# Patient Record
Sex: Female | Born: 1965
Health system: Southern US, Community
[De-identification: ages and names within clinical notes are randomized; demographics above are authoritative.]

## PROBLEM LIST (undated history)

## (undated) DIAGNOSIS — S31809A Unspecified open wound of unspecified buttock, initial encounter: Secondary | ICD-10-CM

## (undated) DIAGNOSIS — T7840XA Allergy, unspecified, initial encounter: Secondary | ICD-10-CM

## (undated) DIAGNOSIS — K219 Gastro-esophageal reflux disease without esophagitis: Secondary | ICD-10-CM

## (undated) DIAGNOSIS — E785 Hyperlipidemia, unspecified: Secondary | ICD-10-CM

## (undated) DIAGNOSIS — F419 Anxiety disorder, unspecified: Secondary | ICD-10-CM

## (undated) DIAGNOSIS — I10 Essential (primary) hypertension: Secondary | ICD-10-CM

## (undated) DIAGNOSIS — R51 Headache: Secondary | ICD-10-CM

## (undated) DIAGNOSIS — D509 Iron deficiency anemia, unspecified: Secondary | ICD-10-CM

## (undated) HISTORY — DX: Essential (primary) hypertension: I10

## (undated) HISTORY — PX: BREAST EXCISIONAL BIOPSY: SUR124

## (undated) HISTORY — PX: BREAST SURGERY: SHX581

## (undated) HISTORY — DX: Gastro-esophageal reflux disease without esophagitis: K21.9

## (undated) HISTORY — PX: TUBAL LIGATION: SHX77

## (undated) HISTORY — PX: LAPAROSCOPIC ENDOMETRIOSIS FULGURATION: SUR769

## (undated) HISTORY — PX: CYST EXCISION: SHX5701

## (undated) HISTORY — DX: Hyperlipidemia, unspecified: E78.5

## (undated) HISTORY — PX: BREAST CYST EXCISION: SHX579

## (undated) HISTORY — DX: Allergy, unspecified, initial encounter: T78.40XA

## (undated) HISTORY — PX: POLYPECTOMY: SHX149

## (undated) HISTORY — DX: Iron deficiency anemia, unspecified: D50.9

## (undated) HISTORY — DX: Anxiety disorder, unspecified: F41.9

---

## 1997-05-14 ENCOUNTER — Encounter: Admission: RE | Admit: 1997-05-14 | Discharge: 1997-08-12 | Payer: Self-pay | Admitting: Internal Medicine

## 1998-05-26 ENCOUNTER — Emergency Department (HOSPITAL_COMMUNITY): Admission: EM | Admit: 1998-05-26 | Discharge: 1998-05-26 | Payer: Self-pay | Admitting: Emergency Medicine

## 1998-05-27 ENCOUNTER — Encounter: Payer: Self-pay | Admitting: Emergency Medicine

## 1998-05-27 ENCOUNTER — Ambulatory Visit (HOSPITAL_COMMUNITY): Admission: RE | Admit: 1998-05-27 | Discharge: 1998-05-27 | Payer: Self-pay | Admitting: Emergency Medicine

## 1998-11-06 ENCOUNTER — Other Ambulatory Visit: Admission: RE | Admit: 1998-11-06 | Discharge: 1998-11-06 | Payer: Self-pay | Admitting: Nephrology

## 1999-05-16 ENCOUNTER — Encounter: Payer: Self-pay | Admitting: Internal Medicine

## 1999-05-16 ENCOUNTER — Ambulatory Visit (HOSPITAL_COMMUNITY): Admission: RE | Admit: 1999-05-16 | Discharge: 1999-05-16 | Payer: Self-pay | Admitting: Internal Medicine

## 2000-03-26 ENCOUNTER — Ambulatory Visit (HOSPITAL_COMMUNITY): Admission: RE | Admit: 2000-03-26 | Discharge: 2000-03-26 | Payer: Self-pay | Admitting: Internal Medicine

## 2000-03-26 ENCOUNTER — Encounter: Payer: Self-pay | Admitting: Internal Medicine

## 2000-03-29 ENCOUNTER — Ambulatory Visit (HOSPITAL_COMMUNITY): Admission: RE | Admit: 2000-03-29 | Discharge: 2000-03-29 | Payer: Self-pay | Admitting: Internal Medicine

## 2001-04-15 ENCOUNTER — Other Ambulatory Visit: Admission: RE | Admit: 2001-04-15 | Discharge: 2001-04-15 | Payer: Self-pay | Admitting: Internal Medicine

## 2001-04-20 ENCOUNTER — Encounter: Admission: RE | Admit: 2001-04-20 | Discharge: 2001-05-20 | Payer: Self-pay | Admitting: Occupational Medicine

## 2001-05-19 ENCOUNTER — Emergency Department (HOSPITAL_COMMUNITY): Admission: EM | Admit: 2001-05-19 | Discharge: 2001-05-19 | Payer: Self-pay | Admitting: Emergency Medicine

## 2002-04-21 ENCOUNTER — Other Ambulatory Visit: Admission: RE | Admit: 2002-04-21 | Discharge: 2002-04-21 | Payer: Self-pay | Admitting: Internal Medicine

## 2002-05-03 ENCOUNTER — Encounter: Admission: RE | Admit: 2002-05-03 | Discharge: 2002-05-03 | Payer: Self-pay | Admitting: Internal Medicine

## 2002-05-03 ENCOUNTER — Encounter: Payer: Self-pay | Admitting: Internal Medicine

## 2004-03-03 ENCOUNTER — Other Ambulatory Visit: Admission: RE | Admit: 2004-03-03 | Discharge: 2004-03-03 | Payer: Self-pay | Admitting: Internal Medicine

## 2004-03-28 ENCOUNTER — Other Ambulatory Visit: Admission: RE | Admit: 2004-03-28 | Discharge: 2004-03-28 | Payer: Self-pay | Admitting: Gynecology

## 2004-06-25 ENCOUNTER — Encounter (INDEPENDENT_AMBULATORY_CARE_PROVIDER_SITE_OTHER): Payer: Self-pay | Admitting: Specialist

## 2004-06-25 ENCOUNTER — Ambulatory Visit (HOSPITAL_COMMUNITY): Admission: RE | Admit: 2004-06-25 | Discharge: 2004-06-25 | Payer: Self-pay | Admitting: Gynecology

## 2004-06-25 HISTORY — PX: HYSTEROSCOPY WITH D & C: SHX1775

## 2004-09-17 ENCOUNTER — Other Ambulatory Visit: Admission: RE | Admit: 2004-09-17 | Discharge: 2004-09-17 | Payer: Self-pay | Admitting: Gynecology

## 2005-03-23 ENCOUNTER — Ambulatory Visit (HOSPITAL_COMMUNITY): Admission: RE | Admit: 2005-03-23 | Discharge: 2005-03-23 | Payer: Self-pay | Admitting: Gynecology

## 2005-05-07 ENCOUNTER — Ambulatory Visit (HOSPITAL_COMMUNITY): Admission: RE | Admit: 2005-05-07 | Discharge: 2005-05-07 | Payer: Self-pay | Admitting: Internal Medicine

## 2005-05-08 ENCOUNTER — Ambulatory Visit (HOSPITAL_COMMUNITY): Admission: RE | Admit: 2005-05-08 | Discharge: 2005-05-08 | Payer: Self-pay | Admitting: Internal Medicine

## 2007-02-03 ENCOUNTER — Ambulatory Visit: Payer: Self-pay | Admitting: Cardiology

## 2007-02-04 ENCOUNTER — Ambulatory Visit (HOSPITAL_COMMUNITY): Admission: RE | Admit: 2007-02-04 | Discharge: 2007-02-04 | Payer: Self-pay | Admitting: Internal Medicine

## 2007-11-25 ENCOUNTER — Ambulatory Visit: Payer: Self-pay | Admitting: Internal Medicine

## 2007-11-25 ENCOUNTER — Other Ambulatory Visit: Admission: RE | Admit: 2007-11-25 | Discharge: 2007-11-25 | Payer: Self-pay | Admitting: Internal Medicine

## 2008-02-16 ENCOUNTER — Ambulatory Visit: Payer: Self-pay | Admitting: Internal Medicine

## 2008-02-20 ENCOUNTER — Ambulatory Visit: Payer: Self-pay | Admitting: Oncology

## 2008-02-23 ENCOUNTER — Encounter: Admission: RE | Admit: 2008-02-23 | Discharge: 2008-02-23 | Payer: Self-pay | Admitting: Internal Medicine

## 2008-03-01 ENCOUNTER — Ambulatory Visit (HOSPITAL_COMMUNITY): Admission: RE | Admit: 2008-03-01 | Discharge: 2008-03-01 | Payer: Self-pay | Admitting: Internal Medicine

## 2008-03-01 ENCOUNTER — Ambulatory Visit: Payer: Self-pay | Admitting: Internal Medicine

## 2008-05-04 ENCOUNTER — Ambulatory Visit: Payer: Self-pay | Admitting: Internal Medicine

## 2008-06-18 ENCOUNTER — Ambulatory Visit: Payer: Self-pay | Admitting: Oncology

## 2008-06-20 LAB — CBC WITH DIFFERENTIAL/PLATELET
HCT: 29.4 % — ABNORMAL LOW (ref 34.8–46.6)
HGB: 9.7 g/dL — ABNORMAL LOW (ref 11.6–15.9)
LYMPH%: 21.7 % (ref 14.0–49.7)
MONO#: 0.4 10*3/uL (ref 0.1–0.9)
MONO%: 10.2 % (ref 0.0–14.0)
NEUT#: 2.7 10*3/uL (ref 1.5–6.5)
NEUT%: 66.8 % (ref 38.4–76.8)
Platelets: 259 10*3/uL (ref 145–400)
WBC: 4 10*3/uL (ref 3.9–10.3)

## 2008-09-18 ENCOUNTER — Ambulatory Visit: Payer: Self-pay | Admitting: Internal Medicine

## 2008-10-12 ENCOUNTER — Ambulatory Visit: Payer: Self-pay | Admitting: Oncology

## 2008-10-16 LAB — CBC WITH DIFFERENTIAL/PLATELET
Basophils Absolute: 0 10*3/uL (ref 0.0–0.1)
EOS%: 1.7 % (ref 0.0–7.0)
HCT: 36.1 % (ref 34.8–46.6)
HGB: 12.2 g/dL (ref 11.6–15.9)
MONO#: 0.4 10*3/uL (ref 0.1–0.9)
MONO%: 13.2 % (ref 0.0–14.0)
NEUT#: 1.3 10*3/uL — ABNORMAL LOW (ref 1.5–6.5)
NEUT%: 43.7 % (ref 38.4–76.8)
lymph#: 1.2 10*3/uL (ref 0.9–3.3)

## 2008-10-16 LAB — COMPREHENSIVE METABOLIC PANEL
ALT: 8 U/L (ref 0–35)
AST: 11 U/L (ref 0–37)
Alkaline Phosphatase: 39 U/L (ref 39–117)
CO2: 23 mEq/L (ref 19–32)
Chloride: 107 mEq/L (ref 96–112)
Creatinine, Ser: 0.84 mg/dL (ref 0.40–1.20)
Sodium: 137 mEq/L (ref 135–145)
Total Bilirubin: 0.5 mg/dL (ref 0.3–1.2)
Total Protein: 6.3 g/dL (ref 6.0–8.3)

## 2008-10-16 LAB — IRON AND TIBC: UIBC: 279 ug/dL

## 2008-11-30 ENCOUNTER — Ambulatory Visit: Payer: Self-pay | Admitting: Internal Medicine

## 2009-03-14 ENCOUNTER — Ambulatory Visit: Payer: Self-pay | Admitting: Internal Medicine

## 2009-03-14 ENCOUNTER — Encounter: Payer: Self-pay | Admitting: Cardiology

## 2009-03-22 ENCOUNTER — Ambulatory Visit: Payer: Self-pay | Admitting: Cardiology

## 2009-03-22 DIAGNOSIS — R002 Palpitations: Secondary | ICD-10-CM | POA: Insufficient documentation

## 2009-03-22 DIAGNOSIS — I10 Essential (primary) hypertension: Secondary | ICD-10-CM

## 2009-03-22 DIAGNOSIS — J45909 Unspecified asthma, uncomplicated: Secondary | ICD-10-CM | POA: Insufficient documentation

## 2009-03-22 HISTORY — DX: Essential (primary) hypertension: I10

## 2009-03-29 ENCOUNTER — Telehealth: Payer: Self-pay | Admitting: Cardiology

## 2009-03-29 ENCOUNTER — Encounter: Payer: Self-pay | Admitting: Cardiology

## 2009-04-04 ENCOUNTER — Telehealth: Payer: Self-pay | Admitting: Cardiology

## 2009-04-10 ENCOUNTER — Telehealth: Payer: Self-pay | Admitting: Cardiology

## 2009-04-18 ENCOUNTER — Encounter: Payer: Self-pay | Admitting: Cardiology

## 2009-04-19 ENCOUNTER — Ambulatory Visit: Payer: Self-pay | Admitting: Cardiology

## 2009-07-11 ENCOUNTER — Encounter: Admission: RE | Admit: 2009-07-11 | Discharge: 2009-07-11 | Payer: Self-pay | Admitting: Internal Medicine

## 2009-11-04 ENCOUNTER — Emergency Department (HOSPITAL_COMMUNITY): Admission: EM | Admit: 2009-11-04 | Discharge: 2009-11-04 | Payer: Self-pay | Admitting: Family Medicine

## 2009-11-08 ENCOUNTER — Ambulatory Visit: Payer: Self-pay | Admitting: Internal Medicine

## 2009-12-06 ENCOUNTER — Ambulatory Visit: Payer: Self-pay | Admitting: Internal Medicine

## 2009-12-27 ENCOUNTER — Ambulatory Visit: Payer: Self-pay | Admitting: Internal Medicine

## 2010-02-16 ENCOUNTER — Encounter: Payer: Self-pay | Admitting: Internal Medicine

## 2010-02-25 NOTE — Miscellaneous (Signed)
Summary: MEDS  Clinical Lists Changes  Medications: Added new medication of PRINZIDE 20-12.5 MG TABS (LISINOPRIL-HYDROCHLOROTHIAZIDE) 1 TAB once daily Added new medication of NORVASC 5 MG TABS (AMLODIPINE BESYLATE) 1 TAB once daily Added new medication of * ALBUTEROL INHALER AS DIRECTED

## 2010-02-25 NOTE — Progress Notes (Signed)
Summary: Patients At Home Vitals   Patients At Home Vitals   Imported By: Roderic Ovens 07/24/2009 14:33:13  _____________________________________________________________________  External Attachment:    Type:   Image     Comment:   External Document

## 2010-02-25 NOTE — Assessment & Plan Note (Signed)
Summary: bp check  Nurse Visit   Vital Signs:  Patient profile:   45 year old female Pulse rate:   58 / minute Pulse rhythm:   regular BP sitting:   142 / 98  (left arm) Cuff size:   large  CC:  The pt is here for a BP f/u. She reports her bp on 3/1 was 148/90 and 3/2 140/92/ 3/3 132/90. She c/o a headache today- but she states she just found out her father has recurrent cancer. I discussed this with Dr. Juanda Chance- orders received for the pt to stop norvasc and start labetolol 200mg  bid - bp check in 1 week..   Allergies: 1)  ! * Latex Prescriptions: LABETALOL HCL 200 MG TABS (LABETALOL HCL) one tab by mouth two times a day  #180 x 3   Entered by:   Sherri Rad, RN, BSN   Authorized by:   Lenoria Farrier, MD, [facility]   Signed by:   Sherri Rad, RN, BSN on 03/29/2009   Method used:   Electronically to        [facility]* (retail)       [address]       [address] Shipping/mailing       [place], [place]  [address]       Ph: [phone]       Fax: [phone]   RxID:   [id number]    Patient Instructions: 1)  Your physician has recommended you make the following change in your medication: 1) STOP norvasc, 2) Start Labetolol 200mg  two times a day. 2)  You need to come back next week for another bp check.

## 2010-02-25 NOTE — Assessment & Plan Note (Signed)
Summary: np6/dx:hypertention   Visit Type:  Initial Consult Primary Provider:  Dr Lenord Fellers  CC:  hypertension.  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 Her husband also works at [employer] as a tech with the sleep studies. She is referred by Dr. Lenord Fellers for management of hypertension in the setting of her trying to get pregnant.  She has a history of hypertension for a number of years. She also has a history of palpitations. She was evaluated with an echocardiogram at [facility] in January 2009 which was normal except for mild LVH. She had a Holter monitor which showed isolated PVCs and APCs and one short run of SVT.  She had been on labetalol and Aldomet in the past when there was concern about her trying to get pregnant. This was not thought to be an issue recently and she has been treated with Norvasc 5 mg daily and Prinzide 20-12.5 mg daily. She comes in today to get off these drugs which potentially could be toxic to the fetus if she gets pregnant and began on alternative drugs it won't affect her pregnancy.  She has had no chest pain or shortness of breath.  Problems Prior to Update: 1)  Iron Deficiency Anemia  (ICD-280.9) 2)  Benign Neutropenia  () 3)  Asthma, Unspecified, Unspecified Status  (ICD-493.90)  Current Medications (verified): 1)  Prinzide 20-12.5 Mg Tabs (Lisinopril-Hydrochlorothiazide) .Marland Kitchen.. 1 Tab Once Daily 2)  Norvasc 5 Mg Tabs (Amlodipine Besylate) .Marland Kitchen.. 1 Tab Once Daily 3)  Albuterol Inhaler .... As Directed 4)  Advair Inhaler .... As Directed 5)  Allegra 60 Mg Tabs (Fexofenadine Hcl) .... As Needed 6)  Pre Natal Vit .Marland Kitchen.. 1 Tab Once Daily(Anemia)  Allergies (verified): 1)  ! * Latex  Past History:  Past Medical History: hypertension Tubal ligation Removal of cyst from the breast History of asthma but not on any bronchodilators at present.  Family History: both her mother and father  are alive. There is no family history of cardiac disease.  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 She has 2 children by her first marriage age 37 and 73 I believe. She does not smoke.  Review of Systems       ROS is negative except as outlined in HPI.   Vital Signs:  Patient profile:   45 year old female Height:      64 inches Weight:      215 pounds BMI:     37.04 BP sitting:   144 / 101  (left arm) Cuff size:   large  Vitals Entered By: Burnett Kanaris, CNA (March 22, 2009 4:04 PM)  Physical Exam  Additional Exam:  Gen. Well-nourished, in no distress Head: Normocephalic    Eyes: PERRLA/EOM intact; conjunctiva and lids normal Neck: No JVD, thyroid not enlarged, no carotid bruits Lungs: No tachypnea, clear w/o rales, rhonchi or wheezes CV: Rhythm regular, PMI not displaced,  sounds  nl, no murmurs or gallops, no edema, pulses nl UE and LE Abd: BS nl, abd soft and non-tender w/o masses or hepatosplenomegaly MS: No deformities Neuro: no focal sns Skin: no lesions Psych: nl affect    Impression & Recommendations:  Problem # 1:  ESSENTIAL HYPERTENSION, BENIGN (ICD-401.1) The main issue is finding medications that will control her hypertension without affecting her potential pregnancy. Candidates for safe drugs include hydralazine, labetalol, and Aldomet.I think it will take at  least 2 drugs to get her blood pressure controlled since it's somewhat elevated on 2 drugs today. Although it's elevated today she says it's been running better at home or in a hospital and she has it checked. We will stop the Prinzide and began a Presalin 25 mg q.i.d. We'll have her keep her record of her blood pressures over the next week and come back for blood pressure check in one week. If her blood pressures are under regional control we will stop the Norvasc and start her on labetalol 200 mg b.i.d. The following medications were removed from the medication list:     Prinzide 20-12.5 Mg Tabs (Lisinopril-hydrochlorothiazide) .Marland Kitchen... 1 tab once daily Her updated medication list for this problem includes:    Norvasc 5 Mg Tabs (Amlodipine besylate) .Marland Kitchen... 1 tab once daily    Hydralazine Hcl 25 Mg Tabs (Hydralazine hcl) .Marland Kitchen... Take one tablet by mouth four times a day  Orders: EKG w/ Interpretation (93000)  Problem # 2:  PALPITATIONS (ICD-785.1) She has had symptoms of palpitations and has documented PVCs and APCs on Holter monitoring and one short run of SVT. I don't think any of this will require treatment although if we add labetalol this may help. She has no major structural heart disease. She has mild LVH on her echocardiogram associated with her hypertension. The following medications were removed from the medication list:    Prinzide 20-12.5 Mg Tabs (Lisinopril-hydrochlorothiazide) .Marland Kitchen... 1 tab once daily Her updated medication list for this problem includes:    Norvasc 5 Mg Tabs (Amlodipine besylate) .Marland Kitchen... 1 tab once daily  Patient Instructions: 1)  Your physician recommends that you schedule a follow-up appointment in: 4 weeks. 2)  Your physician has recommended you make the following change in your medication: 1) STOP Prinizide, 2) Start Hydralazine 25mg  four times daily ( you may take this with meals and at bedtime). 3) Continue Norvasc for 1 week. 3)  You need to see Dr. Regino Schultze nurse, Sherri Rad, for a blood pressure check in 1 week.  `

## 2010-02-25 NOTE — Progress Notes (Signed)
Summary: BP readings  Phone Note Call from Patient Call back at [phone] (cell)/ [phone]   Caller: Patient Summary of Call: The pt called today and stated she would be faxing her bp readings. I have recieved her readings and they are as follows: 03/31/09 @ 2:15pm- 126/94 HR- 47 04/01/09 @ 6:06pm- 126/93 HR- 52 04/02/09 @ 8:30pm- 139/95 HR- 55 04/03/09 @ 6:25pm- 126/92 HR- 64 The pt denies dizziness/ lightheadedness, but states she does feel "funny" sometimes.  The pt is on hydralazine 25mg  qid & we recently started her on labetolol 200mg  two times a day on 03/29/09. I will review with Dr. Juanda Chance and call the pt back with any changes.

## 2010-02-25 NOTE — Consult Note (Signed)
Summary: [facility]  [facility]   Imported By: Marylou Mccoy 03/21/2009 12:40:04  _____________________________________________________________________  External Attachment:    Type:   Image     Comment:   External Document

## 2010-02-25 NOTE — Progress Notes (Signed)
Summary: BP f/u   ---- Converted from flag ---- ---- 04/06/2009 12:28 AM, Lenoria Farrier, MD, [facility] wrote: I think the bp readings are pretty good.  Continue same and give [place] another 2 weeks of readings. BB  ---- 04/04/2009 12:28 PM, Sherri Rad, RN, BSN wrote: Please review the pt's bp readings and comments from 04/04/09 phone note. Advise if any changes. Thanks. ------------------------------  Phone Note Outgoing Call   Call placed by: Sherri Rad, RN, BSN,  April 10, 2009 5:22 PM Call placed to: Patient Summary of Call: I left a message for the pt to call. Sherri Rad, RN, BSN  April 10, 2009 5:22 PM  I spoke with the pt today. I explained that Dr. Juanda Chance had reviewed her BP readings and was pleased with her results. He has encourage her to take her readings for 2 more weeks and call us with the results and how she is feeling. She is agreeable with this.  Initial call taken by: Sherri Rad, RN, BSN,  April 11, 2009 8:50 AM

## 2010-02-25 NOTE — Assessment & Plan Note (Signed)
Summary: f45m   Visit Type:  1 mo f/u Primary Provider:  Dr Lenord Fellers  CC:  BP doing O.K. since last visit.  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 She and her husband, who also works at [employer], had been trying to get pregnant and Dr. Lenord Fellers was having some difficulty controlling her blood pressure so she referred her to [place] for management with drugs that would not be risky if she became pregnant. We transition her drugs to labetalol and hydralazine and she has done quite well with this. She is Blood pressure readings at home and had been in the range of 124 237 systolic and 73-89 diastolic.  Her chief problems are related to weight gain. She is up to 219 pounds which is a weight gain of over 30 pounds. She also has been having irregular periods. Dr. Lenord Fellers recently did blood tests and also a pregnancy test.  Problems Prior to Update: 1)  Palpitations  (ICD-785.1) 2)  Essential Hypertension, Benign  (ICD-401.1) 3)  Iron Deficiency Anemia  (ICD-280.9) 4)  Benign Neutropenia  () 5)  Asthma, Unspecified, Unspecified Status  (ICD-493.90)  Current Medications (verified): 1)  Labetalol Hcl 200 Mg Tabs (Labetalol Hcl) .... One Tab By Mouth Two Times A Day 2)  Albuterol Inhaler .... As Directed 3)  Advair Inhaler .... As Directed 4)  Allegra 60 Mg Tabs (Fexofenadine Hcl) .... As Needed 5)  Pre Natal Vit .Marland Kitchen.. 1 Tab Once Daily(Anemia) 6)  Hydralazine Hcl 25 Mg Tabs (Hydralazine Hcl) .... Take One Tablet By Mouth Four Times A Day  Allergies: 1)  ! * Latex  Past History:  Past Medical History: Reviewed history from 03/22/2009 and no changes required. hypertension Tubal ligation Removal of cyst from the breast History of asthma but not on any bronchodilators at present.  Review of Systems       ROS is negative except as outlined in HPI.   Vital Signs:  Patient profile:   45 year old  female Height:      64 inches Weight:      219 pounds Pulse rate:   61 / minute BP sitting:   135 / 80  (left arm) Cuff size:   large  Vitals Entered By: Oswald Hillock (April 19, 2009 4:16 PM)  Physical Exam  Additional Exam:  Gen. Well-nourished, in no distress   Neck: No JVD, thyroid not enlarged, no carotid bruits Lungs: No tachypnea, clear without rales, rhonchi or wheezes Cardiovascular: Rhythm regular, PMI not displaced,  heart sounds  normal, no murmurs or gallops, no peripheral edema, pulses normal in all 4 extremities. Abdomen: BS normal, abdomen soft and non-tender without masses or organomegaly, no hepatosplenomegaly. MS: No deformities, no cyanosis or clubbing   Neuro:  No focal sns   Skin:  no lesions    Impression & Recommendations:  Problem # 1:  ESSENTIAL HYPERTENSION, BENIGN (ICD-401.1) her blood pressure now appears well controlled on her current medications and we will continue her current treatment.  She has concerns about her weight gain and her irregular periods and I suggested that she continue to work with Dr. Lenord Fellers on these problems.  Her updated medication list for this problem includes:    Labetalol Hcl 200 Mg Tabs (Labetalol hcl) ..... One tab by mouth two times a day    Hydralazine Hcl 25 Mg Tabs (Hydralazine hcl) .Marland Kitchen... Take one tablet by mouth four times a  day  Her updated medication list for this problem includes:    Labetalol Hcl 200 Mg Tabs (Labetalol hcl) ..... One tab by mouth two times a day    Hydralazine Hcl 25 Mg Tabs (Hydralazine hcl) .Marland Kitchen... Take one tablet by mouth four times a day  Patient Instructions: 1)  We will see you back on an as needed basis.

## 2010-02-25 NOTE — Progress Notes (Signed)
Summary: PT HAVE COLD WANT TO KNOW  WHAT SHE CAN TAKE FOR IT  Phone Note Call from Patient Call back at [phone]   Summary of Call: PT FEEL LIKE SHE IS GETTING SICK (COLD) WANT TO KNOW WHAT SHE CAN TAKE. Initial call taken by: Judie Grieve,  March 29, 2009 11:34 AM  Follow-up for Phone Call        I explained to the pt that she can take Coricidin for her symptoms.  Follow-up by: Sherri Rad, RN, BSN,  March 29, 2009 12:59 PM

## 2010-04-03 ENCOUNTER — Ambulatory Visit (INDEPENDENT_AMBULATORY_CARE_PROVIDER_SITE_OTHER): Payer: Commercial Managed Care - PPO | Admitting: Internal Medicine

## 2010-04-03 DIAGNOSIS — L02239 Carbuncle of trunk, unspecified: Secondary | ICD-10-CM

## 2010-04-03 DIAGNOSIS — J45909 Unspecified asthma, uncomplicated: Secondary | ICD-10-CM

## 2010-04-03 DIAGNOSIS — J069 Acute upper respiratory infection, unspecified: Secondary | ICD-10-CM

## 2010-06-06 ENCOUNTER — Ambulatory Visit (INDEPENDENT_AMBULATORY_CARE_PROVIDER_SITE_OTHER): Payer: Commercial Managed Care - PPO | Admitting: Internal Medicine

## 2010-06-06 ENCOUNTER — Encounter: Payer: Self-pay | Admitting: Internal Medicine

## 2010-06-06 VITALS — BP 118/80 | HR 72 | Temp 98.2°F | Ht 68.0 in | Wt 198.0 lb

## 2010-06-06 DIAGNOSIS — Z0000 Encounter for general adult medical examination without abnormal findings: Secondary | ICD-10-CM

## 2010-06-06 LAB — POCT URINALYSIS DIPSTICK
Glucose, UA: NEGATIVE
Protein, UA: NEGATIVE
pH, UA: 5

## 2010-06-06 NOTE — Patient Instructions (Addendum)
Take meds as directed. Is enrolled in [facility] at [facility] for BP control and weight loss. Has lost 5 pounds more since Dec 2011.  See in 6 months.

## 2010-06-06 NOTE — Progress Notes (Signed)
  Subjective:    Patient ID: Jodi Bates, female    DOB: 1965/11/15, 45 y.o.   MRN: [id number]  HPI 45 year old B female with HTN , asthma, Fe def anemia, irreg menses for CPE and evaluation of medical problems.    Review of Systems  HENT: Positive for sneezing. Negative for rhinorrhea and postnasal drip.   Respiratory: Positive for cough, chest tightness and shortness of breath. Negative for wheezing.   Cardiovascular: Negative for chest pain, palpitations and leg swelling.  Genitourinary: Positive for menstrual problem.       Objective:   Physical Exam  Constitutional: She is oriented to person, place, and time. She appears well-nourished. No distress.  HENT:  Head: Normocephalic.  Right Ear: External ear normal.  Left Ear: External ear normal.  Eyes: Conjunctivae and EOM are normal. Pupils are equal, round, and reactive to light. Right eye exhibits no discharge. Left eye exhibits no discharge. No scleral icterus.  Neck: No JVD present. No tracheal deviation present. No thyromegaly present.  Cardiovascular: Normal rate, regular rhythm and normal heart sounds.  Exam reveals no gallop.   No murmur heard. Pulmonary/Chest: Effort normal and breath sounds normal. No stridor. No respiratory distress. She has no wheezes. She has no rales.  Abdominal: She exhibits no distension and no mass. There is no tenderness. There is no rebound and no guarding.  Musculoskeletal: She exhibits no edema and no tenderness.  Lymphadenopathy:    She has no cervical adenopathy.  Neurological: She is oriented to person, place, and time. She has normal reflexes. She displays normal reflexes. No cranial nerve deficit. She exhibits normal muscle tone. Coordination normal.  Skin: No rash noted. No erythema.  Psychiatric: She has a normal mood and affect. Her behavior is normal. Judgment and thought content normal.          Assessment & Plan:  1- Asthma- stable-  pt. Has had some trouble this spring  but does not feel like she needs Prednisone at this point. 2- HTN stable on current regimen 3- Hx of benign neutropenia 3- Iron deficiency anemia not currently taking iron supplement and has had issues with compliance with this med

## 2010-06-13 NOTE — Op Note (Signed)
NAMEKERRY, ODONOHUE                ACCOUNT NO.:  [id number]   MEDICAL RECORD NO.:  [id number]          PATIENT TYPE:  AMB   LOCATION:  DAY                          FACILITY[facility]  [facility]   PHYSICIAN:  Howard C. Mezer, M.D.  DATE OF BIRTH:  August 31, 1965   DATE OF PROCEDURE:  06/25/2004  DATE OF DISCHARGE:                                 OPERATIVE REPORT   INDICATIONS FOR PROCEDURE:  The patient is a 45 year old female who on  saline ultrasound had filling defects of suggestive endometrial polyps who  is admitted for hysteroscopy D&C.   PROCEDURE:  With the patient in the lithotomy position, he was prepped and  draped in the routine fashion.  The cervix was dilated without difficulty  and the uterus was sounded to 11 cm.  The fundus was verified in multiple  directions and there was no concern on this during the dilation or the  sounding of perforation.  The hysteroscope was introduced and saline was  used as the distention medium.  There was some difficulty maintaining a  distention of the endometrial cavity as the distention medium was leaking  through the cervix.  The endocervical canal appeared to be normal.  There  were multiple polypoid structures within the uterus that were photo  documented although the photo documentation was less demonstrative than  usual.  No gross fibroids were seen.  The hysteroscope was removed and the  cavity sharply curetted productive of a very large amount of tissue and  polypoid tissue.  Randall stone forceps were introduced and a small amount  of additional tissue was identified.  The hysteroscope was reintroduced and  visualization was suboptimal but no additional tissue was noted.  During the  curettage, the cavity felt quite irregular.  There was minimal bleeding at  the end of the procedure.   ESTIMATED BLOOD LOSS:  Less than 20 mL.   COUNTS:  Sponge, instrument and needle counts were correct.   DISPOSITION:  The patient tolerated the procedure well  and taken to the  recovery room in satisfactory condition.      HCM/MEDQ  D:  06/25/2004  T:  06/25/2004  Job:  [id number]   cc:   Dr. Reesa Chew  [facility]  [address]  [place] [address]

## 2010-07-31 ENCOUNTER — Other Ambulatory Visit: Payer: Self-pay | Admitting: Internal Medicine

## 2010-07-31 DIAGNOSIS — Z1231 Encounter for screening mammogram for malignant neoplasm of breast: Secondary | ICD-10-CM

## 2010-08-11 ENCOUNTER — Ambulatory Visit
Admission: RE | Admit: 2010-08-11 | Discharge: 2010-08-11 | Disposition: A | Discharge status: Home / Self Care | Payer: Commercial Managed Care - PPO | Source: Ambulatory Visit | Attending: Internal Medicine | Admitting: Internal Medicine

## 2010-08-11 DIAGNOSIS — Z1231 Encounter for screening mammogram for malignant neoplasm of breast: Secondary | ICD-10-CM

## 2010-11-27 ENCOUNTER — Other Ambulatory Visit: Payer: Self-pay | Admitting: Internal Medicine

## 2010-11-27 NOTE — Telephone Encounter (Signed)
See when OV due.

## 2010-11-27 NOTE — Telephone Encounter (Signed)
Patient is due for a 6 month followup in November. Will call to remind her

## 2010-12-01 ENCOUNTER — Other Ambulatory Visit: Payer: Self-pay | Admitting: Internal Medicine

## 2010-12-01 NOTE — Telephone Encounter (Signed)
This request came in last week and pt may be due for OV

## 2010-12-11 ENCOUNTER — Ambulatory Visit (INDEPENDENT_AMBULATORY_CARE_PROVIDER_SITE_OTHER): Payer: Commercial Managed Care - PPO | Admitting: Internal Medicine

## 2010-12-11 ENCOUNTER — Other Ambulatory Visit (HOSPITAL_COMMUNITY)
Admission: RE | Admit: 2010-12-11 | Discharge: 2010-12-11 | Disposition: A | Discharge status: Home / Self Care | Payer: 59 | Source: Ambulatory Visit | Attending: Internal Medicine | Admitting: Internal Medicine

## 2010-12-11 ENCOUNTER — Encounter: Payer: Self-pay | Admitting: Internal Medicine

## 2010-12-11 ENCOUNTER — Other Ambulatory Visit: Payer: Self-pay | Admitting: Internal Medicine

## 2010-12-11 DIAGNOSIS — Z01419 Encounter for gynecological examination (general) (routine) without abnormal findings: Secondary | ICD-10-CM | POA: Insufficient documentation

## 2010-12-11 DIAGNOSIS — N912 Amenorrhea, unspecified: Secondary | ICD-10-CM

## 2010-12-11 DIAGNOSIS — I10 Essential (primary) hypertension: Secondary | ICD-10-CM

## 2010-12-11 DIAGNOSIS — Z124 Encounter for screening for malignant neoplasm of cervix: Secondary | ICD-10-CM

## 2010-12-11 DIAGNOSIS — J45909 Unspecified asthma, uncomplicated: Secondary | ICD-10-CM

## 2010-12-12 LAB — CBC WITH DIFFERENTIAL/PLATELET
Eosinophils Absolute: 0.1 10*3/uL (ref 0.0–0.7)
Hemoglobin: 12.5 g/dL (ref 12.0–15.0)
Lymphocytes Relative: 45 % (ref 12–46)
Lymphs Abs: 1.6 10*3/uL (ref 0.7–4.0)
MCHC: 33 g/dL (ref 30.0–36.0)
Monocytes Relative: 13 % — ABNORMAL HIGH (ref 3–12)
Neutro Abs: 1.4 10*3/uL — ABNORMAL LOW (ref 1.7–7.7)
Neutrophils Relative %: 40 % — ABNORMAL LOW (ref 43–77)
RBC: 4.36 MIL/uL (ref 3.87–5.11)

## 2010-12-12 LAB — HCG, QUANTITATIVE, PREGNANCY: hCG, Beta Chain, Quant, S: <2 m[IU]/mL

## 2010-12-12 LAB — FOLLICLE STIMULATING HORMONE: FSH: 9.4 m[IU]/mL

## 2010-12-17 ENCOUNTER — Ambulatory Visit (INDEPENDENT_AMBULATORY_CARE_PROVIDER_SITE_OTHER): Payer: Commercial Managed Care - PPO | Admitting: Internal Medicine

## 2010-12-17 ENCOUNTER — Encounter: Payer: Self-pay | Admitting: Internal Medicine

## 2010-12-17 VITALS — BP 124/82 | HR 80 | Temp 100.5°F | Wt 198.0 lb

## 2010-12-17 DIAGNOSIS — J45909 Unspecified asthma, uncomplicated: Secondary | ICD-10-CM

## 2010-12-17 DIAGNOSIS — N912 Amenorrhea, unspecified: Secondary | ICD-10-CM

## 2010-12-17 NOTE — Progress Notes (Signed)
  Subjective:    Patient ID: Jodi Bates, female    DOB: 1966-01-15, 45 y.o.   MRN: [id number]  HPI patient has had respiratory infection for several days but now has coughing , wheezing and shortness of breath. No shaking chills but has low-grade fever. Was given albuterol nebulizer treatment in the office today. Also was seen recently for amenorrhea. Not menopausal and not pregnant based on lab work. Is to take Provera 10 mg for 5 days to see if menses will start. If not is to see GYN physician.    Review of Systems     Objective:   Physical Exam HEENT exam: TMs are full but not red; pharynx is slightly injected without exudate; neck is supple without significant adenopathy; chest after albuterol treatment: moving air fairly well. No rales or wheezing        Assessment & Plan:  Asthmatic bronchitis  Amenorrhea  Plan: Biaxin 500 mg by mouth twice daily for 10 days; samples of Zutripro (5 doses) 1 teaspoon by mouth each bedtime; Sterapred DS 10 mg 6 day dosepak. Already sent to her prescription for Provera 10 mg (5 tablets (1 by mouth daily for 5 days. If menses does not start within 2 weeks he is to see GYN physician.

## 2010-12-17 NOTE — Patient Instructions (Signed)
Take samples of cough syrup 1 teaspoon at bedtime for 5 days; take prednisone as directed in dosepak for 6 days in tapering course; Biaxin 500 mg twice daily for 10 days. Once you are better start Provera 10 mg daily for 5 days for amenorrhea

## 2011-01-25 ENCOUNTER — Encounter: Payer: Self-pay | Admitting: Internal Medicine

## 2011-01-25 DIAGNOSIS — N912 Amenorrhea, unspecified: Secondary | ICD-10-CM | POA: Insufficient documentation

## 2011-01-25 NOTE — Patient Instructions (Addendum)
Continue same antihypertensive medications. We will notify you with results of FSH and serum pregnancy test. If these show no evidence of menopause or pregnancy, proceed with Provera 10 mg daily for 5 days

## 2011-01-25 NOTE — Progress Notes (Signed)
  Subjective:    Patient ID: Jodi Bates, female    DOB: [birth year], 45 y.o.   MRN: [id number]  HPI 45 year old black female with history of asthma and hypertension for six-month recheck. Has not had recent Pap smear and requests one today. Has had issues with amenorrhea. No recent menstrual period and she is not sure why. Has seen GYN physician in the past for infertility issues. Has basically given up trying to have another child at this point.    Review of Systems no complaints or problems today other than amenorrhea     Objective:   Physical Exam chest is clear; cardiac exam regular rate and rhythm normal S1 and S2; extremities without edema. Genitalia: NFEG; Pap smear taken; no significant vaginal discharge; no masses on bimanual exam.        Assessment & Plan:  Hypertension  Asthma  Amenorrhea  Plan: Patient will have serum hCG and FSH level. If these prove to be within normal limits we'll treat with Provera 10 mg daily for 5 days.  Okay to refill antihypertensive medication and asthma inhaler as needed. Return in 6 months for checkup

## 2011-02-05 ENCOUNTER — Ambulatory Visit (INDEPENDENT_AMBULATORY_CARE_PROVIDER_SITE_OTHER): Payer: 59 | Admitting: Internal Medicine

## 2011-02-05 ENCOUNTER — Encounter: Payer: Self-pay | Admitting: Internal Medicine

## 2011-02-05 VITALS — BP 114/84 | HR 80 | Temp 99.0°F | Ht 68.0 in | Wt 218.0 lb

## 2011-02-05 DIAGNOSIS — J45909 Unspecified asthma, uncomplicated: Secondary | ICD-10-CM

## 2011-03-23 NOTE — Progress Notes (Signed)
  Subjective:    Patient ID: Jodi Bates, female    DOB: [birth year], 46 y.o.   MRN: [id number]  HPI 46 year old black female with hypertension in today with URI symptoms. Has malaise fatigue headache coughing and sneezing. Some wheezing. History of asthma. No documented fever. Some chills. Symptoms present for 3 or 4 days. Thought initially she might have the flu because she became chilled. Did take influenza immunization at work.    Review of Systems     Objective:   Physical Exam HEENT exam: Pharynx slightly injected without exudate; TMs slightly full but not red; neck is supple without significant adenopathy; chest clear but has congested cough with deep inspiration        Assessment & Plan:  Asthmatic bronchitis  Possible influenza  Plan: Sterapred DS 10 mg 6 day dosepak; Levaquin 500 milligrams daily for 10 days.; Hycodan 8 ounces 1 teaspoon every 6 hours as needed for cough. Use inhalers as directed.

## 2011-03-23 NOTE — Patient Instructions (Signed)
Take medications as directed. Call if not better in 48-72 hours or sooner if worse.

## 2011-05-26 ENCOUNTER — Encounter: Payer: Self-pay | Admitting: Internal Medicine

## 2011-05-26 ENCOUNTER — Ambulatory Visit (INDEPENDENT_AMBULATORY_CARE_PROVIDER_SITE_OTHER): Payer: 59 | Admitting: Internal Medicine

## 2011-05-26 VITALS — BP 116/84 | HR 76 | Temp 98.7°F | Wt 201.0 lb

## 2011-05-26 DIAGNOSIS — H6500 Acute serous otitis media, unspecified ear: Secondary | ICD-10-CM

## 2011-05-26 DIAGNOSIS — J9801 Acute bronchospasm: Secondary | ICD-10-CM

## 2011-05-26 DIAGNOSIS — I10 Essential (primary) hypertension: Secondary | ICD-10-CM

## 2011-05-26 DIAGNOSIS — J40 Bronchitis, not specified as acute or chronic: Secondary | ICD-10-CM

## 2011-05-26 DIAGNOSIS — H6503 Acute serous otitis media, bilateral: Secondary | ICD-10-CM

## 2011-05-26 DIAGNOSIS — J45909 Unspecified asthma, uncomplicated: Secondary | ICD-10-CM

## 2011-05-26 NOTE — Patient Instructions (Signed)
Take prednisone dosepak as prescribed. Take Levaquin 500 milligrams daily for 10 days. Continue Advair and albuterol inhalers as needed. For acute attacks of bronchospasm use DuoNeb and home nebulizer 4 times daily.

## 2011-05-26 NOTE — Progress Notes (Signed)
  Subjective:    Patient ID: Jodi Bates, female    DOB: 1965-07-14, 46 y.o.   MRN: [id number]  HPI 46 year old black female with history of asthma and bronchitis. Has a 3 week history of URI symptoms. Has albuterol inhaler. Has had cough , some wheezing and shortness of breath. Pulse oximetry today is 99% on room air. She had some leftover prednisone from a previous prescription and took that for 5 days with some improvement. Cough sounds deep and congested. No fever or shaking chills.    Review of Systems     Objective:   Physical Exam deep congested cough noted in exam room. HEENT exam: TMs are full bilaterally. Pharynx slightly injected without exudate. Neck is supple without adenopathy. Chest clear to auscultation.        Assessment & Plan:  Bronchitis  Bronchospasm  History of asthma  History of hypertension  Bilateral acute serous otitis media  Plan: Patient will be prescribed a home nebulizer treatment to use duo neb 4 times daily as needed for acute asthma attacks. Sterapred DS 10 mg 6 day dosepak take as directed. Levaquin 500 milligrams daily for 10 days. Albuterol inhaler 2 sprays by mouth 4 times daily. Advair 250/50 one spray every 12 hours.

## 2011-05-28 ENCOUNTER — Other Ambulatory Visit: Payer: Self-pay

## 2011-05-28 MED ORDER — IPRATROPIUM-ALBUTEROL 0.5-2.5 (3) MG/3ML IN SOLN: 3.0000 mL | Freq: Four times a day (QID) | RESPIRATORY_TRACT | Status: DC | PRN

## 2011-06-01 ENCOUNTER — Other Ambulatory Visit: Payer: Self-pay

## 2011-06-01 MED ORDER — LISINOPRIL-HYDROCHLOROTHIAZIDE 20-25 MG PO TABS: 1.0000 | ORAL_TABLET | Freq: Every day | ORAL | Status: DC

## 2011-06-01 MED ORDER — AMLODIPINE BESYLATE 5 MG PO TABS: 5.0000 mg | ORAL_TABLET | Freq: Every day | ORAL | Status: DC

## 2011-06-18 ENCOUNTER — Ambulatory Visit: Payer: 59 | Admitting: Internal Medicine

## 2011-07-03 ENCOUNTER — Encounter: Payer: Self-pay | Admitting: Internal Medicine

## 2011-07-03 ENCOUNTER — Ambulatory Visit (INDEPENDENT_AMBULATORY_CARE_PROVIDER_SITE_OTHER): Payer: 59 | Admitting: Internal Medicine

## 2011-07-03 VITALS — BP 118/72 | HR 58 | Temp 98.1°F | Wt 206.0 lb

## 2011-07-03 DIAGNOSIS — D649 Anemia, unspecified: Secondary | ICD-10-CM

## 2011-07-03 DIAGNOSIS — J45909 Unspecified asthma, uncomplicated: Secondary | ICD-10-CM

## 2011-07-03 DIAGNOSIS — D509 Iron deficiency anemia, unspecified: Secondary | ICD-10-CM

## 2011-07-03 DIAGNOSIS — N920 Excessive and frequent menstruation with regular cycle: Secondary | ICD-10-CM

## 2011-07-03 DIAGNOSIS — I10 Essential (primary) hypertension: Secondary | ICD-10-CM

## 2011-07-03 LAB — CBC WITH DIFFERENTIAL/PLATELET
Basophils Relative: 0 % (ref 0–1)
Eosinophils Absolute: 0.1 10*3/uL (ref 0.0–0.7)
Eosinophils Relative: 2 % (ref 0–5)
MCH: 28.7 pg (ref 26.0–34.0)
MCHC: 32.9 g/dL (ref 30.0–36.0)
Neutrophils Relative %: 50 % (ref 43–77)
Platelets: 224 10*3/uL (ref 150–400)
RBC: 3.83 MIL/uL — ABNORMAL LOW (ref 3.87–5.11)
RDW: 14.1 % (ref 11.5–15.5)

## 2011-07-03 MED ORDER — LISINOPRIL-HYDROCHLOROTHIAZIDE 20-25 MG PO TABS: 1.0000 | ORAL_TABLET | Freq: Every day | ORAL | Status: DC

## 2011-07-03 MED ORDER — AMLODIPINE BESYLATE 5 MG PO TABS: 5.0000 mg | ORAL_TABLET | Freq: Every day | ORAL | Status: DC

## 2011-07-03 NOTE — Patient Instructions (Addendum)
We had checked an iron/ iron-binding capacity and CBC today. Return in 6 months for physical exam. Continue same medications.

## 2011-07-03 NOTE — Progress Notes (Signed)
  Subjective:    Patient ID: Jodi Bates, female    DOB: [birth year], 46 y.o.   MRN: [id number]  HPI  46 year old with perimenopause and hx of fibroids now with menorrhagia seen previously by Dr. Arelia Sneddon.  In today for 6 month recheck. Hx of iron deficiency anemia. History of hypertension and asthma. Has intermittent bouts of asthmatic bronchitis several times yearly. Blood pressure under good control on current regimen.    Review of Systems     Objective:   Physical Exam skin is warm and dry; nodes none. HEENT exam: TMs and pharynx are clear; neck is supple without thyromegaly or carotid bruits; chest clear to auscultation; cardiac exam: regular rate and rhythm normal S1 and S2; extremities without edema. Alert and oriented.        Assessment & Plan:  Hypertension  Allergic rhinitis  Asthma  Menorrhagia  History of iron deficiency anemia  Plan: Patient encouraged to see GYN physician regarding menorrhagia. Continue same antihypertensive medications. Continue inhalers for asthma control. Return in 6 months for physical examination.

## 2011-07-04 LAB — BASIC METABOLIC PANEL
CO2: 31 mEq/L (ref 19–32)
Calcium: 8.8 mg/dL (ref 8.4–10.5)
Chloride: 104 mEq/L (ref 96–112)
Creat: 0.75 mg/dL (ref 0.50–1.10)
Glucose, Bld: 79 mg/dL (ref 70–99)

## 2011-07-04 LAB — IRON AND TIBC: Iron: 171 ug/dL — ABNORMAL HIGH (ref 42–145)

## 2011-07-27 DIAGNOSIS — D509 Iron deficiency anemia, unspecified: Secondary | ICD-10-CM | POA: Insufficient documentation

## 2011-10-05 ENCOUNTER — Other Ambulatory Visit: Payer: Self-pay | Admitting: Internal Medicine

## 2011-10-05 DIAGNOSIS — Z1231 Encounter for screening mammogram for malignant neoplasm of breast: Secondary | ICD-10-CM

## 2011-10-09 ENCOUNTER — Ambulatory Visit
Admission: RE | Admit: 2011-10-09 | Discharge: 2011-10-09 | Disposition: A | Discharge status: Home / Self Care | Payer: 59 | Source: Ambulatory Visit | Attending: Internal Medicine | Admitting: Internal Medicine

## 2011-10-09 DIAGNOSIS — Z1231 Encounter for screening mammogram for malignant neoplasm of breast: Secondary | ICD-10-CM

## 2011-10-12 ENCOUNTER — Ambulatory Visit (INDEPENDENT_AMBULATORY_CARE_PROVIDER_SITE_OTHER): Payer: 59 | Admitting: Internal Medicine

## 2011-10-12 ENCOUNTER — Encounter: Payer: Self-pay | Admitting: Internal Medicine

## 2011-10-12 VITALS — BP 112/80 | HR 52 | Temp 97.8°F | Ht 64.0 in | Wt 214.0 lb

## 2011-10-12 DIAGNOSIS — F411 Generalized anxiety disorder: Secondary | ICD-10-CM

## 2011-10-12 DIAGNOSIS — R5383 Other fatigue: Secondary | ICD-10-CM

## 2011-10-12 DIAGNOSIS — H109 Unspecified conjunctivitis: Secondary | ICD-10-CM

## 2011-10-12 DIAGNOSIS — H8110 Benign paroxysmal vertigo, unspecified ear: Secondary | ICD-10-CM

## 2011-10-12 DIAGNOSIS — R5381 Other malaise: Secondary | ICD-10-CM

## 2011-10-12 DIAGNOSIS — F438 Other reactions to severe stress: Secondary | ICD-10-CM

## 2011-10-12 DIAGNOSIS — F439 Reaction to severe stress, unspecified: Secondary | ICD-10-CM

## 2011-10-12 DIAGNOSIS — I10 Essential (primary) hypertension: Secondary | ICD-10-CM

## 2011-10-12 DIAGNOSIS — F419 Anxiety disorder, unspecified: Secondary | ICD-10-CM

## 2011-10-12 NOTE — Patient Instructions (Addendum)
Take Xanax sparing for vertigo. Use eyedrops for conjunctivitis. Continue same antihypertensive medications. Use inhalers needed for asthma. Return in 6 months for physical exam.

## 2011-10-13 LAB — TSH: TSH: 2.359 u[IU]/mL (ref 0.350–4.500)

## 2011-10-25 NOTE — Progress Notes (Addendum)
  Subjective:    Patient ID: Jodi Bates, female    DOB: 1965-08-29, 46 y.o.   MRN: [id number]  HPI 46 year old black female has had recent episode of dizziness. Has been under a lot of stress traveling to [place] to help settle father-in-law's estate and take care of a house he left there. She and her husband are trying to get the house prepared to be rented. Dizziness has been positional in nature. It frightened her. She has a history of asthma and hypertension.    Review of Systems     Objective:   Physical Exam funduscopic exam reveals discs to be sharp and flat bilaterally. Normal vasculature. PERRLA. Extraocular movements are full. No nystagmus. Pharynx is clear. TMs are clear. Neck is supple without thyromegaly. Chest is clear. Cerebellar finger to nose testing is within normal limits. No ataxia. Cardiac exam regular rate and rhythm normal S1 and S2. Appears to have conjunctivitis left eye with injected conjunctiva.        Assessment & Plan:  Benign positional vertigo Conjunctivitis Fatigue  Anxiety related to situational stress  Plan: Prescription for Xanax 0.25 mg #90 no refill as needed for anxiety and vertigo. Reassured patient this vertigo is benign and positional in nature. TSH checked ofloxacin ophthalmic drops 2 drops in each eye 4 times a day for 5-7 days for conjunctivitis

## 2011-10-26 NOTE — Addendum Note (Signed)
Addended by: Margaree Mackintosh on: 10/26/2011 10:00 AM   Modules accepted: Level of Service

## 2011-11-24 ENCOUNTER — Telehealth: Payer: Self-pay | Admitting: Internal Medicine

## 2011-11-24 NOTE — Telephone Encounter (Signed)
Please arrange for pt to see Dr. Jenne Pane at ENT

## 2011-11-26 NOTE — Telephone Encounter (Signed)
Records sent to Dr. Jenne Pane, ENT for appointment

## 2011-12-28 ENCOUNTER — Other Ambulatory Visit: Payer: 59 | Admitting: Internal Medicine

## 2011-12-28 DIAGNOSIS — I10 Essential (primary) hypertension: Secondary | ICD-10-CM

## 2011-12-28 DIAGNOSIS — J45909 Unspecified asthma, uncomplicated: Secondary | ICD-10-CM

## 2011-12-28 LAB — CBC WITH DIFFERENTIAL/PLATELET
Basophils Relative: 0 % (ref 0–1)
HCT: 37.3 % (ref 36.0–46.0)
Hemoglobin: 12.9 g/dL (ref 12.0–15.0)
Lymphocytes Relative: 43 % (ref 12–46)
Lymphs Abs: 1.6 10*3/uL (ref 0.7–4.0)
MCHC: 34.6 g/dL (ref 30.0–36.0)
Monocytes Absolute: 0.6 10*3/uL (ref 0.1–1.0)
Monocytes Relative: 15 % — ABNORMAL HIGH (ref 3–12)
Neutro Abs: 1.4 10*3/uL — ABNORMAL LOW (ref 1.7–7.7)
Neutrophils Relative %: 40 % — ABNORMAL LOW (ref 43–77)
RBC: 4.3 MIL/uL (ref 3.87–5.11)

## 2011-12-28 LAB — LIPID PANEL
Cholesterol: 169 mg/dL (ref 0–200)
HDL: 56 mg/dL (ref >39)
LDL Cholesterol: 100 mg/dL — ABNORMAL HIGH (ref 0–99)
Triglycerides: 64 mg/dL (ref <150)

## 2011-12-28 LAB — COMPREHENSIVE METABOLIC PANEL
Albumin: 4.1 g/dL (ref 3.5–5.2)
Alkaline Phosphatase: 58 U/L (ref 39–117)
BUN: 15 mg/dL (ref 6–23)
CO2: 30 mEq/L (ref 19–32)
Calcium: 9.3 mg/dL (ref 8.4–10.5)
Chloride: 105 mEq/L (ref 96–112)
Glucose, Bld: 101 mg/dL — ABNORMAL HIGH (ref 70–99)
Potassium: 3.9 mEq/L (ref 3.5–5.3)
Sodium: 143 mEq/L (ref 135–145)
Total Protein: 6.5 g/dL (ref 6.0–8.3)

## 2011-12-29 ENCOUNTER — Ambulatory Visit (INDEPENDENT_AMBULATORY_CARE_PROVIDER_SITE_OTHER): Payer: 59 | Admitting: Internal Medicine

## 2011-12-29 ENCOUNTER — Encounter: Payer: Self-pay | Admitting: Internal Medicine

## 2011-12-29 VITALS — BP 118/78 | HR 60 | Temp 98.3°F | Ht 64.5 in | Wt 213.0 lb

## 2011-12-29 DIAGNOSIS — M791 Myalgia, unspecified site: Secondary | ICD-10-CM

## 2011-12-29 DIAGNOSIS — Z8709 Personal history of other diseases of the respiratory system: Secondary | ICD-10-CM

## 2011-12-29 DIAGNOSIS — M2550 Pain in unspecified joint: Secondary | ICD-10-CM

## 2011-12-29 DIAGNOSIS — I10 Essential (primary) hypertension: Secondary | ICD-10-CM

## 2011-12-29 DIAGNOSIS — N912 Amenorrhea, unspecified: Secondary | ICD-10-CM

## 2011-12-29 DIAGNOSIS — R635 Abnormal weight gain: Secondary | ICD-10-CM

## 2011-12-29 DIAGNOSIS — R7302 Impaired glucose tolerance (oral): Secondary | ICD-10-CM

## 2011-12-29 DIAGNOSIS — R5381 Other malaise: Secondary | ICD-10-CM

## 2011-12-29 DIAGNOSIS — D509 Iron deficiency anemia, unspecified: Secondary | ICD-10-CM

## 2011-12-29 DIAGNOSIS — R42 Dizziness and giddiness: Secondary | ICD-10-CM

## 2011-12-29 DIAGNOSIS — Z0000 Encounter for general adult medical examination without abnormal findings: Secondary | ICD-10-CM

## 2011-12-29 DIAGNOSIS — R5383 Other fatigue: Secondary | ICD-10-CM

## 2011-12-30 LAB — HEMOGLOBIN A1C
Hgb A1c MFr Bld: 5.9 % — ABNORMAL HIGH (ref <5.7)
Mean Plasma Glucose: 123 mg/dL — ABNORMAL HIGH (ref <117)

## 2011-12-30 LAB — ANA: Anti Nuclear Antibody(ANA): NEGATIVE

## 2011-12-30 LAB — CYCLIC CITRUL PEPTIDE ANTIBODY, IGG: Cyclic Citrullin Peptide Ab: <2 U/mL (ref 0.0–5.0)

## 2011-12-30 LAB — IRON AND TIBC
Iron: 98 ug/dL (ref 42–145)
UIBC: 271 ug/dL (ref 125–400)

## 2011-12-30 LAB — SEDIMENTATION RATE: Sed Rate: 5 mm/hr (ref 0–22)

## 2012-01-04 NOTE — Progress Notes (Signed)
Patient notified

## 2012-01-08 ENCOUNTER — Other Ambulatory Visit (HOSPITAL_COMMUNITY): Payer: Self-pay | Admitting: Diagnostic Neuroimaging

## 2012-01-08 DIAGNOSIS — R42 Dizziness and giddiness: Secondary | ICD-10-CM

## 2012-01-12 ENCOUNTER — Ambulatory Visit (HOSPITAL_COMMUNITY): Admission: RE | Admit: 2012-01-12 | Payer: 59 | Source: Ambulatory Visit

## 2012-01-12 ENCOUNTER — Other Ambulatory Visit (HOSPITAL_COMMUNITY): Payer: 59

## 2012-01-13 ENCOUNTER — Ambulatory Visit (HOSPITAL_COMMUNITY)
Admission: RE | Admit: 2012-01-13 | Discharge: 2012-01-13 | Disposition: A | Discharge status: Home / Self Care | Payer: 59 | Source: Ambulatory Visit | Attending: Diagnostic Neuroimaging | Admitting: Diagnostic Neuroimaging

## 2012-01-13 DIAGNOSIS — R42 Dizziness and giddiness: Secondary | ICD-10-CM | POA: Insufficient documentation

## 2012-01-13 MED ORDER — GADOBENATE DIMEGLUMINE 529 MG/ML IV SOLN
20.0000 mL | Freq: Once | INTRAVENOUS | Status: AC
  Administered 2012-01-13: 20 mL via INTRAVENOUS

## 2012-01-25 ENCOUNTER — Ambulatory Visit (INDEPENDENT_AMBULATORY_CARE_PROVIDER_SITE_OTHER): Payer: 59 | Admitting: Internal Medicine

## 2012-01-25 ENCOUNTER — Encounter: Payer: Self-pay | Admitting: Internal Medicine

## 2012-01-25 VITALS — BP 106/84 | HR 88 | Temp 100.1°F | Wt 213.0 lb

## 2012-01-25 DIAGNOSIS — I10 Essential (primary) hypertension: Secondary | ICD-10-CM

## 2012-01-25 DIAGNOSIS — Z8709 Personal history of other diseases of the respiratory system: Secondary | ICD-10-CM

## 2012-01-25 DIAGNOSIS — L0591 Pilonidal cyst without abscess: Secondary | ICD-10-CM

## 2012-01-25 NOTE — Progress Notes (Signed)
  Subjective:    Patient ID: Jodi Bates, female    DOB: 02-10-1965, 46 y.o.   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 She is in today with draining lesion perirectal area. Noticed a sore bump upper left buttock a week or so ago. She applied some over-the-counter boil medication. Just began to drain yesterday.   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 Vicodin 5/500 one by mouth Q8 hours when necessary pain. Appointment see general surgeon tomorrow.

## 2012-01-25 NOTE — Patient Instructions (Addendum)
Take doxycycline 100 mg twice daily for 10 days. Take pain medication sparingly. Sitz baths are indicated. 20 minutes in warm soapy water twice daily. Cleaned with peroxide. See general surgeon tomorrow.

## 2012-01-26 ENCOUNTER — Ambulatory Visit (INDEPENDENT_AMBULATORY_CARE_PROVIDER_SITE_OTHER): Payer: Commercial Managed Care - PPO | Admitting: General Surgery

## 2012-01-26 ENCOUNTER — Encounter (INDEPENDENT_AMBULATORY_CARE_PROVIDER_SITE_OTHER): Payer: Self-pay | Admitting: General Surgery

## 2012-01-26 VITALS — BP 122/70 | HR 84 | Temp 97.8°F | Resp 16 | Ht 64.5 in | Wt 209.0 lb

## 2012-01-26 DIAGNOSIS — L0591 Pilonidal cyst without abscess: Secondary | ICD-10-CM

## 2012-01-26 LAB — CBC WITH DIFFERENTIAL/PLATELET
Hemoglobin: 13.2 g/dL (ref 12.0–15.0)
Lymphocytes Relative: 22 % (ref 12–46)
Lymphs Abs: 1.6 10*3/uL (ref 0.7–4.0)
MCV: 89.2 fL (ref 78.0–100.0)
Monocytes Relative: 10 % (ref 3–12)
Neutrophils Relative %: 67 % (ref 43–77)
Platelets: 240 10*3/uL (ref 150–400)
RBC: 4.43 MIL/uL (ref 3.87–5.11)
WBC: 7.4 10*3/uL (ref 4.0–10.5)

## 2012-01-26 NOTE — Progress Notes (Signed)
The patient comes in with a partially draining pilonidal cyst in the left superior gluteal fold.  With a Betadine prep then under local anesthesia an 11 blade was used to incise and drain the pilonidal cyst. Approximately 5 cc of mucopurulent fluid was drained. No cultures were sent. We subsequently packed with approximately 2-1/2 feet of quarter-inch iodoform Nu Gauze. A sterile dressing was applied.  The patient and her husband were instructed to change outer dressing daily. He is to remove the packing on Thursday and then start to just clean it with soap and water and cover it. A we see her back in clinic in a couple weeks. She should continue the doxycycline antibiotic prescribed by her primary care physician.

## 2012-01-26 NOTE — Progress Notes (Signed)
Thanks Vonna Kotyk for seeing her.

## 2012-01-28 ENCOUNTER — Telehealth (INDEPENDENT_AMBULATORY_CARE_PROVIDER_SITE_OTHER): Payer: Self-pay

## 2012-01-28 ENCOUNTER — Telehealth: Payer: Self-pay | Admitting: Internal Medicine

## 2012-01-28 NOTE — Telephone Encounter (Signed)
Dr. Lindie Spruce drained infected pilonidal cyst 01/26/2012. She needs FMLA form filled out.

## 2012-01-28 NOTE — Telephone Encounter (Signed)
The pt called in to inquire about how to clean the incision.  She is removing the packing today.  I read Dr Dixon Boos note to look for specific instructions and he just said to wash with soap and water and keep it covered.
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
## 2012-01-30 ENCOUNTER — Encounter: Payer: Self-pay | Admitting: Internal Medicine

## 2012-01-30 NOTE — Patient Instructions (Addendum)
Lab studies have been drawn and followup of anemia, iron deficiency, fatigue. FSH is been checked. Suggest returning to GYN to discuss management of amenorrhea/menopause. Continue antihypertensive medication and return in 6 months.

## 2012-02-09 ENCOUNTER — Telehealth (INDEPENDENT_AMBULATORY_CARE_PROVIDER_SITE_OTHER): Payer: Self-pay | Admitting: General Surgery

## 2012-02-09 NOTE — Telephone Encounter (Signed)
Pt called to inquire is OK to soak in epsom salts for her pilonidal cyst.  It is draining minimally now.  Given OK to do this.  She will continue to pat dry and cover.  Will call back with any concerns.

## 2012-02-23 ENCOUNTER — Encounter (INDEPENDENT_AMBULATORY_CARE_PROVIDER_SITE_OTHER): Payer: Self-pay | Admitting: General Surgery

## 2012-02-23 ENCOUNTER — Encounter (INDEPENDENT_AMBULATORY_CARE_PROVIDER_SITE_OTHER): Payer: Self-pay

## 2012-02-23 ENCOUNTER — Ambulatory Visit (INDEPENDENT_AMBULATORY_CARE_PROVIDER_SITE_OTHER): Payer: Commercial Managed Care - PPO | Admitting: General Surgery

## 2012-02-23 VITALS — BP 142/84 | HR 64 | Temp 97.9°F | Resp 16 | Ht 60.5 in | Wt 210.2 lb

## 2012-02-23 DIAGNOSIS — L988 Other specified disorders of the skin and subcutaneous tissue: Secondary | ICD-10-CM | POA: Insufficient documentation

## 2012-02-23 DIAGNOSIS — L0501 Pilonidal cyst with abscess: Secondary | ICD-10-CM

## 2012-02-23 MED ORDER — AMOXICILLIN-POT CLAVULANATE 875-125 MG PO TABS: 1.0000 | ORAL_TABLET | Freq: Two times a day (BID) | ORAL | Status: AC

## 2012-02-23 NOTE — Progress Notes (Signed)
The patient comes in today with the recurrence of the abscess on the left side and the development of a new pilonidal cyst on the right side. After prepping the patient with Betadine and injecting her with Xylocaine I was able to open up both areas with a #11 blade and packed with quarter-inch iodoform Nu Gauze.  The packing is to be removed in 2 days. At that time she should start sitz baths. She should return to see me in approximately 2 weeks. At that time we will decide when and if she should go ahead and get reexcision of her pilonidal cyst area. She may require an open procedure with packing subsequently.  We'll start the patient on Augmentin today for a wound

## 2012-03-08 ENCOUNTER — Encounter (INDEPENDENT_AMBULATORY_CARE_PROVIDER_SITE_OTHER): Payer: Self-pay | Admitting: General Surgery

## 2012-03-08 ENCOUNTER — Ambulatory Visit (INDEPENDENT_AMBULATORY_CARE_PROVIDER_SITE_OTHER): Payer: Commercial Managed Care - PPO | Admitting: General Surgery

## 2012-03-08 VITALS — BP 138/86 | HR 60 | Temp 97.3°F | Resp 16 | Ht 64.5 in | Wt 212.6 lb

## 2012-03-08 DIAGNOSIS — L732 Hidradenitis suppurativa: Secondary | ICD-10-CM

## 2012-03-08 MED ORDER — AMOXICILLIN-POT CLAVULANATE 875-125 MG PO TABS: 1.0000 | ORAL_TABLET | Freq: Two times a day (BID) | ORAL | Status: AC

## 2012-03-08 NOTE — Progress Notes (Signed)
The patient comes in today and even told the wounds have healed somewhat she appears to still have some chronic inflammation and infection. She continues to have some antibiotics. I will re\re prescribe those antibiotics state.  The infected area in her superior gluteal fold appears to be more hidradenitis versus pilonidal disease. There does not appear to be much extension down into the gluteal cleft and I did not see the small openings into the pilonidal that she would normally see. Therefore I believe excision of hidradenitis in this area is more appropriate and that should be done under anesthesia.  I talked to the patient and her husband about the possibility of undergoing surgery and examination under anesthesia. Excision might require leaving the wound open if it appears to be chronically infected. They understand the risks and benefits and wish to proceed as soon as possible.

## 2012-03-08 NOTE — Addendum Note (Signed)
Addended by: Frederik Schmidt on: 03/08/2012 04:53 PM   Modules accepted: Orders

## 2012-03-26 DIAGNOSIS — S31809A Unspecified open wound of unspecified buttock, initial encounter: Secondary | ICD-10-CM

## 2012-03-26 HISTORY — DX: Unspecified open wound of unspecified buttock, initial encounter: S31.809A

## 2012-03-30 ENCOUNTER — Other Ambulatory Visit: Payer: Self-pay

## 2012-03-30 MED ORDER — LISINOPRIL-HYDROCHLOROTHIAZIDE 20-25 MG PO TABS: 1.0000 | ORAL_TABLET | Freq: Every day | ORAL | Status: DC

## 2012-03-30 MED ORDER — AMLODIPINE BESYLATE 5 MG PO TABS: 5.0000 mg | ORAL_TABLET | Freq: Every day | ORAL | Status: DC

## 2012-04-06 ENCOUNTER — Telehealth (INDEPENDENT_AMBULATORY_CARE_PROVIDER_SITE_OTHER): Payer: Self-pay

## 2012-04-06 NOTE — Telephone Encounter (Signed)
I returned patients call regarding her question about surgery. If she wants to have Wyatt evaluate her pilonidal / hidradenitis to see if surgery is still needed she could make that appointment. She needs to know although symptoms have resolved for now, often with pilonidal / hidradenitis disease symptoms flair back up as some point. It will ultimately be the decision of the patient if she wants to wait and watch it or go ahead and excise the area while it is not infected and inflamed. If it becomes infected and inflamed again she will need to go through the entire process of abx and waiting for it to heal and then rescheduling surgery then. She said she would rather go ahead and keep the surgery scheduled instead of risking the area flairing back up.

## 2012-04-09 ENCOUNTER — Encounter: Payer: Self-pay | Admitting: Internal Medicine

## 2012-04-09 DIAGNOSIS — R7302 Impaired glucose tolerance (oral): Secondary | ICD-10-CM | POA: Insufficient documentation

## 2012-04-25 ENCOUNTER — Encounter (HOSPITAL_BASED_OUTPATIENT_CLINIC_OR_DEPARTMENT_OTHER): Payer: Self-pay | Admitting: *Deleted

## 2012-04-25 NOTE — Pre-Procedure Instructions (Signed)
To come for CBC, diff, BMET, EKG 

## 2012-04-27 ENCOUNTER — Encounter (HOSPITAL_BASED_OUTPATIENT_CLINIC_OR_DEPARTMENT_OTHER)
Admission: RE | Admit: 2012-04-27 | Discharge: 2012-04-27 | Disposition: A | Discharge status: Home / Self Care | Payer: 59 | Source: Ambulatory Visit | Attending: General Surgery | Admitting: General Surgery

## 2012-04-27 LAB — CBC WITH DIFFERENTIAL/PLATELET
Basophils Relative: 1 % (ref 0–1)
Eosinophils Absolute: 0.1 10*3/uL (ref 0.0–0.7)
Hemoglobin: 12.8 g/dL (ref 12.0–15.0)
Lymphs Abs: 1.7 10*3/uL (ref 0.7–4.0)
MCH: 29.6 pg (ref 26.0–34.0)
Monocytes Relative: 11 % (ref 3–12)
Neutro Abs: 1.2 10*3/uL — ABNORMAL LOW (ref 1.7–7.7)
Neutrophils Relative %: 35 % — ABNORMAL LOW (ref 43–77)
Platelets: 216 10*3/uL (ref 150–400)
RBC: 4.32 MIL/uL (ref 3.87–5.11)

## 2012-04-27 LAB — BASIC METABOLIC PANEL
Calcium: 9.3 mg/dL (ref 8.4–10.5)
GFR calc Af Amer: >90 mL/min (ref >90)
GFR calc non Af Amer: 81 mL/min — ABNORMAL LOW (ref >90)
Glucose, Bld: 112 mg/dL — ABNORMAL HIGH (ref 70–99)
Potassium: 3.6 mEq/L (ref 3.5–5.1)
Sodium: 139 mEq/L (ref 135–145)

## 2012-04-27 NOTE — H&P (Signed)
Jodi Bates is an 47 y.o. female.   Chief Complaint: Recurrent gluteal hidradenitis or pilonidal disease HPI: The patient comes in today and even though the wounds have healed somewhat she appears to still have some chronic inflammation and infection. She continues to have some antibiotics. I will re\re prescribe those antibiotics state.  The infected area in her superior gluteal fold appears to be more hidradenitis versus pilonidal disease. There does not appear to be much extension down into the gluteal cleft and I did not see the small openings into the pilonidal that she would normally see. Therefore I believe excision of hidradenitis in this area is more appropriate and that should be done under anesthesia.  I talked to the patient and her husband about the possibility of undergoing surgery and examination under anesthesia. Excision might require leaving the wound open if it appears to be chronically infected. They understand the risks and benefits and wish to proceed as soon as possible.  I spoke with the patient again on 04/27/2012 and she has had a recurrence treated with oral antibiotics.  We will go ahead with the procedure for pilonidal disease    Past Medical History  Diagnosis Date  . Headache     tension  . Iron deficiency anemia     no current problems or meds.  . 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 Hypertension     under control with meds., has been on med. > 5 yr.  . Asthma     prn inhalers    Past Surgical History  Procedure Laterality Date  . Tubal ligation    . Breast surgery       lt breast/ benign cyst  . Laparoscopic endometriosis fulguration    . Hysteroscopy w/d&c  06/25/2004  . Cyst excision      back of head  . Polypectomy      uterine    Family History  Problem Relation Age of Onset  . Diabetes Mother   . Hypertension Mother   . Cancer Father     deceased metastaic throat cancer  . Diabetes Brother   . Cancer  Maternal Grandmother     breast   Social History:  reports that she has never smoked. She has never used smokeless tobacco. She reports that she does not drink alcohol or use illicit drugs.  Allergies:  Allergies  Allergen Reactions  . Adhesive (Tape) Itching and Other (See Comments)    BLISTERS  . Latex Itching, Other (See Comments) and Cough    SKIN IRRITATION  . Soap Other (See Comments)    TRIGGERS ASTHMA ATTACK - FRAGRANT SOAPS AND LOTIONS    No prescriptions prior to admission    No results found for this or any previous visit (from the past 48 hour(s)). No results found.  Review of Systems  All other systems reviewed and are negative.    Height 5\' 4"  (1.626 m), weight 210 lb (95.255 kg), last menstrual period 04/25/2012. Physical Exam  Constitutional: She is oriented to person, place, and time. She appears well-developed and well-nourished.  HENT:  Head: Normocephalic and atraumatic.  Eyes: Conjunctivae and EOM are normal. Pupils are equal, round, and reactive to light.  Neck: Normal range of motion. Neck supple.  Cardiovascular: Normal rate and regular rhythm.   Respiratory: Effort normal.  GI: Soft. Bowel sounds are normal.  Genitourinary:     Musculoskeletal: Normal range of motion.  Neurological: She is alert  and oriented to person, place, and time. She has normal reflexes.  Skin: Skin is warm and dry.     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  No discreet punctum noted.  Will excise the area and if possible will try to close.otherwise will leave open for dressing changes.  Cherylynn Ridges 04/27/2012, 2:41 PM

## 2012-04-28 ENCOUNTER — Encounter (HOSPITAL_BASED_OUTPATIENT_CLINIC_OR_DEPARTMENT_OTHER)
Admission: RE | Disposition: A | Discharge status: Home / Self Care | Payer: Self-pay | Source: Ambulatory Visit | Attending: General Surgery

## 2012-04-28 ENCOUNTER — Ambulatory Visit (HOSPITAL_BASED_OUTPATIENT_CLINIC_OR_DEPARTMENT_OTHER): Payer: 59 | Admitting: *Deleted

## 2012-04-28 ENCOUNTER — Encounter (HOSPITAL_BASED_OUTPATIENT_CLINIC_OR_DEPARTMENT_OTHER): Payer: Self-pay | Admitting: *Deleted

## 2012-04-28 ENCOUNTER — Ambulatory Visit (HOSPITAL_BASED_OUTPATIENT_CLINIC_OR_DEPARTMENT_OTHER)
Admission: RE | Admit: 2012-04-28 | Discharge: 2012-04-28 | Disposition: A | Discharge status: Home / Self Care | Payer: 59 | Source: Ambulatory Visit | Attending: General Surgery | Admitting: General Surgery

## 2012-04-28 DIAGNOSIS — I10 Essential (primary) hypertension: Secondary | ICD-10-CM | POA: Insufficient documentation

## 2012-04-28 DIAGNOSIS — J45909 Unspecified asthma, uncomplicated: Secondary | ICD-10-CM | POA: Insufficient documentation

## 2012-04-28 DIAGNOSIS — L0591 Pilonidal cyst without abscess: Secondary | ICD-10-CM

## 2012-04-28 DIAGNOSIS — L988 Other specified disorders of the skin and subcutaneous tissue: Secondary | ICD-10-CM

## 2012-04-28 DIAGNOSIS — Z9109 Other allergy status, other than to drugs and biological substances: Secondary | ICD-10-CM | POA: Insufficient documentation

## 2012-04-28 DIAGNOSIS — Z91040 Latex allergy status: Secondary | ICD-10-CM | POA: Insufficient documentation

## 2012-04-28 HISTORY — DX: Headache: R51

## 2012-04-28 HISTORY — PX: PILONIDAL CYST EXCISION: SHX744

## 2012-04-28 HISTORY — DX: Unspecified open wound of unspecified buttock, initial encounter: S31.809A

## 2012-04-28 SURGERY — EXAM UNDER ANESTHESIA, RECTUM
Anesthesia: General | Site: Buttocks | Wound class: Clean

## 2012-04-28 MED ORDER — FENTANYL CITRATE 0.05 MG/ML IJ SOLN: 50.0000 ug | INTRAMUSCULAR | Status: DC | PRN

## 2012-04-28 MED ORDER — BUPIVACAINE-EPINEPHRINE 0.25% -1:200000 IJ SOLN
INTRAMUSCULAR | Status: DC | PRN
  Administered 2012-04-28: 20 mL

## 2012-04-28 MED ORDER — MIDAZOLAM HCL 5 MG/5ML IJ SOLN
INTRAMUSCULAR | Status: DC | PRN
  Administered 2012-04-28: 2 mg via INTRAVENOUS

## 2012-04-28 MED ORDER — ONDANSETRON HCL 4 MG/2ML IJ SOLN
INTRAMUSCULAR | Status: DC | PRN
  Administered 2012-04-28: 4 mg via INTRAVENOUS

## 2012-04-28 MED ORDER — SUFENTANIL CITRATE 50 MCG/ML IV SOLN
INTRAVENOUS | Status: DC | PRN
  Administered 2012-04-28: 20 ug via INTRAVENOUS

## 2012-04-28 MED ORDER — MIDAZOLAM HCL 2 MG/2ML IJ SOLN: 1.0000 mg | INTRAMUSCULAR | Status: DC | PRN

## 2012-04-28 MED ORDER — SUCCINYLCHOLINE CHLORIDE 20 MG/ML IJ SOLN
INTRAMUSCULAR | Status: DC | PRN
  Administered 2012-04-28: 100 mg via INTRAVENOUS

## 2012-04-28 MED ORDER — MIDAZOLAM HCL 2 MG/ML PO SYRP: 12.0000 mg | ORAL_SOLUTION | Freq: Once | ORAL | Status: DC | PRN

## 2012-04-28 MED ORDER — PROPOFOL 10 MG/ML IV BOLUS
INTRAVENOUS | Status: DC | PRN
  Administered 2012-04-28: 200 mg via INTRAVENOUS

## 2012-04-28 MED ORDER — OXYCODONE HCL 5 MG PO TABS: 5.0000 mg | ORAL_TABLET | Freq: Once | ORAL | Status: DC | PRN

## 2012-04-28 MED ORDER — DEXAMETHASONE SODIUM PHOSPHATE 4 MG/ML IJ SOLN
INTRAMUSCULAR | Status: DC | PRN
  Administered 2012-04-28: 10 mg via INTRAVENOUS

## 2012-04-28 MED ORDER — LACTATED RINGERS IV SOLN
INTRAVENOUS | Status: DC
  Administered 2012-04-28 (×2): via INTRAVENOUS

## 2012-04-28 MED ORDER — DEXTROSE 5 % IV SOLN
2.0000 g | INTRAVENOUS | Status: AC
  Administered 2012-04-28: 2 g via INTRAVENOUS

## 2012-04-28 MED ORDER — OXYCODONE HCL 5 MG/5ML PO SOLN: 5.0000 mg | Freq: Once | ORAL | Status: DC | PRN

## 2012-04-28 MED ORDER — CHLORHEXIDINE GLUCONATE 4 % EX LIQD: 1.0000 "application " | Freq: Once | CUTANEOUS | Status: DC

## 2012-04-28 MED ORDER — HYDROMORPHONE HCL PF 1 MG/ML IJ SOLN
0.2500 mg | INTRAMUSCULAR | Status: DC | PRN
  Administered 2012-04-28 (×2): 0.5 mg via INTRAVENOUS

## 2012-04-28 MED ORDER — OXYCODONE-ACETAMINOPHEN 10-650 MG PO TABS: 1.0000 | ORAL_TABLET | Freq: Four times a day (QID) | ORAL | Status: DC | PRN

## 2012-04-28 MED ORDER — POVIDONE-IODINE 10 % EX OINT
TOPICAL_OINTMENT | CUTANEOUS | Status: DC | PRN
  Administered 2012-04-28: 1 via TOPICAL

## 2012-04-28 SURGICAL SUPPLY — 49 items
APL SKNCLS STERI-STRIP NONHPOA (GAUZE/BANDAGES/DRESSINGS) ×1
BANDAGE GAUZE ELAST BULKY 4 IN (GAUZE/BANDAGES/DRESSINGS) ×2 IMPLANT
BENZOIN TINCTURE PRP APPL 2/3 (GAUZE/BANDAGES/DRESSINGS) ×2 IMPLANT
BLADE SURG 10 STRL SS (BLADE) ×2 IMPLANT
BLADE SURG 15 STRL LF DISP TIS (BLADE) ×1 IMPLANT
BLADE SURG 15 STRL SS (BLADE) ×2
BNDG ADH 5X4 AIR PERM ELC (GAUZE/BANDAGES/DRESSINGS)
BNDG COHESIVE 4X5 WHT NS (GAUZE/BANDAGES/DRESSINGS) IMPLANT
BRIEF STRETCH FOR OB PAD LRG (UNDERPADS AND DIAPERS) ×1 IMPLANT
CANISTER SUCTION 1200CC (MISCELLANEOUS) IMPLANT
CHLORAPREP W/TINT 26ML (MISCELLANEOUS) ×2 IMPLANT
CLEANER CAUTERY TIP 5X5 PAD (MISCELLANEOUS) ×1 IMPLANT
CLOTH BEACON ORANGE TIMEOUT ST (SAFETY) ×2 IMPLANT
COVER MAYO STAND STRL (DRAPES) ×2 IMPLANT
COVER TABLE BACK 60X90 (DRAPES) ×2 IMPLANT
DECANTER SPIKE VIAL GLASS SM (MISCELLANEOUS) IMPLANT
DRAPE LAPAROTOMY T 102X78X121 (DRAPES) ×2 IMPLANT
DRAPE UTILITY XL STRL (DRAPES) ×3 IMPLANT
DRSG PAD ABDOMINAL 8X10 ST (GAUZE/BANDAGES/DRESSINGS) ×1 IMPLANT
ELECT REM PT RETURN 9FT ADLT (ELECTROSURGICAL) ×2
ELECTRODE REM PT RTRN 9FT ADLT (ELECTROSURGICAL) ×1 IMPLANT
GAUZE SPONGE 4X4 12PLY STRL LF (GAUZE/BANDAGES/DRESSINGS) ×4 IMPLANT
GAUZE SPONGE 4X4 16PLY XRAY LF (GAUZE/BANDAGES/DRESSINGS) ×2 IMPLANT
GLOVE BIOGEL PI IND STRL 8 (GLOVE) ×1 IMPLANT
GLOVE BIOGEL PI INDICATOR 8 (GLOVE) ×1
GLOVE ECLIPSE 7.5 STRL STRAW (GLOVE) ×2 IMPLANT
GLOVE SKINSENSE NS SZ7.0 (GLOVE) ×2
GLOVE SKINSENSE STRL SZ7.0 (GLOVE) IMPLANT
GLOVE SURG SS PI 7.5 STRL IVOR (GLOVE) ×1 IMPLANT
GOWN PREVENTION PLUS XLARGE (GOWN DISPOSABLE) ×2 IMPLANT
GOWN PREVENTION PLUS XXLARGE (GOWN DISPOSABLE) ×1 IMPLANT
NDL HYPO 25X1 1.5 SAFETY (NEEDLE) ×1 IMPLANT
NEEDLE HYPO 25X1 1.5 SAFETY (NEEDLE) ×2 IMPLANT
NS IRRIG 1000ML POUR BTL (IV SOLUTION) ×2 IMPLANT
PACK BASIN DAY SURGERY FS (CUSTOM PROCEDURE TRAY) ×2 IMPLANT
PAD CLEANER CAUTERY TIP 5X5 (MISCELLANEOUS) ×1
PENCIL BUTTON HOLSTER BLD 10FT (ELECTRODE) ×2 IMPLANT
SPONGE LAP 4X18 X RAY DECT (DISPOSABLE) IMPLANT
SUT ETHILON 2 0 FSLX (SUTURE) ×3 IMPLANT
SUT VIC AB 2-0 CT1 27 (SUTURE) ×6
SUT VIC AB 2-0 CT1 TAPERPNT 27 (SUTURE) IMPLANT
SUT VIC AB 3-0 SH 27 (SUTURE) ×8
SUT VIC AB 3-0 SH 27X BRD (SUTURE) IMPLANT
SYR CONTROL 10ML LL (SYRINGE) ×2 IMPLANT
TAPE CLOTH 3X10 TAN LF (GAUZE/BANDAGES/DRESSINGS) ×2 IMPLANT
TOWEL OR 17X24 6PK STRL BLUE (TOWEL DISPOSABLE) ×4 IMPLANT
TOWEL OR NON WOVEN STRL DISP B (DISPOSABLE) ×2 IMPLANT
TUBE CONNECTING 20X1/4 (TUBING) IMPLANT
YANKAUER SUCT BULB TIP NO VENT (SUCTIONS) IMPLANT

## 2012-04-28 NOTE — Transfer of Care (Signed)
Immediate Anesthesia Transfer of Care Note  Patient: Jodi Bates  Procedure(s) Performed: Procedure(s) with comments: RECTAL EXAM UNDER ANESTHESIA (N/A) - exam under anesthesia and exicision of pilonidal cyst exam under anesthesia and exicision of pilonidal (N/A)  Patient Location: PACU  Anesthesia Type:General  Level of Consciousness: awake and alert   Airway & Oxygen Therapy: Patient Spontanous Breathing and Patient connected to face mask oxygen  Post-op Assessment: Report given to PACU RN and Post -op Vital signs reviewed and stable  Post vital signs: Reviewed and stable  Complications: No apparent anesthesia complications

## 2012-04-28 NOTE — Interval H&P Note (Signed)
History and Physical Interval Note:  04/28/2012 10:27 AM  Lorn Junes  has presented today for surgery, with the diagnosis of Recurrent gluteal cleft infections  The various methods of treatment have been discussed with the patient and family. After consideration of risks, benefits and other options for treatment, the patient has consented to  Procedure(s) with comments: RECTAL EXAM UNDER ANESTHESIA (N/A) - exam under anesthesia and exicision of pilonidal or hidradenitis exam under anesthesia and exicision of pilonidal or hidradenitis (N/A) as a surgical intervention .  The patient's history has been reviewed, patient examined, no change in status, stable for surgery.  I have reviewed the patient's chart and labs.  Questions were answered to the patient's satisfaction.    Patient currently has no drainage.  May need to leave the wound open.  Will be able to tell at the time of surgery.   Prinston Kynard, Marta Lamas

## 2012-04-28 NOTE — Op Note (Signed)
OPERATIVE REPORT  DATE OF OPERATION: 04/28/2012  PATIENT:  Jodi Bates  47 y.o. female  PRE-OPERATIVE DIAGNOSIS:  Recurrent gluteal cleft infections  POST-OPERATIVE DIAGNOSIS:  Pilonidal cyst  PROCEDURE:  Procedure(s): RECTAL EXAM UNDER ANESTHESIA exam under anesthesia and exicision of pilonidal disease with primary closure  SURGEON:  Surgeon(s): Cherylynn Ridges, MD  ASSISTANT: None  ANESTHESIA:   general  EBL: <30 ml  BLOOD ADMINISTERED: none  DRAINS: none   SPECIMEN:  Source of Specimen:  excised pilonidal tissue  COUNTS CORRECT:  YES  PROCEDURE DETAILS: The patient was taken to the operating room and placed on the table initially in the supine position. After an adequate general endotracheal anesthetic was administered she was placed in the prone position exposing her superior and perianal gluteal area for the procedure.  After proper time out was performed identifying the patient and procedure be performed we marked the area of excision using a marking pen. We then used a lacrimal probe in order to determine if this were truly pilonidal disease versus hidradenitis. There was a small punctum a single hole which allowed the passage of the lacrimal probe into the area of the recurrent infections. We excised the area using a #10 blade down to subcutaneous tissue, the use electrocautery nor to incise down to the fascia. We obtain adequate hemostasis using electrocautery.  We washed out the area with saline. There was no evidence of any purulent drainage. Upon manipulating the excised tissue after was excised the probe easily passed through the punctum into the infected area and out of the previously incised and drained infected areas.  We closed the wound in multiple layers deep with 2-0 Vicryl subcutaneous layers were placed interrupted sutures. We then placed the medium range 3-0 Vicryl sutures interrupted in the subcutaneous tissue the skin was closed using interrupted simple  and vertical mattress sutures nor to close the skin of 2-0 nylon. We injected cortisone Marcaine with epinephrine into the surrounding tissue. A total of 20 mL were used. All counts were correct. There was some tension in the midportion of the wound but the edges came together nicely. A sterile dressing was applied. All needle counts, sponge counts, and Instrument counts were correct.  PATIENT DISPOSITION:  PACU - hemodynamically stable.   Cherylynn Ridges 4/3/201412:07 PM

## 2012-04-28 NOTE — Anesthesia Postprocedure Evaluation (Signed)
  Anesthesia Post-op Note  Patient: Jodi Bates  Procedure(s) Performed: Procedure(s) with comments: RECTAL EXAM UNDER ANESTHESIA (N/A) - exam under anesthesia and exicision of pilonidal cyst exam under anesthesia and exicision of pilonidal (N/A)  Patient Location: PACU  Anesthesia Type:General  Level of Consciousness: awake and alert   Airway and Oxygen Therapy: Patient Spontanous Breathing and Patient connected to face mask oxygen  Post-op Pain: mild  Post-op Assessment: Post-op Vital signs reviewed, Patient's Cardiovascular Status Stable, Respiratory Function Stable, Patent Airway and No signs of Nausea or vomiting  Post-op Vital Signs: Reviewed and stable  Complications: No apparent anesthesia complications

## 2012-04-28 NOTE — Anesthesia Procedure Notes (Signed)
Procedure Name: Intubation Date/Time: 04/28/2012 10:49 AM Performed by: Zenia Resides D Pre-anesthesia Checklist: Patient identified, Emergency Drugs available, Suction available and Patient being monitored Patient Re-evaluated:Patient Re-evaluated prior to inductionOxygen Delivery Method: Circle System Utilized Preoxygenation: Pre-oxygenation with 100% oxygen Intubation Type: IV induction Ventilation: Mask ventilation without difficulty Laryngoscope Size: Mac and 3 Grade View: Grade I Tube type: Oral Number of attempts: 1 Airway Equipment and Method: stylet and oral airway Placement Confirmation: ETT inserted through vocal cords under direct vision,  positive ETCO2 and breath sounds checked- equal and bilateral Secured at: 22 cm Tube secured with: Tape Dental Injury: Teeth and Oropharynx as per pre-operative assessment
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
## 2012-04-29 ENCOUNTER — Encounter (HOSPITAL_BASED_OUTPATIENT_CLINIC_OR_DEPARTMENT_OTHER): Payer: Self-pay | Admitting: General Surgery

## 2012-05-10 ENCOUNTER — Ambulatory Visit (INDEPENDENT_AMBULATORY_CARE_PROVIDER_SITE_OTHER): Payer: Commercial Managed Care - PPO | Admitting: General Surgery

## 2012-05-10 ENCOUNTER — Encounter (INDEPENDENT_AMBULATORY_CARE_PROVIDER_SITE_OTHER): Payer: Self-pay | Admitting: General Surgery

## 2012-05-10 VITALS — BP 136/83 | HR 70 | Temp 97.2°F | Resp 14 | Ht 64.5 in | Wt 213.0 lb

## 2012-05-10 DIAGNOSIS — Z09 Encounter for follow-up examination after completed treatment for conditions other than malignant neoplasm: Secondary | ICD-10-CM | POA: Insufficient documentation

## 2012-05-10 NOTE — Progress Notes (Signed)
The patient is doing well status post excision of pilonidal disease. Several of her sutures were removed in the office today the wound did not separate the nose no drainage.  The patient was complaining of discomfort particularly on the right side of the incision. There appeared to be some fluid in the wound but no evidence of infection.  I would have the patient return for office on Thursday to have the rest of her sutures removed. At that time whether not she needs to see me will be determined by the nurse followup. I will be glad to see the patient in the future if she should develop any other problems.  She has enough pain medications and I encouraged her to take them as needed and not wait until the pain got a bit out of control. She was using some antibiotic ointment on the wound which was fine. Probably Steri-Strips should be placed on the wound after the dressing the sutures removed on Thursday.

## 2012-05-12 ENCOUNTER — Ambulatory Visit (INDEPENDENT_AMBULATORY_CARE_PROVIDER_SITE_OTHER): Payer: Commercial Managed Care - PPO | Admitting: General Surgery

## 2012-05-12 DIAGNOSIS — Z4802 Encounter for removal of sutures: Secondary | ICD-10-CM

## 2012-05-12 NOTE — Progress Notes (Signed)
Patient comes in status post pilonidal excision. She has four sutures in place. The sutures were removed and area covered with benzoin/steri-strips. The wound has hypergranulation tissue along the midline but is intact. I advised patient follow up with Dr Lindie Spruce for one more visit and call with any problems.

## 2012-05-19 ENCOUNTER — Telehealth (INDEPENDENT_AMBULATORY_CARE_PROVIDER_SITE_OTHER): Payer: Self-pay

## 2012-05-19 NOTE — Telephone Encounter (Signed)
Pt calling to schedule a f/u appt with Dr. Lindie Spruce.  She knows he is unavailable until mid May, but she would like to schedule something then.  Please call her asap.

## 2012-05-21 ENCOUNTER — Telehealth (INDEPENDENT_AMBULATORY_CARE_PROVIDER_SITE_OTHER): Payer: Self-pay | Admitting: General Surgery

## 2012-05-21 NOTE — Telephone Encounter (Signed)
She called the report she had a pilonidal cyst excision earlier this month. She's had some of  her sutures removed. She began having some discomfort and discharge from the area. One suture is still present. I told her to clean the area with peroxide and then shower and clean it twice a day. If the redness spread I told her to call back and we will start her on an antibiotic. I instructed her to call the office Monday to arrange to be seen by Dr. Lindie Spruce.

## 2012-05-23 ENCOUNTER — Telehealth (INDEPENDENT_AMBULATORY_CARE_PROVIDER_SITE_OTHER): Payer: Self-pay | Admitting: *Deleted

## 2012-05-23 ENCOUNTER — Ambulatory Visit (INDEPENDENT_AMBULATORY_CARE_PROVIDER_SITE_OTHER): Payer: Commercial Managed Care - PPO | Admitting: General Surgery

## 2012-05-23 ENCOUNTER — Encounter (INDEPENDENT_AMBULATORY_CARE_PROVIDER_SITE_OTHER): Payer: Self-pay | Admitting: General Surgery

## 2012-05-23 DIAGNOSIS — L988 Other specified disorders of the skin and subcutaneous tissue: Secondary | ICD-10-CM

## 2012-05-23 DIAGNOSIS — L0591 Pilonidal cyst without abscess: Secondary | ICD-10-CM

## 2012-05-23 NOTE — Telephone Encounter (Signed)
Patient called to ask about coming in to have someone examine her to see if a suture remains.  Nurse only appt made for this afternoon.  Patient agreeable with this at this time.

## 2012-05-23 NOTE — Progress Notes (Signed)
Subjective:     Patient ID: Jodi Bates, female   DOB: 08/29/1965, 47 y.o.   MRN: [id number]  HPI The patient is a 47 year old black female who is a couple weeks status post excision of a pilonidal cyst with primary closure by Dr. Lindie Spruce. She was initially doing well but began having some pain and drainage today from the area. She denies any fevers or chills.  Review of Systems     Objective:   Physical Exam On exam there was one stitch left in the gluteal cleft area. This was removed without difficulty. She has a small opening in the gluteal cleft draining some pus. This was probed and evacuated. The wound was then packed with gauze. The husband was instructed on how to pack gauze and the wound.    Assessment:     The patient is status post excision of a pilonidal cyst and now has a superficial wound infection    Plan:     She will start dressing changes tomorrow. She will followup next week with Dr. Lindie Spruce for a wound check

## 2012-05-23 NOTE — Patient Instructions (Signed)
Remove dressing tomorrow and shower.  Try to repack gauze into opening

## 2012-05-31 ENCOUNTER — Encounter (INDEPENDENT_AMBULATORY_CARE_PROVIDER_SITE_OTHER): Payer: Self-pay | Admitting: General Surgery

## 2012-05-31 ENCOUNTER — Encounter (INDEPENDENT_AMBULATORY_CARE_PROVIDER_SITE_OTHER): Payer: Commercial Managed Care - PPO | Admitting: General Surgery

## 2012-05-31 ENCOUNTER — Ambulatory Visit (INDEPENDENT_AMBULATORY_CARE_PROVIDER_SITE_OTHER): Payer: Commercial Managed Care - PPO | Admitting: General Surgery

## 2012-05-31 VITALS — BP 136/86 | HR 60 | Temp 98.5°F | Resp 14 | Ht 64.5 in | Wt 213.2 lb

## 2012-05-31 DIAGNOSIS — Z09 Encounter for follow-up examination after completed treatment for conditions other than malignant neoplasm: Secondary | ICD-10-CM

## 2012-05-31 NOTE — Progress Notes (Signed)
The patient comes in today for reevaluation of her wound. There is only a small area that is still open at the inferior aspect of her wound. This measures approximately 1 x 2 cm in size. It is not deep enough wet-to-dry dressings. If In terms of granulation tissue. There is no evidence of infection.  The upper portion of the wound has healed completely and appears to be doing well. The patient does not require antibiotics.  I told the patient to start to shower and pat the wound dry and then placed Triple Antibiotic ointment on the wound as his twice a day. This should be covered by sterile 4 x 4 gauze. I will see her back in approximately one week for reassessment of her wound.

## 2012-06-06 ENCOUNTER — Encounter: Payer: Self-pay | Admitting: *Deleted

## 2012-06-06 ENCOUNTER — Encounter: Payer: 59 | Attending: Internal Medicine | Admitting: *Deleted

## 2012-06-06 VITALS — Ht 64.5 in | Wt 214.5 lb

## 2012-06-06 DIAGNOSIS — Z713 Dietary counseling and surveillance: Secondary | ICD-10-CM | POA: Insufficient documentation

## 2012-06-06 DIAGNOSIS — R7302 Impaired glucose tolerance (oral): Secondary | ICD-10-CM

## 2012-06-06 DIAGNOSIS — R7309 Other abnormal glucose: Secondary | ICD-10-CM | POA: Insufficient documentation

## 2012-06-06 NOTE — Progress Notes (Signed)
  Medical Nutrition Therapy:  Appt start time: 0900 end time:  1000.  Assessment:  Primary concerns today: Pre-diabetes and weight management..Works as Print production planner in Cardiac unit. Currently on medical leave, eats more regularly then. Lives with her husband, they shop for food together, she prepares her evening meal when husband is at work. She works days and he works nights, both [employer]  MEDICATIONS: see list.   DIETARY INTAKE:  Usual eating pattern includes 3 meals and 3 snacks per day.  Everyday foods include better variety of all food groups.  Avoided foods include beef or pork.    24-hr recall:  B ( AM): used to skip, now tries to have 2 [place] links occasionally an egg OR fruit smoothie  Snk ( AM): protein bar or cucumbers  L ( PM): when working, often brings a salad OR buys a vegetable plate with a small portion of meat, water after the meal Snk ( PM): same as AM, likes popcorn D ( PM): she is by herself so lean meat, occasionally a starch, OR salad, water Snk ( PM): popcorn or nuts Beverages: water  Usual physical activity: used to go to gym for 2hours 3 times a week. Had surgery so on leave right now and not going to gym. Hopes to be cleared to go back soon.  Estimated energy needs: 1400 calories 158 g carbohydrates 105 g protein 39 g fat  Progress Towards Goal(s):  In progress.   Nutritional Diagnosis:  NI-1.5 Excessive energy intake As related to activity level.  As evidenced by BMI of 36.    Intervention:  Nutrition counseling and pre-diabetes education initiated. Discussed basic physiology of diabetes, A1c, Carb Counting and reading food labels, and benefits of increased activity.  Plan:  Aim for 2-3 Carb Choices per meal (30-45 grams) +/- 1 either way  Aim for 0-1 Carbs per snack if hungry  Consider reading food labels for Total Carbohydrate  of foods Consider  increasing your activity level by walking or other activity for 15-30 minutes daily  as tolerated   Handouts given during visit include: Living Well with Diabetes Carb Counting and Food Label handouts Meal Plan Card  Monitoring/Evaluation:  Dietary intake, exercise, reading food labels, and body weight in 4 week(s).

## 2012-06-08 ENCOUNTER — Encounter (INDEPENDENT_AMBULATORY_CARE_PROVIDER_SITE_OTHER): Payer: Self-pay | Admitting: General Surgery

## 2012-06-08 ENCOUNTER — Ambulatory Visit (INDEPENDENT_AMBULATORY_CARE_PROVIDER_SITE_OTHER): Payer: Commercial Managed Care - PPO | Admitting: General Surgery

## 2012-06-08 ENCOUNTER — Encounter (INDEPENDENT_AMBULATORY_CARE_PROVIDER_SITE_OTHER): Payer: Self-pay

## 2012-06-08 VITALS — BP 142/86 | HR 82 | Temp 97.1°F | Resp 16 | Ht 64.5 in | Wt 212.8 lb

## 2012-06-08 DIAGNOSIS — Z09 Encounter for follow-up examination after completed treatment for conditions other than malignant neoplasm: Secondary | ICD-10-CM

## 2012-06-08 NOTE — Progress Notes (Signed)
The patient's wound is healing very well. It is only about a millimeter in size. Small amount of granulation tissue. There is no subcutaneous fluid collection. The patient is not taking any more pain medicine. She will be allowed to go back to work on this Friday. I recommended that she continue to use a doughnut wall sitting at work. This is for protection of a freshly healed wound. She to return to me on an as-needed basis

## 2012-06-16 ENCOUNTER — Encounter (INDEPENDENT_AMBULATORY_CARE_PROVIDER_SITE_OTHER): Payer: Self-pay | Admitting: General Surgery

## 2012-06-16 NOTE — Progress Notes (Signed)
Subjective:     Patient ID: Jodi Bates, female   DOB: Oct 19, 1965, 47 y.o.   MRN: [id number]  HPI The patient is a 47 year old female who is a couple weeks status post excision of a pilonidal cyst by Dr. Lindie Spruce. She returns today after having said some drainage from the area. She has had some of her stitches removed. She denies any fevers or chills.  Review of Systems     Objective:   Physical Exam On exam the wound has opened a small amount along the midportion of the incision. Was able to probe this with a Q-tip and evacuated a small amount appearing fluid. The rest of the stitches were removed. She tolerated this well.    Assessment:     The patient is status post excision of a pilonidal cyst     Plan:     The patient will continue to shower and keep the area clean and dry with dressing changes. She will followup with Dr. Lindie Spruce in the next week or so for another wound check

## 2012-06-18 NOTE — Patient Instructions (Signed)
Plan:  Aim for 2-3 Carb Choices per meal (30-45 grams) +/- 1 either way  Aim for 0-1 Carbs per snack if hungry  Consider reading food labels for Total Carbohydrate  of foods Consider  increasing your activity level by walking or other activity for 15-30 minutes daily as tolerated

## 2012-08-16 ENCOUNTER — Encounter: Payer: 59 | Attending: Internal Medicine | Admitting: *Deleted

## 2012-08-16 ENCOUNTER — Encounter: Payer: Self-pay | Admitting: *Deleted

## 2012-08-16 VITALS — Ht 64.5 in | Wt 208.9 lb

## 2012-08-16 DIAGNOSIS — Z713 Dietary counseling and surveillance: Secondary | ICD-10-CM | POA: Insufficient documentation

## 2012-08-16 DIAGNOSIS — R7309 Other abnormal glucose: Secondary | ICD-10-CM | POA: Insufficient documentation

## 2012-08-16 DIAGNOSIS — R7302 Impaired glucose tolerance (oral): Secondary | ICD-10-CM

## 2012-08-16 NOTE — Progress Notes (Signed)
  Medical Nutrition Therapy:  Appt start time: 0800 end time:  0830.  Assessment:  Primary concerns today: Pre-diabetes and weight management follow up visit. Weight is stable, is eating half of meals in restaurant, looking up nutrition information on [employer] APP, is doing 7 Minute Workout occasionally. Feels clothes are fitting better even without any weight loss today. Also states she is pre-menstrual so there is some water weight involved today too.  MEDICATIONS: see list.   DIETARY INTAKE:  Usual eating pattern includes 3 meals and 3 snacks per day.  Everyday foods include better variety of all food groups.  Avoided foods include beef or pork.    24-hr recall:  B ( AM): used to skip, now tries to have 2 [place] links occasionally an egg OR fruit smoothie  Snk ( AM): protein bar or cucumbers  L ( PM): when working, often brings a salad OR buys a vegetable plate with a small portion of meat, water after the meal Snk ( PM): same as AM, likes popcorn D ( PM): she is by herself so lean meat, occasionally a starch, OR salad, water Snk ( PM): popcorn or nuts Beverages: water  Usual physical activity: limited activity as she is still recovering from surgery. She is doing The 7 Minute Workout a couple of times a week but not consistently  Estimated energy needs: 1400 calories 158 g carbohydrates 105 g protein 39 g fat  Progress Towards Goal(s):  In progress.   Nutritional Diagnosis:  NI-1.5 Excessive energy intake As related to activity level.  As evidenced by BMI of 36 now down to 35.4    Intervention:  Commended her on her food choices and no weight gain. Discussed options for increasing her activity level as primary need to provide weight loss from here. Reviewed Arm Chair exercise options that she can do from home and acknowledged value of walking or going to gym with a friend to increase accountability.  Plan:  Continue to aim for 2-3 Carb Choices per meal (30-45 grams) +/- 1  either way  Continue to aim for 0-1 Carbs per snack if hungry  Continue reading food labels for Total Carbohydrate  of foods Consider  increasing your activity level by Arm Chair Exercises or other activity for 15-30 minutes daily as tolerated    Handouts given during visit include: Arm Chair Exericise resources  Monitoring/Evaluation:  Dietary intake, exercise, reading food labels, and body weight in 8 week(s).

## 2012-08-16 NOTE — Patient Instructions (Signed)
Plan:  Continue to aim for 2-3 Carb Choices per meal (30-45 grams) +/- 1 either way  Continue to aim for 0-1 Carbs per snack if hungry  Continue reading food labels for Total Carbohydrate  of foods Consider  increasing your activity level by Arm Chair Exercises or other activity for 15-30 minutes daily as tolerated

## 2012-10-19 ENCOUNTER — Encounter: Payer: Self-pay | Admitting: *Deleted

## 2012-10-19 ENCOUNTER — Encounter: Payer: 59 | Attending: Internal Medicine | Admitting: *Deleted

## 2012-10-19 VITALS — Ht 64.5 in | Wt 207.2 lb

## 2012-10-19 DIAGNOSIS — Z713 Dietary counseling and surveillance: Secondary | ICD-10-CM | POA: Insufficient documentation

## 2012-10-19 DIAGNOSIS — R7309 Other abnormal glucose: Secondary | ICD-10-CM | POA: Insufficient documentation

## 2012-10-19 DIAGNOSIS — R7302 Impaired glucose tolerance (oral): Secondary | ICD-10-CM

## 2012-10-19 NOTE — Progress Notes (Signed)
  Medical Nutrition Therapy:  Appt start time: 0800 end time:  0830.  Assessment:  Primary concerns today: Pre-diabetes and weight management follow up visit. Weight is stable, is continuing to eat half or less of meals in restaurant, looking up nutrition information on [employer]orie [employer] APP, is doing 7 Minute Workout occasionally. Feels clothes are fitting better even without any weight loss today. Also states she is moving into menopause so has shifts in energy level along with night sweats which affect her sleep patterns. She enjoys doing Editor, commissioning workouts for 15 minutes about 3 days a week and is ready to increase to 5 days. She states she checked her BG on her husband's meter last week and it was 90 mg/dl.   MEDICATIONS: see list.   DIETARY INTAKE:  Usual eating pattern includes 3 meals and 3 snacks per day.  Everyday foods include better variety of all food groups.  Avoided foods include beef or pork.    24-hr recall:  B ( AM): used to skip, now tries to have 2 [place] links occasionally an egg OR fruit smoothie  Snk ( AM): protein bar or cucumbers  L ( PM): when working, often brings a salad OR buys a vegetable plate with a small portion of meat, water after the meal Snk ( PM): same as AM, likes popcorn D ( PM): she is by herself so lean meat, occasionally a starch, OR salad, water Snk ( PM): popcorn or nuts Beverages: water  Usual physical activity: Cardio video workouts for 15 minutes about 3 days a week and is ready to increase to 5 days.  Estimated energy needs: 1400 calories 158 g carbohydrates 105 g protein 39 g fat  Progress Towards Goal(s):  In progress.   Nutritional Diagnosis:  NI-1.5 Excessive energy intake As related to activity level.  As evidenced by BMI of 36 now down to 35.1    Intervention:  Commended her on her food choices especially when eating out. Discussed options for increasing her activity level as primary need to provide weight loss from here. Also  suggested adding in Yoga as a method of stress reduction and perhaps to help with energy level and menopause symptoms.  Plan:  Continue to aim for 2-3 Carb Choices per meal (30-45 grams) +/- 1 either way  Continue to aim for 0-1 Carbs per snack if hungry  Continue reading food labels for Total Carbohydrate  of foods Consider  increasing your activity level by Cardio video or other activity for 15-30 minutes daily as tolerated    Handouts given during visit include: no new handouts at this visit  Monitoring/Evaluation:  Dietary intake, exercise, reading food labels, and body weight PRN

## 2012-11-17 ENCOUNTER — Other Ambulatory Visit: Payer: Self-pay

## 2012-11-17 DIAGNOSIS — Z1231 Encounter for screening mammogram for malignant neoplasm of breast: Secondary | ICD-10-CM

## 2012-11-28 ENCOUNTER — Other Ambulatory Visit: Payer: Self-pay | Admitting: Internal Medicine

## 2012-12-01 ENCOUNTER — Other Ambulatory Visit: Payer: Self-pay

## 2012-12-09 ENCOUNTER — Ambulatory Visit: Payer: 59

## 2012-12-26 ENCOUNTER — Other Ambulatory Visit: Payer: Self-pay | Admitting: Internal Medicine

## 2012-12-27 ENCOUNTER — Encounter: Payer: Self-pay | Admitting: Internal Medicine

## 2013-01-02 ENCOUNTER — Other Ambulatory Visit: Payer: 59 | Admitting: Internal Medicine

## 2013-01-02 DIAGNOSIS — I10 Essential (primary) hypertension: Secondary | ICD-10-CM

## 2013-01-02 DIAGNOSIS — R7302 Impaired glucose tolerance (oral): Secondary | ICD-10-CM

## 2013-01-02 DIAGNOSIS — R5383 Other fatigue: Secondary | ICD-10-CM

## 2013-01-02 DIAGNOSIS — Z130 Encounter for screening for diseases of the blood and blood-forming organs and certain disorders involving the immune mechanism: Secondary | ICD-10-CM

## 2013-01-02 LAB — COMPREHENSIVE METABOLIC PANEL
AST: 14 U/L (ref 0–37)
Albumin: 4 g/dL (ref 3.5–5.2)
Alkaline Phosphatase: 66 U/L (ref 39–117)
Calcium: 9.3 mg/dL (ref 8.4–10.5)
Chloride: 105 mEq/L (ref 96–112)
Potassium: 4.3 mEq/L (ref 3.5–5.3)
Sodium: 142 mEq/L (ref 135–145)
Total Protein: 6.5 g/dL (ref 6.0–8.3)

## 2013-01-02 LAB — LIPID PANEL: LDL Cholesterol: 112 mg/dL — ABNORMAL HIGH (ref 0–99)

## 2013-01-02 LAB — HEMOGLOBIN A1C
Hgb A1c MFr Bld: 5.8 % — ABNORMAL HIGH (ref <5.7)
Mean Plasma Glucose: 120 mg/dL — ABNORMAL HIGH (ref <117)

## 2013-01-02 LAB — CBC WITH DIFFERENTIAL/PLATELET
Basophils Absolute: 0 10*3/uL (ref 0.0–0.1)
HCT: 38.4 % (ref 36.0–46.0)
Hemoglobin: 12.9 g/dL (ref 12.0–15.0)
Lymphocytes Relative: 38 % (ref 12–46)
Lymphs Abs: 1.5 10*3/uL (ref 0.7–4.0)
Monocytes Absolute: 0.4 10*3/uL (ref 0.1–1.0)
Monocytes Relative: 10 % (ref 3–12)
Neutro Abs: 1.9 10*3/uL (ref 1.7–7.7)
RBC: 4.44 MIL/uL (ref 3.87–5.11)
WBC: 3.8 10*3/uL — ABNORMAL LOW (ref 4.0–10.5)

## 2013-01-03 ENCOUNTER — Encounter: Payer: Self-pay | Admitting: Internal Medicine

## 2013-01-03 ENCOUNTER — Ambulatory Visit (INDEPENDENT_AMBULATORY_CARE_PROVIDER_SITE_OTHER): Payer: 59 | Admitting: Internal Medicine

## 2013-01-03 VITALS — BP 122/84 | HR 60 | Temp 97.7°F | Ht 64.5 in | Wt 210.0 lb

## 2013-01-03 DIAGNOSIS — I10 Essential (primary) hypertension: Secondary | ICD-10-CM

## 2013-01-03 DIAGNOSIS — J45909 Unspecified asthma, uncomplicated: Secondary | ICD-10-CM

## 2013-01-03 DIAGNOSIS — Z0000 Encounter for general adult medical examination without abnormal findings: Secondary | ICD-10-CM

## 2013-01-03 DIAGNOSIS — Z780 Asymptomatic menopausal state: Secondary | ICD-10-CM | POA: Insufficient documentation

## 2013-01-03 DIAGNOSIS — Z01419 Encounter for gynecological examination (general) (routine) without abnormal findings: Secondary | ICD-10-CM

## 2013-01-03 DIAGNOSIS — Z23 Encounter for immunization: Secondary | ICD-10-CM

## 2013-01-03 DIAGNOSIS — N951 Menopausal and female climacteric states: Secondary | ICD-10-CM

## 2013-01-03 DIAGNOSIS — Z124 Encounter for screening for malignant neoplasm of cervix: Secondary | ICD-10-CM

## 2013-01-03 DIAGNOSIS — B373 Candidiasis of vulva and vagina: Secondary | ICD-10-CM

## 2013-01-03 LAB — POCT WET PREP (WET MOUNT)

## 2013-01-03 LAB — VITAMIN D 25 HYDROXY (VIT D DEFICIENCY, FRACTURES): Vit D, 25-Hydroxy: 48 ng/mL (ref 30–89)

## 2013-01-03 LAB — POCT URINALYSIS DIPSTICK
Protein, UA: NEGATIVE
Spec Grav, UA: 1.015
Urobilinogen, UA: 0.2

## 2013-01-03 MED ORDER — ALBUTEROL SULFATE HFA 108 (90 BASE) MCG/ACT IN AERS: 2.0000 | INHALATION_SPRAY | Freq: Four times a day (QID) | RESPIRATORY_TRACT | Status: DC | PRN

## 2013-01-03 MED ORDER — LISINOPRIL-HYDROCHLOROTHIAZIDE 20-25 MG PO TABS: ORAL_TABLET | ORAL | Status: DC

## 2013-01-03 MED ORDER — TERCONAZOLE 0.4 % VA CREA: 1.0000 | TOPICAL_CREAM | Freq: Every day | VAGINAL | Status: DC

## 2013-01-03 MED ORDER — AMLODIPINE BESYLATE 5 MG PO TABS: ORAL_TABLET | ORAL | Status: DC

## 2013-01-03 MED ORDER — FLUTICASONE-SALMETEROL 250-50 MCG/DOSE IN AEPB: 1.0000 | INHALATION_SPRAY | Freq: Two times a day (BID) | RESPIRATORY_TRACT | Status: DC
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
## 2013-01-03 NOTE — Patient Instructions (Addendum)
Watch diet and exercise. Return in 6 months. Use vaginal cream for yeast infection qhs x 7 days.

## 2013-01-05 ENCOUNTER — Ambulatory Visit
Admission: RE | Admit: 2013-01-05 | Discharge: 2013-01-05 | Disposition: A | Discharge status: Home / Self Care | Payer: 59 | Source: Ambulatory Visit

## 2013-01-05 DIAGNOSIS — Z1231 Encounter for screening mammogram for malignant neoplasm of breast: Secondary | ICD-10-CM

## 2013-02-07 ENCOUNTER — Encounter: Payer: Self-pay | Admitting: Internal Medicine

## 2013-02-09 ENCOUNTER — Encounter (INDEPENDENT_AMBULATORY_CARE_PROVIDER_SITE_OTHER): Payer: Self-pay | Admitting: General Surgery

## 2013-02-09 ENCOUNTER — Ambulatory Visit (INDEPENDENT_AMBULATORY_CARE_PROVIDER_SITE_OTHER): Payer: Commercial Managed Care - PPO | Admitting: General Surgery

## 2013-02-09 VITALS — BP 128/84 | HR 71 | Temp 97.9°F | Resp 16 | Ht 64.5 in | Wt 206.8 lb

## 2013-02-09 DIAGNOSIS — K612 Anorectal abscess: Secondary | ICD-10-CM

## 2013-02-09 DIAGNOSIS — K611 Rectal abscess: Secondary | ICD-10-CM

## 2013-02-09 NOTE — Progress Notes (Signed)
This patient is well known to me with pilonidal disease and perianal hidradenitis. She comes in today with a new right-sided perirectal abscess. This could be an extension of her hidradenitis disease there is an extension down towards the anus.  On examination she has a fluctuant very tender mass on the right side of the gluteal cheek on the medial portion of the gluteal and perirectal fold. Because I do have time in the operating room tomorrow I'm scheduling her for an operative incision and drainage of a large perirectal abscess. She will get preoperative antibiotics and also A fleets enema the morning prior to surgery

## 2013-02-10 ENCOUNTER — Encounter (HOSPITAL_COMMUNITY): Payer: Self-pay | Admitting: *Deleted

## 2013-02-10 ENCOUNTER — Ambulatory Visit (HOSPITAL_COMMUNITY): Payer: 59

## 2013-02-10 ENCOUNTER — Ambulatory Visit (HOSPITAL_COMMUNITY)
Admission: RE | Admit: 2013-02-10 | Discharge: 2013-02-10 | Disposition: A | Discharge status: Home / Self Care | Payer: 59 | Source: Ambulatory Visit | Attending: General Surgery | Admitting: General Surgery

## 2013-02-10 ENCOUNTER — Encounter: Payer: Self-pay | Admitting: General Surgery

## 2013-02-10 ENCOUNTER — Encounter (HOSPITAL_COMMUNITY)
Admission: RE | Disposition: A | Discharge status: Home / Self Care | Payer: Self-pay | Source: Ambulatory Visit | Attending: General Surgery

## 2013-02-10 ENCOUNTER — Encounter (HOSPITAL_COMMUNITY): Payer: 59 | Admitting: Certified Registered"

## 2013-02-10 ENCOUNTER — Ambulatory Visit (HOSPITAL_COMMUNITY): Payer: 59 | Admitting: Certified Registered"

## 2013-02-10 DIAGNOSIS — K611 Rectal abscess: Secondary | ICD-10-CM

## 2013-02-10 DIAGNOSIS — J45909 Unspecified asthma, uncomplicated: Secondary | ICD-10-CM | POA: Insufficient documentation

## 2013-02-10 DIAGNOSIS — K612 Anorectal abscess: Secondary | ICD-10-CM

## 2013-02-10 DIAGNOSIS — I10 Essential (primary) hypertension: Secondary | ICD-10-CM | POA: Insufficient documentation

## 2013-02-10 HISTORY — PX: INCISION AND DRAINAGE PERIRECTAL ABSCESS: SHX1804

## 2013-02-10 LAB — CBC WITH DIFFERENTIAL/PLATELET
Basophils Absolute: 0 10*3/uL (ref 0.0–0.1)
Basophils Relative: 0 % (ref 0–1)
EOS ABS: 0.1 10*3/uL (ref 0.0–0.7)
Eosinophils Relative: 2 % (ref 0–5)
HCT: 36.8 % (ref 36.0–46.0)
HEMOGLOBIN: 12.5 g/dL (ref 12.0–15.0)
LYMPHS ABS: 1.7 10*3/uL (ref 0.7–4.0)
LYMPHS PCT: 35 % (ref 12–46)
MCH: 29.7 pg (ref 26.0–34.0)
MCHC: 34 g/dL (ref 30.0–36.0)
MCV: 87.4 fL (ref 78.0–100.0)
MONOS PCT: 11 % (ref 3–12)
Monocytes Absolute: 0.5 10*3/uL (ref 0.1–1.0)
NEUTROS PCT: 51 % (ref 43–77)
Neutro Abs: 2.4 10*3/uL (ref 1.7–7.7)
Platelets: 243 10*3/uL (ref 150–400)
RBC: 4.21 MIL/uL (ref 3.87–5.11)
RDW: 12.7 % (ref 11.5–15.5)
WBC: 4.7 10*3/uL (ref 4.0–10.5)

## 2013-02-10 LAB — BASIC METABOLIC PANEL
BUN: 16 mg/dL (ref 6–23)
CO2: 25 meq/L (ref 19–32)
Calcium: 9.3 mg/dL (ref 8.4–10.5)
Chloride: 103 mEq/L (ref 96–112)
Creatinine, Ser: 0.76 mg/dL (ref 0.50–1.10)
GFR calc Af Amer: >90 mL/min (ref >90)
Glucose, Bld: 103 mg/dL — ABNORMAL HIGH (ref 70–99)
POTASSIUM: 3.5 meq/L — AB (ref 3.7–5.3)
Sodium: 142 mEq/L (ref 137–147)

## 2013-02-10 LAB — HCG, SERUM, QUALITATIVE: Preg, Serum: NEGATIVE

## 2013-02-10 SURGERY — INCISION AND DRAINAGE, ABSCESS, PERIRECTAL
Anesthesia: General | Laterality: Right

## 2013-02-10 SURGERY — INCISION AND DRAINAGE, ABSCESS, PERIRECTAL
Anesthesia: General | Site: Buttocks

## 2013-02-10 MED ORDER — ONDANSETRON HCL 4 MG/2ML IJ SOLN
INTRAMUSCULAR | Status: DC | PRN
  Administered 2013-02-10: 4 mg via INTRAVENOUS

## 2013-02-10 MED ORDER — OXYCODONE-ACETAMINOPHEN 5-325 MG PO TABS: 1.0000 | ORAL_TABLET | ORAL | Status: DC | PRN

## 2013-02-10 MED ORDER — OXYCODONE HCL 5 MG PO TABS
ORAL_TABLET | ORAL | Status: AC
  Filled 2013-02-10: qty 1

## 2013-02-10 MED ORDER — ROCURONIUM BROMIDE 100 MG/10ML IV SOLN
INTRAVENOUS | Status: DC | PRN
  Administered 2013-02-10: 40 mg via INTRAVENOUS

## 2013-02-10 MED ORDER — ARTIFICIAL TEARS OP OINT
TOPICAL_OINTMENT | OPHTHALMIC | Status: DC | PRN
  Administered 2013-02-10: 1 via OPHTHALMIC

## 2013-02-10 MED ORDER — FLEET ENEMA 7-19 GM/118ML RE ENEM
ENEMA | RECTAL | Status: AC
  Filled 2013-02-10: qty 1

## 2013-02-10 MED ORDER — HYDROMORPHONE HCL PF 1 MG/ML IJ SOLN
INTRAMUSCULAR | Status: AC
  Filled 2013-02-10: qty 1

## 2013-02-10 MED ORDER — GLYCOPYRROLATE 0.2 MG/ML IJ SOLN
INTRAMUSCULAR | Status: DC | PRN
  Administered 2013-02-10: .8 mg via INTRAVENOUS

## 2013-02-10 MED ORDER — OXYCODONE HCL 5 MG/5ML PO SOLN: 5.0000 mg | Freq: Once | ORAL | Status: AC | PRN

## 2013-02-10 MED ORDER — PROPOFOL 10 MG/ML IV BOLUS
INTRAVENOUS | Status: DC | PRN
  Administered 2013-02-10: 30 mg via INTRAVENOUS
  Administered 2013-02-10: 150 mg via INTRAVENOUS

## 2013-02-10 MED ORDER — FENTANYL CITRATE 0.05 MG/ML IJ SOLN
INTRAMUSCULAR | Status: DC | PRN
  Administered 2013-02-10 (×2): 50 ug via INTRAVENOUS

## 2013-02-10 MED ORDER — FLEET ENEMA 7-19 GM/118ML RE ENEM
1.0000 | ENEMA | Freq: Once | RECTAL | Status: AC
  Administered 2013-02-10: 1 via RECTAL

## 2013-02-10 MED ORDER — LIDOCAINE HCL (CARDIAC) 20 MG/ML IV SOLN
INTRAVENOUS | Status: DC | PRN
  Administered 2013-02-10: 60 mg via INTRAVENOUS

## 2013-02-10 MED ORDER — BUPIVACAINE LIPOSOME 1.3 % IJ SUSP
20.0000 mL | Freq: Once | INTRAMUSCULAR | Status: AC
  Administered 2013-02-10: 20 mL
  Filled 2013-02-10: qty 20

## 2013-02-10 MED ORDER — DEXTROSE 5 % IV SOLN
2.0000 g | INTRAVENOUS | Status: AC
  Administered 2013-02-10: 2 g via INTRAVENOUS
  Filled 2013-02-10 (×2): qty 2

## 2013-02-10 MED ORDER — OXYCODONE HCL 5 MG PO TABS
5.0000 mg | ORAL_TABLET | Freq: Once | ORAL | Status: AC | PRN
  Administered 2013-02-10: 5 mg via ORAL

## 2013-02-10 MED ORDER — HYDROMORPHONE HCL PF 1 MG/ML IJ SOLN
0.2500 mg | INTRAMUSCULAR | Status: DC | PRN
  Administered 2013-02-10 (×2): 0.5 mg via INTRAVENOUS

## 2013-02-10 MED ORDER — MIDAZOLAM HCL 5 MG/5ML IJ SOLN
INTRAMUSCULAR | Status: DC | PRN
  Administered 2013-02-10: 2 mg via INTRAVENOUS

## 2013-02-10 MED ORDER — NEOSTIGMINE METHYLSULFATE 1 MG/ML IJ SOLN
INTRAMUSCULAR | Status: DC | PRN
  Administered 2013-02-10: 5 mg via INTRAVENOUS

## 2013-02-10 MED ORDER — AMOXICILLIN-POT CLAVULANATE 875-125 MG PO TABS: 1.0000 | ORAL_TABLET | Freq: Two times a day (BID) | ORAL | Status: DC

## 2013-02-10 MED ORDER — LACTATED RINGERS IV SOLN
INTRAVENOUS | Status: DC | PRN
  Administered 2013-02-10: 10:00:00 via INTRAVENOUS

## 2013-02-10 SURGICAL SUPPLY — 39 items
BLADE SURG 10 STRL SS (BLADE) ×2 IMPLANT
BLADE SURG 15 STRL LF DISP TIS (BLADE) ×1 IMPLANT
BLADE SURG 15 STRL SS (BLADE) ×2
CANISTER SUCTION 2500CC (MISCELLANEOUS) ×2 IMPLANT
CLEANER TIP ELECTROSURG 2X2 (MISCELLANEOUS) ×2 IMPLANT
COVER SURGICAL LIGHT HANDLE (MISCELLANEOUS) ×2 IMPLANT
DRSG PAD ABDOMINAL 8X10 ST (GAUZE/BANDAGES/DRESSINGS) ×2 IMPLANT
ELECT REM PT RETURN 9FT ADLT (ELECTROSURGICAL) ×2
ELECTRODE REM PT RTRN 9FT ADLT (ELECTROSURGICAL) ×1 IMPLANT
GAUZE PACKING IODOFORM 1 (PACKING) IMPLANT
GAUZE PACKING IODOFORM 1/2 (PACKING) ×1 IMPLANT
GAUZE SPONGE 4X4 16PLY XRAY LF (GAUZE/BANDAGES/DRESSINGS) ×2 IMPLANT
GLOVE BIOGEL PI IND STRL 8 (GLOVE) ×1 IMPLANT
GLOVE BIOGEL PI INDICATOR 8 (GLOVE) ×2
GLOVE ECLIPSE 7.5 STRL STRAW (GLOVE) ×2 IMPLANT
GLOVE SURG SS PI 7.0 STRL IVOR (GLOVE) ×1 IMPLANT
GLOVE SURG SS PI 7.5 STRL IVOR (GLOVE) ×2 IMPLANT
GOWN STRL NON-REIN LRG LVL3 (GOWN DISPOSABLE) ×4 IMPLANT
KIT BASIN OR (CUSTOM PROCEDURE TRAY) ×2 IMPLANT
KIT ROOM TURNOVER OR (KITS) ×2 IMPLANT
NDL 18GX1X1/2 (RX/OR ONLY) (NEEDLE) IMPLANT
NEEDLE 18GX1X1/2 (RX/OR ONLY) (NEEDLE) ×2 IMPLANT
NEEDLE 22X1 1/2 (OR ONLY) (NEEDLE) ×1 IMPLANT
NS IRRIG 1000ML POUR BTL (IV SOLUTION) ×2 IMPLANT
PACK LITHOTOMY IV (CUSTOM PROCEDURE TRAY) ×2 IMPLANT
PAD ARMBOARD 7.5X6 YLW CONV (MISCELLANEOUS) ×2 IMPLANT
PENCIL BUTTON HOLSTER BLD 10FT (ELECTRODE) ×2 IMPLANT
SPONGE GAUZE 4X4 12PLY (GAUZE/BANDAGES/DRESSINGS) ×2 IMPLANT
SURGILUBE 2OZ TUBE FLIPTOP (MISCELLANEOUS) ×2 IMPLANT
SWAB COLLECTION DEVICE MRSA (MISCELLANEOUS) IMPLANT
SYR BULB IRRIGATION 50ML (SYRINGE) ×1 IMPLANT
SYR CONTROL 10ML LL (SYRINGE) ×2 IMPLANT
TOWEL OR 17X24 6PK STRL BLUE (TOWEL DISPOSABLE) ×2 IMPLANT
TOWEL OR 17X26 10 PK STRL BLUE (TOWEL DISPOSABLE) ×2 IMPLANT
TUBE ANAEROBIC SPECIMEN COL (MISCELLANEOUS) IMPLANT
TUBE CONNECTING 12X1/4 (SUCTIONS) ×2 IMPLANT
UNDERPAD 30X30 INCONTINENT (UNDERPADS AND DIAPERS) ×2 IMPLANT
WATER STERILE IRR 1000ML POUR (IV SOLUTION) IMPLANT
YANKAUER SUCT BULB TIP NO VENT (SUCTIONS) ×2 IMPLANT

## 2013-02-10 NOTE — Anesthesia Preprocedure Evaluation (Addendum)
Anesthesia Evaluation  Patient identified by MRN, date of birth, ID band Patient awake    Reviewed: Allergy & Precautions, H&P , NPO status , Patient's Chart, lab work & pertinent test results  Airway Mallampati: II TM Distance: >3 FB Neck ROM: Full    Dental no notable dental hx. (+) Teeth Intact and Dental Advisory Given   Pulmonary asthma ,  breath sounds clear to auscultation  Pulmonary exam normal       Cardiovascular hypertension, On Medications Rhythm:Regular Rate:Normal     Neuro/Psych  Headaches, negative psych ROS   GI/Hepatic negative GI ROS, Neg liver ROS,   Endo/Other  negative endocrine ROS  Renal/GU negative Renal ROS  negative genitourinary   Musculoskeletal   Abdominal   Peds  Hematology negative hematology ROS (+)   Anesthesia Other Findings   Reproductive/Obstetrics negative OB ROS                          Anesthesia Physical Anesthesia Plan  ASA: II  Anesthesia Plan: General   Post-op Pain Management:    Induction: Intravenous  Airway Management Planned: Oral ETT  Additional Equipment:   Intra-op Plan:   Post-operative Plan: Extubation in OR  Informed Consent: I have reviewed the patients History and Physical, chart, labs and discussed the procedure including the risks, benefits and alternatives for the proposed anesthesia with the patient or authorized representative who has indicated his/her understanding and acceptance.   Dental advisory given  Plan Discussed with: CRNA  Anesthesia Plan Comments:         Anesthesia Quick Evaluation

## 2013-02-10 NOTE — Discharge Instructions (Signed)
Peri-Rectal Abscess Your caregiver has diagnosed you as having a peri-rectal abscess. This is an infected area near the rectum that is filled with pus. If the abscess is near the surface of the skin, your caregiver may open (incise) the area and drain the pus. HOME CARE INSTRUCTIONS   If your abscess was opened up and drained. A small piece of gauze may be placed in the opening so that it can drain. Do not remove the gauze unless directed by your caregiver (2 days)  A loose dressing may be placed over the abscess site. Change the dressing as often as necessary to keep it clean and dry.  After the drain is removed, the area may be washed with a gentle antiseptic (soap) four times per day.  A warm sitz bath, warm packs or heating pad may be used for pain relief, taking care not to burn yourself.  Try to perform at least twice daily  Return for a wound check as directed.  An "inflatable doughnut" may be used for sitting with added comfort. These can be purchased at a drugstore or medical supply house.  To reduce pain and straining with bowel movements, eat a high fiber diet with plenty of fruits and vegetables. Use stool softeners as recommended by your caregiver. This is especially important if narcotic type pain medications were prescribed as these may cause marked constipation.  Only take over-the-counter or prescription medicines for pain, discomfort, or fever as directed by your caregiver. SEEK IMMEDIATE MEDICAL CARE IF:   You have increasing pain that is not controlled by medication.  There is increased inflammation (redness), swelling, bleeding, or drainage from the area.  An oral temperature above 102 F (38.9 C) develops.  You develop chills or generalized malaise (feel lethargic or feel "washed out").  You develop any new symptoms (problems) you feel may be related to your present problem. Document Released: 01/10/2000 Document Revised: 04/06/2011 Document Reviewed:  01/10/2008 [facility] Patient Information 2014 [place].

## 2013-02-10 NOTE — Op Note (Signed)
OPERATIVE REPORT  DATE OF OPERATION: 02/10/2013  PATIENT:  Jodi Bates  48 y.o. female  PRE-OPERATIVE DIAGNOSIS:  rectal abscess  POST-OPERATIVE DIAGNOSIS:  rectal abscess  PROCEDURE:  Procedure(s): IRRIGATION AND DEBRIDEMENT PERIRECTAL ABSCESSes x 2  SURGEON:  Surgeon(s): Gwenyth Ober, MD  ASSISTANT: None  ANESTHESIA:   general  EBL: 30 ml  BLOOD ADMINISTERED: none  DRAINS: none   SPECIMEN:  Source of Specimen:  Perirectal drainage for micro  COUNTS CORRECT:  YES  PROCEDURE DETAILS: The patient was taken to the operating room and placed on the table in supine position. After an adequate general endotracheal anesthetic was administered, she was placed on the table in the jackknife prone position and prepped and draped in the usual sterile manner.  A proper time out was performed identifying the patient and the procedure to be performed. The gluteal cheeks have been separated by tape prior to prepping. We subsequently aspirated the right gluteal abscess using an 18-gauge needle. Only a small amount of purulent fluid was aspirated. Slightly more inferior and medial towards the anus there was a fluctuant area which we incised with a #15 blade removing a moderate amount of purulent fluid and blood. There was a chronic inflammatory cavity in that area which we opened up using a #15 blade and electrocautery. We irrigated with saline solution and subsequently packed it with approximately 2 feet of half inch iodoform Nu Gauze.  Slightly more inferior and again towards the anus was a 2 cm to 3 cm abscess cavity which was opened with a #15 blade and subsequently drain. 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 However connection to the anal rectal crypts was not noted.  The second abscess cavity was subsequently packed with approximately 10 inches of the half-inch iodoform Nu Gauze. A total of 20 cc  Exparel was injected into the wounds a sterile dressing was applied. All needle counts, sponge counts, and instrument counts were correct  PATIENT DISPOSITION:  PACU - hemodynamically stable.   Gwenyth Ober 1/16/201511:27 AM

## 2013-02-10 NOTE — Anesthesia Postprocedure Evaluation (Signed)
  Anesthesia Post-op Note  Patient: Jodi Bates  Procedure(s) Performed: Procedure(s): IRRIGATION AND DEBRIDEMENT PERIRECTAL ABSCESS (N/A)  Patient Location: PACU  Anesthesia Type:General  Level of Consciousness: awake and alert   Airway and Oxygen Therapy: Patient Spontanous Breathing  Post-op Pain: mild  Post-op Assessment: Post-op Vital signs reviewed, Patient's Cardiovascular Status Stable and Respiratory Function Stable  Post-op Vital Signs: Reviewed  Filed Vitals:   02/10/13 1200  BP: 133/84  Pulse: 55  Temp:   Resp: 16    Complications: No apparent anesthesia complications

## 2013-02-10 NOTE — Transfer of Care (Signed)
Immediate Anesthesia Transfer of Care Note  Patient: Jodi Bates  Procedure(s) Performed: Procedure(s): IRRIGATION AND DEBRIDEMENT PERIRECTAL ABSCESS (N/A)  Patient Location: PACU  Anesthesia Type:General  Level of Consciousness: awake, alert  and oriented  Airway & Oxygen Therapy: Patient Spontanous Breathing and Patient connected to nasal cannula oxygen  Post-op Assessment: Report given to PACU RN, Post -op Vital signs reviewed and stable and Patient moving all extremities X 4  Post vital signs: Reviewed and stable  Complications: No apparent anesthesia complications

## 2013-02-10 NOTE — Anesthesia Procedure Notes (Signed)
Date/Time: 02/10/2013 10:33 AM Performed by: Gaylene Brooks Pre-anesthesia Checklist: Patient identified, Timeout performed, Emergency Drugs available, Suction available and Patient being monitored Patient Re-evaluated:Patient Re-evaluated prior to inductionOxygen Delivery Method: Circle system utilized Preoxygenation: Pre-oxygenation with 100% oxygen Intubation Type: IV induction Ventilation: Mask ventilation without difficulty Laryngoscope Size: Miller and 2 Grade View: Grade I Tube type: Oral Tube size: 7.0 mm Number of attempts: 1 Airway Equipment and Method: Stylet Placement Confirmation: ETT inserted through vocal cords under direct vision,  positive ETCO2,  CO2 detector and breath sounds checked- equal and bilateral Secured at: 22 cm Tube secured with: Tape Dental Injury: Teeth and Oropharynx as per pre-operative assessment

## 2013-02-13 LAB — CULTURE, ROUTINE-ABSCESS: Culture: NO GROWTH

## 2013-02-14 ENCOUNTER — Encounter (HOSPITAL_COMMUNITY): Payer: Self-pay | Admitting: General Surgery

## 2013-02-15 LAB — ANAEROBIC CULTURE

## 2013-03-07 ENCOUNTER — Ambulatory Visit (INDEPENDENT_AMBULATORY_CARE_PROVIDER_SITE_OTHER): Payer: Commercial Managed Care - PPO | Admitting: General Surgery

## 2013-03-07 ENCOUNTER — Encounter (INDEPENDENT_AMBULATORY_CARE_PROVIDER_SITE_OTHER): Payer: Self-pay | Admitting: General Surgery

## 2013-03-07 VITALS — BP 124/82 | HR 68 | Resp 12 | Ht 64.5 in | Wt 204.4 lb

## 2013-03-07 DIAGNOSIS — IMO0002 Reserved for concepts with insufficient information to code with codable children: Secondary | ICD-10-CM

## 2013-03-07 DIAGNOSIS — L02419 Cutaneous abscess of limb, unspecified: Secondary | ICD-10-CM | POA: Insufficient documentation

## 2013-03-07 DIAGNOSIS — L02411 Cutaneous abscess of right axilla: Secondary | ICD-10-CM

## 2013-03-07 MED ORDER — DOXYCYCLINE HYCLATE 50 MG PO CAPS: 100.0000 mg | ORAL_CAPSULE | Freq: Two times a day (BID) | ORAL | Status: DC

## 2013-03-07 NOTE — Progress Notes (Signed)
Subjective:     Patient ID: Jodi Bates, female   DOB: [birth year], 48 y.o.   MRN: [id number]  HPI The patient comes in today doing well and having healed well from her previous surgery and now with a complaint of right axillary fullness and tenderness.  Review of Systems Right axillary fullness and tenderness at the previous site of axillary abscess.    Objective:   Physical Exam Her perianal area has healed well with no evidence of induration will continue drainage.  The right axilla she had a fluctuant area in which after a Betadine prep and local anesthesia I was able to excise. A culture was sent. Mucopurulent drainage was obtained. We subsequently packed with approximately 6 inches of quarter inch iodoform Nu Gauze.      Assessment:     Healing from previous gluteal and perirectal surgery.  New right axillary hidradenitis with fluctuant abscess. Drainage performed in the office.     Plan:     Oral doxycycline therapy 100 mg by mouth twice a day for 10 days. I will see the patient back in 2 weeks. The packing  Should be removed in 2 days. At that time she can shower and pat the wound dry and covered for healing.  Kathryne Eriksson. Dahlia Bailiff, MD, [place] [phone] [facility]

## 2013-03-10 LAB — WOUND CULTURE
GRAM STAIN: NONE SEEN
Gram Stain: NONE SEEN

## 2013-03-21 ENCOUNTER — Encounter (INDEPENDENT_AMBULATORY_CARE_PROVIDER_SITE_OTHER): Payer: Self-pay | Admitting: General Surgery

## 2013-03-21 ENCOUNTER — Ambulatory Visit (INDEPENDENT_AMBULATORY_CARE_PROVIDER_SITE_OTHER): Payer: Commercial Managed Care - PPO | Admitting: General Surgery

## 2013-03-21 VITALS — BP 116/76 | HR 71 | Temp 97.8°F | Resp 16 | Ht 64.5 in | Wt 206.6 lb

## 2013-03-21 DIAGNOSIS — IMO0002 Reserved for concepts with insufficient information to code with codable children: Secondary | ICD-10-CM

## 2013-03-21 DIAGNOSIS — L02419 Cutaneous abscess of limb, unspecified: Secondary | ICD-10-CM

## 2013-03-21 NOTE — Progress Notes (Signed)
Subjective:     Patient ID: Jodi Bates, female   DOB: 06-Jul-1965, 48 y.o.   MRN: [id number]  HPI The patient's right axillary abscess has resolved although there is still some induration in the area. There is a possibility that she will need a wider excision in the future.  Review of Systems Asymptomatic.    Objective:   Physical Exam Mildly indurated area around previous site of incision and drainage of abscess.    Assessment:     Healing right axillary abscess with the possibility of future hidradenitis.     Plan:     The patient has finished her antibiotics. She can see me on an as-needed basis. Currently no further treatment is necessary.

## 2013-05-09 ENCOUNTER — Telehealth (INDEPENDENT_AMBULATORY_CARE_PROVIDER_SITE_OTHER): Payer: Self-pay

## 2013-05-09 DIAGNOSIS — L732 Hidradenitis suppurativa: Secondary | ICD-10-CM

## 2013-05-09 MED ORDER — DOXYCYCLINE HYCLATE 100 MG PO TABS: 100.0000 mg | ORAL_TABLET | Freq: Two times a day (BID) | ORAL | Status: DC

## 2013-05-09 NOTE — Telephone Encounter (Signed)
Pt calling in to see if she needs an abx called in b/c she is having a recurrent rt axilla abscess that has opened up on it's on today. The pt has seen Dr Hulen Skains for this same problem this past Feb. And everything had healed fine. The pt noticed an area last night in the same place of the right axilla so she started using warm soaks under the arm which got it to drain today. The pt has no other symptoms. The pt just wanted to check to see if she needed an abx. The pt works at [employer] and she can't get off this week for an appt. The pt has a f/u appt with Dr Hulen Skains on 06/06/13. Please advise.

## 2013-05-09 NOTE — Telephone Encounter (Signed)
LMOM notifying pt that we e prescribed Doxycycline 100mg  #20 BID x 10 days to [place] per Dr Zella Richer. I advised pt to keep her appt with Dr Hulen Skains but if any changes for her to call our office.

## 2013-05-09 NOTE — Telephone Encounter (Signed)
From the notes, it appears that she has hidradenitis. Would give her doxycycline 100 mg by mouth twice a day for 10 days. Would make an appointment for her to see Dr. Hulen Skains.

## 2013-06-06 ENCOUNTER — Ambulatory Visit (INDEPENDENT_AMBULATORY_CARE_PROVIDER_SITE_OTHER): Payer: Commercial Managed Care - PPO | Admitting: General Surgery

## 2013-06-13 ENCOUNTER — Encounter (INDEPENDENT_AMBULATORY_CARE_PROVIDER_SITE_OTHER): Payer: Self-pay | Admitting: General Surgery

## 2013-06-13 ENCOUNTER — Ambulatory Visit (INDEPENDENT_AMBULATORY_CARE_PROVIDER_SITE_OTHER): Payer: Commercial Managed Care - PPO | Admitting: General Surgery

## 2013-06-13 VITALS — BP 126/74 | HR 53 | Temp 98.4°F | Ht 64.0 in | Wt 204.0 lb

## 2013-06-13 DIAGNOSIS — L732 Hidradenitis suppurativa: Secondary | ICD-10-CM

## 2013-06-13 MED ORDER — DOXYCYCLINE HYCLATE 100 MG PO TABS: 100.0000 mg | ORAL_TABLET | Freq: Two times a day (BID) | ORAL | Status: DC

## 2013-06-13 NOTE — Progress Notes (Signed)
Subjective:     Patient ID: Jodi Bates, female   DOB: February 04, 1965, 48 y.o.   MRN: [id number]  HPI The patient had a recurrent right axillary abscess which is spontaneously draining. It is still somewhat firm and tender but it is draining.  Review of Systems No fevers or chills.    Objective:   Physical Exam Multiple open areas in her right axilla with some fluctuance underneath but it does spontaneously drained. There was no surrounding erythema.    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     Plan:     Doxycycline 100 mg by mouth twice a day for 2 weeks. I'll see the patient again in 3 weeks.  The patient needs excision of this area for her to prevent her from having this problem again and again. We will discuss this in detail at her next clinic appointment.

## 2013-06-26 ENCOUNTER — Other Ambulatory Visit: Payer: Self-pay

## 2013-06-27 ENCOUNTER — Other Ambulatory Visit: Payer: Self-pay

## 2013-06-27 MED ORDER — CHLORHEXIDINE GLUCONATE 4 % EX LIQD: Freq: Every day | CUTANEOUS | Status: DC | PRN

## 2013-06-27 MED ORDER — MUPIROCIN 2 % EX OINT: 1.0000 "application " | TOPICAL_OINTMENT | Freq: Two times a day (BID) | CUTANEOUS | Status: DC

## 2013-07-03 ENCOUNTER — Telehealth (INDEPENDENT_AMBULATORY_CARE_PROVIDER_SITE_OTHER): Payer: Self-pay

## 2013-07-03 NOTE — Telephone Encounter (Signed)
Called pt back. Axilla area is doing fine. Does not want sx at this time. She will call back if she needs [place].

## 2013-07-03 NOTE — Telephone Encounter (Signed)
Message copied by Carlene Coria on Mon Jul 03, 2013  2:03 PM ------      Message from: Joselyn Arrow      Created: Mon Jul 03, 2013  1:04 PM      Contact: [phone]       PT HAD TO CX APPT 07-04-13.  DOES SHE NEED TO R/S? CALL PT /GM ------

## 2013-07-04 ENCOUNTER — Encounter (INDEPENDENT_AMBULATORY_CARE_PROVIDER_SITE_OTHER): Payer: Commercial Managed Care - PPO | Admitting: General Surgery

## 2013-07-13 ENCOUNTER — Other Ambulatory Visit: Payer: 59 | Admitting: Internal Medicine

## 2013-07-13 DIAGNOSIS — Z131 Encounter for screening for diabetes mellitus: Secondary | ICD-10-CM

## 2013-07-13 LAB — HEMOGLOBIN A1C
Hgb A1c MFr Bld: 5.7 % — ABNORMAL HIGH (ref <5.7)
Mean Plasma Glucose: 117 mg/dL — ABNORMAL HIGH (ref <117)

## 2013-07-14 ENCOUNTER — Ambulatory Visit: Payer: 59 | Admitting: Internal Medicine

## 2013-07-17 ENCOUNTER — Ambulatory Visit (INDEPENDENT_AMBULATORY_CARE_PROVIDER_SITE_OTHER): Payer: 59 | Admitting: Internal Medicine

## 2013-07-17 ENCOUNTER — Encounter: Payer: Self-pay | Admitting: Internal Medicine

## 2013-07-17 VITALS — BP 120/88 | Temp 98.5°F | Wt 204.0 lb

## 2013-07-17 DIAGNOSIS — L732 Hidradenitis suppurativa: Secondary | ICD-10-CM

## 2013-07-17 DIAGNOSIS — I10 Essential (primary) hypertension: Secondary | ICD-10-CM

## 2013-07-17 DIAGNOSIS — R7309 Other abnormal glucose: Secondary | ICD-10-CM

## 2013-07-17 DIAGNOSIS — R7302 Impaired glucose tolerance (oral): Secondary | ICD-10-CM

## 2013-07-17 MED ORDER — LISINOPRIL-HYDROCHLOROTHIAZIDE 20-25 MG PO TABS: 1.0000 | ORAL_TABLET | Freq: Every day | ORAL | Status: DC

## 2013-07-17 MED ORDER — AMLODIPINE BESYLATE 5 MG PO TABS: 5.0000 mg | ORAL_TABLET | Freq: Every day | ORAL | Status: DC

## 2013-07-18 MED ORDER — CLINDAMYCIN PHOSPHATE 1 % EX SOLN: Freq: Two times a day (BID) | CUTANEOUS | Status: DC

## 2013-07-18 NOTE — Patient Instructions (Addendum)
Return in 6 months for physical examination. Try Cleocin T. to right axilla at bedtime. Continue bathing and Hibiclens and using Bactroban ointment in nostrils daily. Continue same antihypertensive medication. Watch diet and exercise.

## 2013-07-22 NOTE — Progress Notes (Signed)
   Subjective:    Patient ID: Jodi Bates, female    DOB: 02-11-1965, 48 y.o.   MRN: [id number]  HPI  Six-month recheck of hypertension and asthma. She's had issues recently with hidradenitis right axilla. I have suggested she use Hibiclens soap. I've also prescribed topical Cleocin to use and axillary areas bilaterally. She has a history of perirectal abscesses  treated by Dr. Hulen Skains. Blood pressure is under excellent control. She has a history of impaired glucose tolerance. She is overweight at 204 pounds but recently began to work out with a Clinical research associate. She weighed 210 pounds in December.    Review of Systems     Objective:   Physical Exam Skin warm and dry. Right axilla without abscess. Nodes none. Chest clear to auscultation without rales or wheezing. Cardiac exam regular rate and rhythm normal S1 and S2. Extremities without edema. No thyromegaly        Assessment & Plan:  Hypertension-stable on current regimen of lisinopril HCTZ and amlodipine  Obesity-has recently begun to work out with try her. Counsel regarding diet exercise and weight loss  History of hidradenitis  History of perirectal abscesses  Impaired glucose tolerance-hemoglobin A1c 5.7% previously was 5.8%.  Metabolic syndrome  History of asthma-uses when necessary albuterol inhaler and Advair 250/50  Plan: Continue to work on diet exercise and weight loss. Return in 6 months for physical examination. Try topical Cleocin to axillary areas daily. Continue bathing with Hibiclens. Continue same antihypertensive regimen with lisinopril HCTZ and amlodipine. Okay to refill albuterol inhaler. Advise using Bactroban ointment in nostrils.

## 2013-09-05 ENCOUNTER — Encounter: Payer: 59 | Attending: Internal Medicine | Admitting: *Deleted

## 2013-09-05 VITALS — Ht 64.5 in | Wt 201.4 lb

## 2013-09-05 DIAGNOSIS — R7302 Impaired glucose tolerance (oral): Secondary | ICD-10-CM

## 2013-09-05 DIAGNOSIS — R7309 Other abnormal glucose: Secondary | ICD-10-CM | POA: Insufficient documentation

## 2013-09-05 DIAGNOSIS — Z713 Dietary counseling and surveillance: Secondary | ICD-10-CM | POA: Insufficient documentation

## 2013-09-06 ENCOUNTER — Encounter: Payer: Self-pay | Admitting: *Deleted

## 2013-09-06 NOTE — Progress Notes (Signed)
  Medical Nutrition Therapy:  Appt start time: 1630 end time:  5400.  Assessment:  Primary concerns today: Pre-diabetes and weight management follow up visit. Weight loss of 6 pounds noted since last visit in December. She is working with Physiological scientist 1 hour a week and doing additional cardio and weight training another 3 hours a week. Just returned from vacation at [facility]. States her last A1c was down to 5.7%! She does not have her own meter, but uses her husband's on occasion. Feels in control of eating and activity habits.  MEDICATIONS: see list.   DIETARY INTAKE:  Usual eating pattern includes 3 meals and 3 snacks per day.  Everyday foods include better variety of all food groups.  Avoided foods include beef or pork.    24-hr recall:  B ( AM): used to skip, now tries to have 2 [place] links occasionally an egg OR fruit smoothie  Snk ( AM): protein bar or cucumbers  L ( PM): when working, often brings a salad OR buys a vegetable plate with a small portion of meat, water after the meal Snk ( PM): same as AM, likes popcorn D ( PM): she is by herself so lean meat, occasionally a starch, OR salad, water Snk ( PM): popcorn or nuts Beverages: water  Usual physical activity: Cardio video workouts for 60 minutes about 3 days a week and sees personal trainer for 1 hour each week also.  Estimated energy needs: 1400 calories 158 g carbohydrates 105 g protein 39 g fat  Progress Towards Goal(s):  In progress.   Nutritional Diagnosis:  NI-1.5 Excessive energy intake As related to activity level.  As evidenced by BMI of 36 now down to 35.1    Intervention:  Commended her on her multiple improved behaviors including more appropriate food choices and increased activity level  Plan:  Continue to aim for 2-3 Carb Choices per meal (30-45 grams) +/- 1 either way  Continue to aim for 0-1 Carbs per snack if hungry  Continue reading food labels for Total Carbohydrate  of foods Continue  with your current activity level of personal trainer for 1 hour and additional cardio and weigh training another 3 hours each week. Great Job!     Handouts given during visit include: no new handouts at this visit  Monitoring/Evaluation:  Dietary intake, exercise, reading food labels, and body weight PRN

## 2013-09-06 NOTE — Patient Instructions (Signed)
Plan:  Continue to aim for 2-3 Carb Choices per meal (30-45 grams) +/- 1 either way  Continue to aim for 0-1 Carbs per snack if hungry  Continue reading food labels for Total Carbohydrate  of foods Continue with your current activity level of personal trainer for 1 hour and additional cardio and weigh training another 3 hours each week. Great Job!

## 2013-12-08 ENCOUNTER — Encounter: Payer: Self-pay | Admitting: Internal Medicine

## 2014-01-11 ENCOUNTER — Other Ambulatory Visit: Payer: 59 | Admitting: Internal Medicine

## 2014-01-11 DIAGNOSIS — I10 Essential (primary) hypertension: Secondary | ICD-10-CM

## 2014-01-11 DIAGNOSIS — Z130 Encounter for screening for diseases of the blood and blood-forming organs and certain disorders involving the immune mechanism: Secondary | ICD-10-CM

## 2014-01-11 DIAGNOSIS — Z0000 Encounter for general adult medical examination without abnormal findings: Secondary | ICD-10-CM

## 2014-01-11 DIAGNOSIS — Z13220 Encounter for screening for lipoid disorders: Secondary | ICD-10-CM

## 2014-01-11 DIAGNOSIS — R7309 Other abnormal glucose: Secondary | ICD-10-CM

## 2014-01-11 DIAGNOSIS — Z1321 Encounter for screening for nutritional disorder: Secondary | ICD-10-CM

## 2014-01-11 DIAGNOSIS — Z1329 Encounter for screening for other suspected endocrine disorder: Secondary | ICD-10-CM

## 2014-01-11 LAB — CBC WITH DIFFERENTIAL/PLATELET
Basophils Absolute: 0 10*3/uL (ref 0.0–0.1)
Basophils Relative: 0 % (ref 0–1)
EOS PCT: 2 % (ref 0–5)
Eosinophils Absolute: 0.1 10*3/uL (ref 0.0–0.7)
HEMATOCRIT: 41 % (ref 36.0–46.0)
Hemoglobin: 13.6 g/dL (ref 12.0–15.0)
Lymphocytes Relative: 46 % (ref 12–46)
Lymphs Abs: 1.5 10*3/uL (ref 0.7–4.0)
MCH: 29.8 pg (ref 26.0–34.0)
MCHC: 33.2 g/dL (ref 30.0–36.0)
MCV: 89.9 fL (ref 78.0–100.0)
MONO ABS: 0.5 10*3/uL (ref 0.1–1.0)
MONOS PCT: 15 % — AB (ref 3–12)
MPV: 10.5 fL (ref 9.4–12.4)
NEUTROS ABS: 1.2 10*3/uL — AB (ref 1.7–7.7)
Neutrophils Relative %: 37 % — ABNORMAL LOW (ref 43–77)
Platelets: 237 10*3/uL (ref 150–400)
RBC: 4.56 MIL/uL (ref 3.87–5.11)
RDW: 13.1 % (ref 11.5–15.5)
WBC: 3.3 10*3/uL — AB (ref 4.0–10.5)

## 2014-01-11 LAB — COMPREHENSIVE METABOLIC PANEL
ALK PHOS: 65 U/L (ref 39–117)
ALT: 13 U/L (ref 0–35)
AST: 25 U/L (ref 0–37)
Albumin: 4.3 g/dL (ref 3.5–5.2)
BUN: 16 mg/dL (ref 6–23)
CO2: 30 mEq/L (ref 19–32)
CREATININE: 0.85 mg/dL (ref 0.50–1.10)
Calcium: 9.5 mg/dL (ref 8.4–10.5)
Chloride: 102 mEq/L (ref 96–112)
Glucose, Bld: 85 mg/dL (ref 70–99)
Potassium: 3.9 mEq/L (ref 3.5–5.3)
Sodium: 141 mEq/L (ref 135–145)
Total Bilirubin: 0.7 mg/dL (ref 0.2–1.2)
Total Protein: 6.7 g/dL (ref 6.0–8.3)

## 2014-01-11 LAB — LIPID PANEL
CHOLESTEROL: 177 mg/dL (ref 0–200)
HDL: 61 mg/dL (ref >39)
LDL CALC: 100 mg/dL — AB (ref 0–99)
TRIGLYCERIDES: 78 mg/dL (ref <150)
Total CHOL/HDL Ratio: 2.9 Ratio
VLDL: 16 mg/dL (ref 0–40)

## 2014-01-11 LAB — HEMOGLOBIN A1C
Hgb A1c MFr Bld: 5.6 % (ref <5.7)
Mean Plasma Glucose: 114 mg/dL (ref <117)

## 2014-01-11 LAB — TSH: TSH: 2.914 u[IU]/mL (ref 0.350–4.500)

## 2014-01-12 ENCOUNTER — Encounter: Payer: Self-pay | Admitting: Internal Medicine

## 2014-01-12 ENCOUNTER — Ambulatory Visit (INDEPENDENT_AMBULATORY_CARE_PROVIDER_SITE_OTHER): Payer: 59 | Admitting: Internal Medicine

## 2014-01-12 ENCOUNTER — Other Ambulatory Visit (HOSPITAL_COMMUNITY)
Admission: RE | Admit: 2014-01-12 | Discharge: 2014-01-12 | Disposition: A | Discharge status: Home / Self Care | Payer: 59 | Source: Ambulatory Visit | Attending: Internal Medicine | Admitting: Internal Medicine

## 2014-01-12 VITALS — BP 130/84 | HR 63 | Temp 97.9°F | Ht 64.0 in | Wt 200.0 lb

## 2014-01-12 DIAGNOSIS — Z8709 Personal history of other diseases of the respiratory system: Secondary | ICD-10-CM

## 2014-01-12 DIAGNOSIS — Z0000 Encounter for general adult medical examination without abnormal findings: Secondary | ICD-10-CM

## 2014-01-12 DIAGNOSIS — L732 Hidradenitis suppurativa: Secondary | ICD-10-CM | POA: Diagnosis not present

## 2014-01-12 DIAGNOSIS — Z01419 Encounter for gynecological examination (general) (routine) without abnormal findings: Secondary | ICD-10-CM | POA: Insufficient documentation

## 2014-01-12 DIAGNOSIS — K611 Rectal abscess: Secondary | ICD-10-CM | POA: Diagnosis not present

## 2014-01-12 DIAGNOSIS — I10 Essential (primary) hypertension: Secondary | ICD-10-CM | POA: Diagnosis not present

## 2014-01-12 LAB — POCT URINALYSIS DIPSTICK
BILIRUBIN UA: NEGATIVE
Blood, UA: NEGATIVE
GLUCOSE UA: NEGATIVE
Ketones, UA: NEGATIVE
LEUKOCYTES UA: NEGATIVE
Nitrite, UA: NEGATIVE
PH UA: 6.5
Protein, UA: NEGATIVE
Spec Grav, UA: 1.015
Urobilinogen, UA: NEGATIVE

## 2014-01-12 LAB — VITAMIN D 25 HYDROXY (VIT D DEFICIENCY, FRACTURES): Vit D, 25-Hydroxy: 46 ng/mL (ref 30–100)

## 2014-01-12 MED ORDER — ALBUTEROL SULFATE HFA 108 (90 BASE) MCG/ACT IN AERS: 2.0000 | INHALATION_SPRAY | Freq: Four times a day (QID) | RESPIRATORY_TRACT | Status: DC | PRN

## 2014-01-12 MED ORDER — LISINOPRIL-HYDROCHLOROTHIAZIDE 20-25 MG PO TABS: 1.0000 | ORAL_TABLET | Freq: Every day | ORAL | Status: DC

## 2014-01-12 MED ORDER — FLUTICASONE-SALMETEROL 250-50 MCG/DOSE IN AEPB: 1.0000 | INHALATION_SPRAY | Freq: Two times a day (BID) | RESPIRATORY_TRACT | Status: DC

## 2014-01-12 MED ORDER — AMLODIPINE BESYLATE 5 MG PO TABS: 5.0000 mg | ORAL_TABLET | Freq: Every day | ORAL | Status: DC

## 2014-01-15 ENCOUNTER — Ambulatory Visit
Admission: RE | Admit: 2014-01-15 | Discharge: 2014-01-15 | Disposition: A | Discharge status: Home / Self Care | Payer: 59 | Source: Ambulatory Visit | Attending: Internal Medicine | Admitting: Internal Medicine

## 2014-01-15 DIAGNOSIS — Z0000 Encounter for general adult medical examination without abnormal findings: Secondary | ICD-10-CM

## 2014-01-16 LAB — CYTOLOGY - PAP

## 2014-01-30 ENCOUNTER — Ambulatory Visit: Payer: 59

## 2014-02-08 ENCOUNTER — Ambulatory Visit (INDEPENDENT_AMBULATORY_CARE_PROVIDER_SITE_OTHER): Payer: 59 | Admitting: Internal Medicine

## 2014-02-08 ENCOUNTER — Encounter: Payer: Self-pay | Admitting: Internal Medicine

## 2014-02-08 VITALS — BP 130/70 | HR 64 | Temp 98.4°F | Wt 206.0 lb

## 2014-02-08 DIAGNOSIS — J069 Acute upper respiratory infection, unspecified: Secondary | ICD-10-CM

## 2014-02-08 DIAGNOSIS — Z8679 Personal history of other diseases of the circulatory system: Secondary | ICD-10-CM

## 2014-02-08 DIAGNOSIS — Z8709 Personal history of other diseases of the respiratory system: Secondary | ICD-10-CM

## 2014-02-08 MED ORDER — LEVOFLOXACIN 500 MG PO TABS: 500.0000 mg | ORAL_TABLET | Freq: Every day | ORAL | Status: DC

## 2014-02-08 MED ORDER — HYDROCODONE-HOMATROPINE 5-1.5 MG/5ML PO SYRP: 5.0000 mL | ORAL_SOLUTION | Freq: Three times a day (TID) | ORAL | Status: DC | PRN

## 2014-02-08 NOTE — Progress Notes (Signed)
   Subjective:    Patient ID: Jodi Bates, female    DOB: 02-15-1965, 49 y.o.   MRN: [id number]  HPI Patient has history of asthma and hypertension. She's come down with a respiratory infection with cough and congestion. Has malaise and fatigue. Think she's been wheezing some. Also has been febrile. No significant shaking chills.    Review of Systems     Objective:   Physical Exam  Skin warm and dry. Nodes none. TMs are slightly full. Pharynx is clear. Neck supple. Chest clear without rales or wheezing.      Assessment & Plan:  Acute URI  History of asthma  History of hypertension  Plan: Levaquin 500 milligrams daily for 10 days. Use albuterol and Advair inhalers as directed. Hycodan 1 teaspoon by mouth every 8 hours when necessary cough. Call if not better in 7-10 days or sooner if worse

## 2014-02-08 NOTE — Patient Instructions (Addendum)
Take Levaquin 500 mg daily with food. Take Hycodan as needed for cough. Use both Advair and albuterol inhalers.
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
## 2014-04-25 NOTE — Patient Instructions (Signed)
Continue same medications and return in 6 months. It was a pleasure to see you today. Your lab work is within normal limits.

## 2014-07-02 ENCOUNTER — Encounter: Payer: Self-pay | Admitting: Internal Medicine

## 2014-07-02 ENCOUNTER — Ambulatory Visit (INDEPENDENT_AMBULATORY_CARE_PROVIDER_SITE_OTHER): Payer: 59 | Admitting: Internal Medicine

## 2014-07-02 VITALS — BP 130/84 | HR 66 | Temp 98.1°F | Wt 211.0 lb

## 2014-07-02 DIAGNOSIS — I10 Essential (primary) hypertension: Secondary | ICD-10-CM | POA: Diagnosis not present

## 2014-07-02 DIAGNOSIS — Z8709 Personal history of other diseases of the respiratory system: Secondary | ICD-10-CM

## 2014-07-02 DIAGNOSIS — J069 Acute upper respiratory infection, unspecified: Secondary | ICD-10-CM

## 2014-07-02 MED ORDER — ALBUTEROL SULFATE (2.5 MG/3ML) 0.083% IN NEBU: 2.5000 mg | INHALATION_SOLUTION | Freq: Four times a day (QID) | RESPIRATORY_TRACT | Status: DC | PRN

## 2014-07-02 MED ORDER — LEVOFLOXACIN 500 MG PO TABS: 500.0000 mg | ORAL_TABLET | Freq: Every day | ORAL | Status: DC

## 2014-07-02 MED ORDER — HYDROCODONE-HOMATROPINE 5-1.5 MG/5ML PO SYRP: 5.0000 mL | ORAL_SOLUTION | Freq: Three times a day (TID) | ORAL | Status: DC | PRN

## 2014-07-03 ENCOUNTER — Encounter: Payer: Self-pay | Admitting: Internal Medicine

## 2014-07-03 NOTE — Progress Notes (Signed)
   Subjective:    Patient ID: Jodi Bates, female    DOB: 08-01-1965, 49 y.o.   MRN: [id number]  HPI  Came down with acute respiratory infection middle of last week. Has had malaise and fatigue. Cough and congestion. Some discolored sputum. Not a lot of wheezing. Does have both inhalers on hand and has refills. History of asthma treated with albuterol and Advair inhalers. History of hypertension. Blood pressure is stable.   Review of Systems see above     Objective:   Physical Exam  Skin warm and dry. Nodes none. Pharynx is clear. TMs are clear. Neck is supple. Chest clear to auscultation without rales or wheezing      Assessment & Plan:  Acute URI  Plan: Patient is not short of breath nor feels that she needs steroids at this point in time. Prescribed Levaquin 500 milligrams daily for 10 days. Hycodan 1 teaspoon by mouth every 8 hours when necessary cough.

## 2014-07-03 NOTE — Patient Instructions (Signed)
Take Levaquin and Hycodan as directed. Call if not better in 7-10 days or sooner if worse.

## 2014-07-04 ENCOUNTER — Telehealth: Payer: Self-pay | Admitting: Internal Medicine

## 2014-07-04 NOTE — Telephone Encounter (Signed)
States she started the Levaquin last night and it stated that it "may" cause drowsiness.  Because she takes something else already that causes drowsiness at night, she cannot take this antibiotic at night as well.  She wants something else as an antibiotic because this makes her too drowsy.  She only took that one does.  She hasn't even tried the hycodan at all because of the drowsiness side effect.  She has used Doxycycline before with success.    Pharmacy:  [facility].

## 2014-07-04 NOTE — Telephone Encounter (Signed)
Left message with instructions on patient voice mail to continue Levaquin patient can break in half and take 1/2 tab BID

## 2014-07-04 NOTE — Telephone Encounter (Signed)
I do think it causes much drowsiness. May take one half tab bid

## 2014-07-12 ENCOUNTER — Other Ambulatory Visit: Payer: 59 | Admitting: Internal Medicine

## 2014-07-12 DIAGNOSIS — R7309 Other abnormal glucose: Secondary | ICD-10-CM

## 2014-07-12 LAB — HEMOGLOBIN A1C
HEMOGLOBIN A1C: 5.5 % (ref <5.7)
Mean Plasma Glucose: 111 mg/dL (ref <117)

## 2014-07-13 ENCOUNTER — Ambulatory Visit: Payer: 59 | Admitting: Internal Medicine

## 2014-07-16 ENCOUNTER — Telehealth: Payer: Self-pay | Admitting: *Deleted

## 2014-07-16 NOTE — Telephone Encounter (Signed)
Left message for patient to call back  

## 2014-07-17 ENCOUNTER — Ambulatory Visit: Payer: 59 | Admitting: Internal Medicine

## 2014-08-01 ENCOUNTER — Other Ambulatory Visit: Payer: Self-pay | Admitting: Internal Medicine

## 2014-09-20 ENCOUNTER — Encounter: Payer: Self-pay | Admitting: *Deleted

## 2014-09-20 DIAGNOSIS — Z006 Encounter for examination for normal comparison and control in clinical research program: Secondary | ICD-10-CM

## 2014-09-20 NOTE — Progress Notes (Signed)
STATUS FIRST Informed Consent      Subject Name: Jodi Bates  Subject met inclusion and exclusion criteria.The informed consent form, study requirements and expectations were reviewed with the subject and questions and concerns were addressed prior to the signing of the consent form. The subject verbalized understanding of the trail requirements. The subject agreed to participate in the STATUS FIRST research study and signed the informed consent at 08:44 on 09-19-14.The informed consent was obtained prior to performance of any protocol-specific procedures for the subject. A copy of the signed informed consent was given to the subject and a copy was placed in the subjects's medical record.    [place] Kevante Lunt 09/19/14 08:44

## 2014-11-27 ENCOUNTER — Telehealth: Payer: Self-pay | Admitting: Internal Medicine

## 2014-11-27 MED ORDER — LISINOPRIL-HYDROCHLOROTHIAZIDE 20-25 MG PO TABS: 1.0000 | ORAL_TABLET | Freq: Every day | ORAL | Status: DC

## 2014-11-27 NOTE — Telephone Encounter (Signed)
Patient calling for refill on her Lisinopril.  Advised that it was denied because she cancelled her 6 mo f/u appointment in June and had not been seen.  She scheduled appointment for Fri, 11/4 @ 4:30.  Advised patient that you would refill for 30 days.  However, if she failed to keep this appointment, you would not provide any more refills until she was seen IN the office.    **Patient would like med to be sent to [facility] today because she's working at [employer] today.  (not her normal pharmacy apparently)  Thanks.

## 2014-11-27 NOTE — Telephone Encounter (Signed)
Prescription sent

## 2014-11-27 NOTE — Telephone Encounter (Signed)
Refill once 

## 2014-11-30 ENCOUNTER — Encounter: Payer: Self-pay | Admitting: Internal Medicine

## 2014-11-30 ENCOUNTER — Ambulatory Visit (INDEPENDENT_AMBULATORY_CARE_PROVIDER_SITE_OTHER): Payer: 59 | Admitting: Internal Medicine

## 2014-11-30 VITALS — BP 130/88 | HR 92 | Temp 97.8°F | Resp 20 | Ht 64.5 in | Wt 219.0 lb

## 2014-11-30 DIAGNOSIS — E669 Obesity, unspecified: Secondary | ICD-10-CM

## 2014-11-30 DIAGNOSIS — Z8709 Personal history of other diseases of the respiratory system: Secondary | ICD-10-CM | POA: Diagnosis not present

## 2014-11-30 DIAGNOSIS — I10 Essential (primary) hypertension: Secondary | ICD-10-CM

## 2014-12-12 ENCOUNTER — Emergency Department (HOSPITAL_COMMUNITY): Payer: 59

## 2014-12-12 ENCOUNTER — Encounter (HOSPITAL_COMMUNITY): Payer: Self-pay | Admitting: Emergency Medicine

## 2014-12-12 ENCOUNTER — Emergency Department (HOSPITAL_COMMUNITY)
Admission: EM | Admit: 2014-12-12 | Discharge: 2014-12-12 | Disposition: A | Discharge status: Home / Self Care | Payer: 59 | Attending: Emergency Medicine | Admitting: Emergency Medicine

## 2014-12-12 DIAGNOSIS — Z862 Personal history of diseases of the blood and blood-forming organs and certain disorders involving the immune mechanism: Secondary | ICD-10-CM | POA: Insufficient documentation

## 2014-12-12 DIAGNOSIS — Z87828 Personal history of other (healed) physical injury and trauma: Secondary | ICD-10-CM | POA: Diagnosis not present

## 2014-12-12 DIAGNOSIS — I10 Essential (primary) hypertension: Secondary | ICD-10-CM | POA: Insufficient documentation

## 2014-12-12 DIAGNOSIS — Z793 Long term (current) use of hormonal contraceptives: Secondary | ICD-10-CM | POA: Diagnosis not present

## 2014-12-12 DIAGNOSIS — Z91040 Latex allergy status: Secondary | ICD-10-CM | POA: Insufficient documentation

## 2014-12-12 DIAGNOSIS — R079 Chest pain, unspecified: Secondary | ICD-10-CM | POA: Insufficient documentation

## 2014-12-12 DIAGNOSIS — J45909 Unspecified asthma, uncomplicated: Secondary | ICD-10-CM | POA: Insufficient documentation

## 2014-12-12 DIAGNOSIS — Z79899 Other long term (current) drug therapy: Secondary | ICD-10-CM | POA: Diagnosis not present

## 2014-12-12 LAB — BASIC METABOLIC PANEL
Anion gap: 12 (ref 5–15)
BUN: 17 mg/dL (ref 6–20)
CALCIUM: 9.6 mg/dL (ref 8.9–10.3)
CHLORIDE: 103 mmol/L (ref 101–111)
CO2: 24 mmol/L (ref 22–32)
CREATININE: 1 mg/dL (ref 0.44–1.00)
GFR calc Af Amer: >60 mL/min (ref >60)
GFR calc non Af Amer: >60 mL/min (ref >60)
Glucose, Bld: 112 mg/dL — ABNORMAL HIGH (ref 65–99)
Potassium: 3.7 mmol/L (ref 3.5–5.1)
Sodium: 139 mmol/L (ref 135–145)

## 2014-12-12 LAB — CBC
HCT: 40.6 % (ref 36.0–46.0)
Hemoglobin: 13.6 g/dL (ref 12.0–15.0)
MCH: 29.8 pg (ref 26.0–34.0)
MCHC: 33.5 g/dL (ref 30.0–36.0)
MCV: 89 fL (ref 78.0–100.0)
Platelets: 184 10*3/uL (ref 150–400)
RBC: 4.56 MIL/uL (ref 3.87–5.11)
RDW: 12.8 % (ref 11.5–15.5)
WBC: 3.7 10*3/uL — ABNORMAL LOW (ref 4.0–10.5)

## 2014-12-12 LAB — I-STAT TROPONIN, ED
Troponin i, poc: 0 ng/mL (ref 0.00–0.08)
Troponin i, poc: 0.01 ng/mL (ref 0.00–0.08)

## 2014-12-12 LAB — D-DIMER, QUANTITATIVE: D-Dimer, Quant: 1.48 ug/mL-FEU — ABNORMAL HIGH (ref 0.00–0.50)

## 2014-12-12 LAB — HCG, QUANTITATIVE, PREGNANCY

## 2014-12-12 MED ORDER — KETOROLAC TROMETHAMINE 30 MG/ML IJ SOLN
30.0000 mg | Freq: Once | INTRAMUSCULAR | Status: AC
  Administered 2014-12-12: 30 mg via INTRAVENOUS
  Filled 2014-12-12: qty 1

## 2014-12-12 MED ORDER — GI COCKTAIL ~~LOC~~
30.0000 mL | Freq: Once | ORAL | Status: AC
  Administered 2014-12-12: 30 mL via ORAL
  Filled 2014-12-12: qty 30

## 2014-12-12 MED ORDER — KETOROLAC TROMETHAMINE 60 MG/2ML IM SOLN
60.0000 mg | Freq: Once | INTRAMUSCULAR | Status: DC
  Filled 2014-12-12: qty 2

## 2014-12-12 MED ORDER — IOHEXOL 350 MG/ML SOLN
100.0000 mL | Freq: Once | INTRAVENOUS | Status: AC | PRN
  Administered 2014-12-12: 100 mL via INTRAVENOUS
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
## 2014-12-12 NOTE — Discharge Instructions (Signed)
To schedule follow-up visit Use your albuterol scheduled every 2-4 hours if this helps to improve your chest pain. Use over-the-counter pain relievers such as Tylenol or Advil for pain relief Return to emergency department with worsening chest pain, shortness of breath or any new or concerning symptoms.   Chest Pain Observation It is often hard to give a specific diagnosis for the cause of chest pain. Among other possibilities your symptoms might be caused by inadequate oxygen delivery to your heart (angina). Angina that is not treated or evaluated can lead to a heart attack (myocardial infarction) or death. Blood tests, electrocardiograms, and X-rays may have been done to help determine a possible cause of your chest pain. After evaluation and observation, your health care provider has determined that it is unlikely your pain was caused by an unstable condition that requires hospitalization. However, a full evaluation of your pain may need to be completed, with additional diagnostic testing as directed. It is very important to keep your follow-up appointments. Not keeping your follow-up appointments could result in permanent heart damage, disability, or death. If there is any problem keeping your follow-up appointments, you must call your health care provider. HOME CARE INSTRUCTIONS  Due to the slight chance that your pain could be angina, it is important to follow your health care provider's treatment plan and also maintain a healthy lifestyle:  Maintain or work toward achieving a healthy weight.  Stay physically active and exercise regularly.  Decrease your salt intake.  Eat a balanced, healthy diet. Talk to a dietitian to learn about heart-healthy foods.  Increase your fiber intake by including whole grains, vegetables, fruits, and nuts in your diet.  Avoid situations that cause stress, anger, or depression.  Take medicines as advised by your health care provider. Report any side effects  to your health care provider. Do not stop medicines or adjust the dosages on your own.  Quit smoking. Do not use nicotine patches or gum until you check with your health care provider.  Keep your blood pressure, blood sugar, and cholesterol levels within normal limits.  Limit alcohol intake to no more than 1 drink per day for women who are not pregnant and 2 drinks per day for men.  Do not abuse drugs. SEEK IMMEDIATE MEDICAL CARE IF: You have severe chest pain or pressure which may include symptoms such as:  You feel pain or pressure in your arms, neck, jaw, or back.  You have severe back or abdominal pain, feel sick to your stomach (nauseous), or throw up (vomit).  You are sweating profusely.  You are having a fast or irregular heartbeat.  You feel short of breath while at rest.  You notice increasing shortness of breath during rest, sleep, or with activity.  You have chest pain that does not get better after rest or after taking your usual medicine.  You wake from sleep with chest pain.  You are unable to sleep because you cannot breathe.  You develop a frequent cough or you are coughing up blood.  You feel dizzy, faint, or experience extreme fatigue.  You develop severe weakness, dizziness, fainting, or chills. Any of these symptoms may represent a serious problem that is an emergency. Do not wait to see if the symptoms will go away. Call your local emergency services (911 in the [place] Do not drive yourself to the hospital. MAKE SURE YOU:  Understand these instructions.  Will watch your condition.  Will get help right away if you are not  doing well or get worse.   This information is not intended to replace advice given to you by your health care provider. Make sure you discuss any questions you have with your health care provider.   Document Released: 02/14/2010 Document Revised: 01/17/2013 Document Reviewed: 07/14/2012 Elsevier Interactive Patient Education [employer]

## 2014-12-12 NOTE — ED Notes (Signed)
Onset CP this AM, central with radiation to back, associated with SOB and mild light headedness, no recent illness or other complaints, NAD

## 2015-01-02 ENCOUNTER — Other Ambulatory Visit: Payer: Self-pay | Admitting: Internal Medicine

## 2015-01-02 ENCOUNTER — Other Ambulatory Visit: Payer: Self-pay

## 2015-01-02 MED ORDER — LISINOPRIL-HYDROCHLOROTHIAZIDE 20-25 MG PO TABS: 1.0000 | ORAL_TABLET | Freq: Every day | ORAL | Status: DC

## 2015-01-02 MED ORDER — AMLODIPINE BESYLATE 5 MG PO TABS: 5.0000 mg | ORAL_TABLET | Freq: Every day | ORAL | Status: DC

## 2015-01-02 NOTE — Telephone Encounter (Signed)
Duplicate encounter.  Disregard. 

## 2015-01-04 ENCOUNTER — Other Ambulatory Visit: Payer: Self-pay

## 2015-01-04 DIAGNOSIS — Z1231 Encounter for screening mammogram for malignant neoplasm of breast: Secondary | ICD-10-CM

## 2015-02-05 ENCOUNTER — Ambulatory Visit
Admission: RE | Admit: 2015-02-05 | Discharge: 2015-02-05 | Disposition: A | Discharge status: Home / Self Care | Payer: 59 | Source: Ambulatory Visit

## 2015-02-05 ENCOUNTER — Ambulatory Visit: Payer: 59

## 2015-02-05 DIAGNOSIS — Z1231 Encounter for screening mammogram for malignant neoplasm of breast: Secondary | ICD-10-CM

## 2015-02-11 DIAGNOSIS — N951 Menopausal and female climacteric states: Secondary | ICD-10-CM | POA: Diagnosis not present

## 2015-02-11 DIAGNOSIS — Z01419 Encounter for gynecological examination (general) (routine) without abnormal findings: Secondary | ICD-10-CM | POA: Diagnosis not present

## 2015-02-11 DIAGNOSIS — Z6837 Body mass index (BMI) 37.0-37.9, adult: Secondary | ICD-10-CM | POA: Diagnosis not present

## 2015-02-11 DIAGNOSIS — N950 Postmenopausal bleeding: Secondary | ICD-10-CM | POA: Diagnosis not present

## 2015-02-11 DIAGNOSIS — N871 Moderate cervical dysplasia: Secondary | ICD-10-CM | POA: Diagnosis not present

## 2015-02-11 MED FILL — PROGESTERONE 100 MG CAPSULE: 100 | 30 days supply | Qty: 30 | Fill #0

## 2015-02-11 MED FILL — MINIVELLE 0.05 MG PATCH: 0.05 | 28 days supply | Qty: 8 | Fill #0

## 2015-02-22 DIAGNOSIS — N841 Polyp of cervix uteri: Secondary | ICD-10-CM | POA: Diagnosis not present

## 2015-02-24 NOTE — Patient Instructions (Signed)
Continue same medications. Encouraged diet exercise and weight loss. Return for physical exam in 6 months.

## 2015-02-24 NOTE — Progress Notes (Signed)
   Subjective:    Patient ID: Jodi Bates, female    DOB: 06/21/1965, 50 y.o.   MRN: [id number]  HPI  50 year old Black Female in today for six-month follow-up. History of essential hypertension and asthma. History of mild impaired glucose tolerance. Work remains stressful.    Review of Systems  No new complaints     Objective:   Physical Exam   Skin warm and dry. Nodes none. Neck is supple without JVD thyromegaly or carotid bruits. Chest clear to auscultation. Cardiac exam regular rate and rhythm normal S1 and S2. Extremity is without edema.      Assessment & Plan:   Essential hypertension-stable on medication  History of asthma -has exacerbations from time to time   Obesity - needs to diet and exercise     History of mild glucose intolerance. However in June, hemoglobin A1c was 5.5%   Plan: Schedule physical exam in 6 months. Continue same medication.   Addendum : Patient seen in emergency department 50/16/2016 with left-sided chest pain. MI ruled out.

## 2015-02-25 DIAGNOSIS — N950 Postmenopausal bleeding: Secondary | ICD-10-CM | POA: Diagnosis not present

## 2015-02-25 DIAGNOSIS — N841 Polyp of cervix uteri: Secondary | ICD-10-CM | POA: Diagnosis not present

## 2015-02-25 DIAGNOSIS — D251 Intramural leiomyoma of uterus: Secondary | ICD-10-CM | POA: Diagnosis not present

## 2015-03-07 ENCOUNTER — Ambulatory Visit (INDEPENDENT_AMBULATORY_CARE_PROVIDER_SITE_OTHER): Payer: 59 | Admitting: Internal Medicine

## 2015-03-07 ENCOUNTER — Encounter: Payer: Self-pay | Admitting: Internal Medicine

## 2015-03-07 VITALS — BP 110/86 | HR 67 | Temp 98.1°F | Resp 20 | Ht 64.0 in | Wt 219.0 lb

## 2015-03-07 DIAGNOSIS — Z8709 Personal history of other diseases of the respiratory system: Secondary | ICD-10-CM | POA: Diagnosis not present

## 2015-03-07 DIAGNOSIS — I10 Essential (primary) hypertension: Secondary | ICD-10-CM | POA: Diagnosis not present

## 2015-03-07 DIAGNOSIS — J069 Acute upper respiratory infection, unspecified: Secondary | ICD-10-CM | POA: Diagnosis not present

## 2015-03-07 MED ORDER — HYDROCODONE-HOMATROPINE 5-1.5 MG/5ML PO SYRP: 5.0000 mL | ORAL_SOLUTION | Freq: Three times a day (TID) | ORAL | Status: DC | PRN

## 2015-03-07 MED ORDER — LEVOFLOXACIN 500 MG PO TABS: 500.0000 mg | ORAL_TABLET | Freq: Every day | ORAL | Status: DC

## 2015-03-07 MED FILL — PROGESTERONE 100 MG CAPSULE: 100 | 30 days supply | Qty: 30 | Fill #1

## 2015-03-07 MED FILL — MINIVELLE 0.05 MG PATCH: 0.05 | 28 days supply | Qty: 8 | Fill #1

## 2015-03-07 MED FILL — HYDROCODONE-HOMATROPINE SYR: 5-1.5 | 8 days supply | Qty: 120 | Fill #0

## 2015-03-07 MED FILL — levoFLOXacin 500 MG TABS: 500 | 10 days supply | Qty: 10 | Fill #0

## 2015-03-07 NOTE — Patient Instructions (Addendum)
Use both inhalers and take Levaquin 500 mg daily x 10 days. Take Hycodan as needed for cough.

## 2015-03-25 ENCOUNTER — Other Ambulatory Visit: Payer: Self-pay | Admitting: Internal Medicine

## 2015-03-25 ENCOUNTER — Other Ambulatory Visit: Payer: 59 | Admitting: Internal Medicine

## 2015-03-25 DIAGNOSIS — I10 Essential (primary) hypertension: Secondary | ICD-10-CM | POA: Diagnosis not present

## 2015-03-25 DIAGNOSIS — R739 Hyperglycemia, unspecified: Secondary | ICD-10-CM | POA: Diagnosis not present

## 2015-03-25 DIAGNOSIS — Z130 Encounter for screening for diseases of the blood and blood-forming organs and certain disorders involving the immune mechanism: Secondary | ICD-10-CM

## 2015-03-25 DIAGNOSIS — Z13220 Encounter for screening for lipoid disorders: Secondary | ICD-10-CM

## 2015-03-25 DIAGNOSIS — Z1321 Encounter for screening for nutritional disorder: Secondary | ICD-10-CM

## 2015-03-25 DIAGNOSIS — Z1329 Encounter for screening for other suspected endocrine disorder: Secondary | ICD-10-CM

## 2015-03-25 DIAGNOSIS — Z0000 Encounter for general adult medical examination without abnormal findings: Secondary | ICD-10-CM

## 2015-03-25 LAB — CBC WITH DIFFERENTIAL/PLATELET
BASOS ABS: 0 10*3/uL (ref 0.0–0.1)
Basophils Relative: 0 % (ref 0–1)
Eosinophils Absolute: 0.1 10*3/uL (ref 0.0–0.7)
Eosinophils Relative: 2 % (ref 0–5)
HEMATOCRIT: 38.8 % (ref 36.0–46.0)
HEMOGLOBIN: 12.7 g/dL (ref 12.0–15.0)
LYMPHS PCT: 39 % (ref 12–46)
Lymphs Abs: 1.2 10*3/uL (ref 0.7–4.0)
MCH: 30.1 pg (ref 26.0–34.0)
MCHC: 32.7 g/dL (ref 30.0–36.0)
MCV: 91.9 fL (ref 78.0–100.0)
MPV: 10.4 fL (ref 8.6–12.4)
Monocytes Absolute: 0.4 10*3/uL (ref 0.1–1.0)
Monocytes Relative: 12 % (ref 3–12)
NEUTROS ABS: 1.4 10*3/uL — AB (ref 1.7–7.7)
Neutrophils Relative %: 47 % (ref 43–77)
Platelets: 262 10*3/uL (ref 150–400)
RBC: 4.22 MIL/uL (ref 3.87–5.11)
RDW: 13.2 % (ref 11.5–15.5)
WBC: 3 10*3/uL — AB (ref 4.0–10.5)

## 2015-03-25 LAB — COMPLETE METABOLIC PANEL WITH GFR
ALBUMIN: 4.1 g/dL (ref 3.6–5.1)
ALT: 9 U/L (ref 6–29)
AST: 16 U/L (ref 10–35)
Alkaline Phosphatase: 53 U/L (ref 33–115)
BUN: 16 mg/dL (ref 7–25)
CALCIUM: 9.3 mg/dL (ref 8.6–10.2)
CHLORIDE: 105 mmol/L (ref 98–110)
CO2: 26 mmol/L (ref 20–31)
CREATININE: 0.87 mg/dL (ref 0.50–1.10)
GFR, Est African American: >89 mL/min (ref >=60)
GFR, Est Non African American: 78 mL/min (ref >=60)
Glucose, Bld: 103 mg/dL — ABNORMAL HIGH (ref 65–99)
Potassium: 4.1 mmol/L (ref 3.5–5.3)
Sodium: 139 mmol/L (ref 135–146)
Total Bilirubin: 0.5 mg/dL (ref 0.2–1.2)
Total Protein: 6.3 g/dL (ref 6.1–8.1)

## 2015-03-25 LAB — LIPID PANEL
CHOLESTEROL: 184 mg/dL (ref 125–200)
HDL: 58 mg/dL (ref >=46)
LDL Cholesterol: 110 mg/dL (ref <130)
Total CHOL/HDL Ratio: 3.2 Ratio (ref <=5.0)
Triglycerides: 81 mg/dL (ref <150)
VLDL: 16 mg/dL (ref <30)

## 2015-03-25 LAB — TSH: TSH: 2.11 mIU/L

## 2015-03-26 LAB — HEMOGLOBIN A1C
Hgb A1c MFr Bld: 5.4 % (ref <5.7)
Mean Plasma Glucose: 108 mg/dL (ref <117)

## 2015-03-26 LAB — VITAMIN D 25 HYDROXY (VIT D DEFICIENCY, FRACTURES): VIT D 25 HYDROXY: 42 ng/mL (ref 30–100)

## 2015-03-26 NOTE — Progress Notes (Signed)
   Subjective:    Patient ID: [name], female    DOB: 01/15/1966, 50 y.o.   MRN: [id number]  HPI Patient in today with URI symptoms. History of asthma and essential hypertension. Has malaise and fatigue. No flulike symptoms. Had flu vaccine through employment. Has cough and congestion.    Review of Systems as above     Objective:   Physical Exam  Skin warm and dry. Nodes none. Neck is supple without adenopathy. Chest clear to auscultation without rales or wheezing. Cardiac exam regular rate and rhythm. Extremities without edema. TMs and pharynx are clear.      Assessment & Plan:  Acute URI  Plan: Use albuterol and Advair inhalers. Take Hycodan as needed for cough. Levaquin 500 milligrams daily for 10 days.

## 2015-03-29 ENCOUNTER — Ambulatory Visit (INDEPENDENT_AMBULATORY_CARE_PROVIDER_SITE_OTHER): Payer: 59 | Admitting: Internal Medicine

## 2015-03-29 ENCOUNTER — Encounter: Payer: Self-pay | Admitting: Internal Medicine

## 2015-03-29 VITALS — BP 134/86 | HR 72 | Temp 98.3°F | Resp 18 | Ht 64.0 in | Wt 216.0 lb

## 2015-03-29 DIAGNOSIS — I10 Essential (primary) hypertension: Secondary | ICD-10-CM

## 2015-03-29 DIAGNOSIS — J069 Acute upper respiratory infection, unspecified: Secondary | ICD-10-CM | POA: Diagnosis not present

## 2015-03-29 DIAGNOSIS — N9089 Other specified noninflammatory disorders of vulva and perineum: Secondary | ICD-10-CM

## 2015-03-29 DIAGNOSIS — F418 Other specified anxiety disorders: Secondary | ICD-10-CM

## 2015-03-29 DIAGNOSIS — Z8709 Personal history of other diseases of the respiratory system: Secondary | ICD-10-CM

## 2015-03-29 DIAGNOSIS — Z0000 Encounter for general adult medical examination without abnormal findings: Secondary | ICD-10-CM

## 2015-03-29 DIAGNOSIS — N907 Vulvar cyst: Secondary | ICD-10-CM

## 2015-03-29 DIAGNOSIS — H8110 Benign paroxysmal vertigo, unspecified ear: Secondary | ICD-10-CM

## 2015-03-29 DIAGNOSIS — F32A Depression, unspecified: Secondary | ICD-10-CM

## 2015-03-29 DIAGNOSIS — F329 Major depressive disorder, single episode, unspecified: Secondary | ICD-10-CM

## 2015-03-29 DIAGNOSIS — F419 Anxiety disorder, unspecified: Secondary | ICD-10-CM

## 2015-03-29 MED ORDER — ALPRAZOLAM 0.25 MG PO TABS: 0.2500 mg | ORAL_TABLET | Freq: Two times a day (BID) | ORAL | Status: DC | PRN

## 2015-03-29 MED ORDER — PREDNISONE 10 MG PO TABS: ORAL_TABLET | ORAL | Status: DC

## 2015-03-29 MED ORDER — LEVOFLOXACIN 500 MG PO TABS: 500.0000 mg | ORAL_TABLET | Freq: Every day | ORAL | Status: DC

## 2015-03-29 MED FILL — predniSONE 10 MG (21) TBPK: 10 | 6 days supply | Qty: 21 | Fill #0

## 2015-03-29 MED FILL — levoFLOXacin 500 MG TABS: 500 | 10 days supply | Qty: 10 | Fill #0

## 2015-03-29 MED FILL — ALPRAZolam 0.25 MG TABS: 0.25 | 15 days supply | Qty: 30 | Fill #0

## 2015-03-29 NOTE — Patient Instructions (Addendum)
Takes Xanax sparingly twice daily for vertigo. Take Levaquin 500 milligrams daily for 10 days. Sterapred DS 10 mg 6 day Dosepak. Use inhalers for wheezing. Continue antihypertensive medication. Return in 6 months.

## 2015-04-08 ENCOUNTER — Other Ambulatory Visit: Payer: Self-pay | Admitting: Internal Medicine

## 2015-04-08 MED FILL — MINIVELLE 0.05 MG PATCH: 0.05 | 28 days supply | Qty: 8 | Fill #2 | Status: TO

## 2015-04-08 MED FILL — ADVAIR 250/50 DISKUS: 250-50 | 30 days supply | Qty: 60 | Fill #0

## 2015-04-08 MED FILL — VENTOLIN HFA 90 MCG INHALER: 108 (90 BAS | 25 days supply | Qty: 18 | Fill #0

## 2015-04-08 MED FILL — LISINOPRIL-HCTZ 20-25 MG TA: 20-25 | 90 days supply | Qty: 90 | Fill #1

## 2015-04-08 MED FILL — PROGESTERONE 100 MG CAPSULE: 100 | 30 days supply | Qty: 30 | Fill #8 | Status: TO

## 2015-04-08 MED FILL — AMLODIPINE BESYLATE 5 MG TA: 5 | 90 days supply | Qty: 90 | Fill #0

## 2015-04-15 ENCOUNTER — Telehealth: Payer: Self-pay | Admitting: Internal Medicine

## 2015-04-15 MED ORDER — BENZONATATE 100 MG PO CAPS: 100.0000 mg | ORAL_CAPSULE | Freq: Three times a day (TID) | ORAL | Status: DC

## 2015-04-15 MED ORDER — PREDNISONE 10 MG (21) PO TBPK: 10.0000 mg | ORAL_TABLET | Freq: Every day | ORAL | Status: DC

## 2015-04-15 MED FILL — predniSONE 10 MG TABS: 10 | 6 days supply | Qty: 21 | Fill #0

## 2015-04-15 MED FILL — BENZONATATE 100 MG CAPSULE: 100 | 10 days supply | Qty: 30 | Fill #0

## 2015-04-15 NOTE — Telephone Encounter (Signed)
Patient notified. Medication sent to pharmacy. 

## 2015-04-15 NOTE — Telephone Encounter (Signed)
Finished her antibiotics from the URI.  Saturday night she started coughing really bad.  She did a nebulizer treatment.  States that she cannot take the Hycodan cough syrup and work and function.  Feels like she is setting in with bronchitis.  She states that she actually "hurts" from coughing.  Wants to know what you feel she should take for the coughing during the day.  She has the Ventolin and Advair inhalers on hand.    She would be available to be seen late this afternoon if you want to see her in the office today.  Please advise.

## 2015-04-15 NOTE — Telephone Encounter (Signed)
Call in Sterapred DS 6 day dosepack and Tessalon perles 200 mg 3 times daily as needed for cough

## 2015-04-19 MED FILL — ESCITALOPRAM 10 MG TABLET: 10 | 30 days supply | Qty: 60 | Fill #0
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
## 2015-04-25 DIAGNOSIS — Q828 Other specified congenital malformations of skin: Secondary | ICD-10-CM | POA: Diagnosis not present

## 2015-04-25 DIAGNOSIS — N9089 Other specified noninflammatory disorders of vulva and perineum: Secondary | ICD-10-CM | POA: Diagnosis not present

## 2015-05-01 ENCOUNTER — Encounter: Payer: Self-pay | Admitting: Allergy and Immunology

## 2015-05-01 ENCOUNTER — Ambulatory Visit (INDEPENDENT_AMBULATORY_CARE_PROVIDER_SITE_OTHER): Payer: 59 | Admitting: Allergy and Immunology

## 2015-05-01 VITALS — BP 122/84 | HR 64 | Temp 97.7°F | Resp 16 | Ht 64.57 in | Wt 214.9 lb

## 2015-05-01 DIAGNOSIS — H1010 Acute atopic conjunctivitis, unspecified eye: Secondary | ICD-10-CM

## 2015-05-01 DIAGNOSIS — K219 Gastro-esophageal reflux disease without esophagitis: Secondary | ICD-10-CM

## 2015-05-01 DIAGNOSIS — J309 Allergic rhinitis, unspecified: Secondary | ICD-10-CM | POA: Diagnosis not present

## 2015-05-01 DIAGNOSIS — J387 Other diseases of larynx: Secondary | ICD-10-CM

## 2015-05-01 DIAGNOSIS — J4541 Moderate persistent asthma with (acute) exacerbation: Secondary | ICD-10-CM | POA: Diagnosis not present

## 2015-05-01 MED ORDER — FLUTICASONE FUROATE-VILANTEROL 200-25 MCG/INH IN AEPB: 1.0000 | INHALATION_SPRAY | Freq: Every day | RESPIRATORY_TRACT | Status: DC

## 2015-05-01 MED ORDER — HYDROCHLOROTHIAZIDE 25 MG PO TABS: ORAL_TABLET | ORAL | Status: DC

## 2015-05-01 MED ORDER — LOSARTAN POTASSIUM 50 MG PO TABS: ORAL_TABLET | ORAL | Status: DC

## 2015-05-01 MED ORDER — OMEPRAZOLE 40 MG PO CPDR: DELAYED_RELEASE_CAPSULE | ORAL | Status: DC

## 2015-05-01 MED ORDER — MONTELUKAST SODIUM 10 MG PO TABS: ORAL_TABLET | ORAL | Status: DC

## 2015-05-01 MED ORDER — RANITIDINE HCL 300 MG PO TABS: ORAL_TABLET | ORAL | Status: DC

## 2015-05-01 MED FILL — LOSARTAN POTASSIUM 50 MG TA: 50 | 30 days supply | Qty: 30 | Fill #0

## 2015-05-01 MED FILL — HYDROCHLOROTHIAZIDE 25 MG T: 25 | 30 days supply | Qty: 30 | Fill #0

## 2015-05-01 MED FILL — raNITIdine HCL 300 MG TABS: 300 | 30 days supply | Qty: 30 | Fill #0

## 2015-05-01 MED FILL — MONTELUKAST SOD 10 MG TAB: 10 | 30 days supply | Qty: 30 | Fill #0

## 2015-05-01 MED FILL — OMEPRAZOLE DR 40 MG CAPSULE: 40 | 30 days supply | Qty: 30 | Fill #0

## 2015-05-01 NOTE — Patient Instructions (Addendum)
  1. Allergen avoidance measures  2. Treat and prevent inflammation:   A. OTC Rhinocort one spray each nostril one time per day  B. Breo 200 one inhalation 1 time per day  C. montelukast 10 mg one tablet one time per day  3. Treat and prevent reflux:   A. eliminate all caffeine and chocolate consumption slowly  B. start omeprazole 40 mg in morning  C. start ranitidine 300 mg in evening  4. Change lisinopril 20/25 to:   A. losartan 50 one tablet one time per day  B. hydrochlorothiazide 25 mg one tablet one time per day  C. check blood pressure  5. If needed:   A. Ventolin HFA 2 puffs every 4-6 hours  B. OTC antihistamine - Claritin/Allegra/Zyrtec  6. Return to clinic in 4 weeks or earlier if problem

## 2015-05-01 NOTE — Progress Notes (Signed)
Dear Dr. Renold Genta,  Thank you for referring Jodi Bates to the [facility] on 05/01/2015.   Below is a summation of this patient's evaluation and recommendations.  Thank you for your referral. I will keep you informed about this patient's response to treatment.   If you have any questions please to do hestitate to contact me.   Sincerely,  Jiles Prows, MD [place] [facility]   ______________________________________________________________________    NEW PATIENT NOTE  Referring Provider: Elby Showers, MD Primary Provider: Elby Showers, MD Date of office visit: 05/01/2015    Subjective:   Chief Complaint:  Jodi Bates (DOB: 07-25-1965) is a 50 y.o. female with a chief complaint of Allergies and Asthma  who presents to the clinic on 05/01/2015 with the following problems:  HPI Comments: Jodi Bates presents this clinic in evaluation of respiratory tract symptoms of many years duration.  For at least 12 years she's been diagnosed with asthma. Her asthma is manifested as coughing and some shortness of breath that appears to be precipitated by exposure to perfumes, strong scents, pets, grasses, and cold air. Interestingly she does have spells of cough associated with gagging and retching and occasional vomiting. She does believe that her short acting bronchodilator does help the symptoms after about 20 minutes of use. She had a flare November requiring her to go to the emergency room and she had a flare this March that was treated with systemic steroids. All her symptoms of occurred while intermittently using Advair.  Jodi Bates also has an issue with nasal congestion and sneezing and itchy Jodi Bates eyes that are sometimes red occurring on a perennial basis with springtime exacerbation especially following exposure to grass and pets. She does not have a history of recurrent sinusitis or ugly nasal  discharge or recurrent fever.  She does have constant throat clearing. She feels as other something stuck in her throat that she can't clear out. This appears to get much worse when she is developing her asthma. In fact, and a lot of cases her asthma is manifested as inability to speak and raspy voice.  She has burning and belching up into her throat. This is been a problem in the past and she's been on Prilosec but she does not use this agent at this point in time. She drinks 1 tea per day and has chocolate twice a week and does not drink any alcohol.  Jodi Bates also relates a history of developing problems with food consumption. When eating a protein bar and one occasion, drinking a soft drink on another occasion, and eating a salmon which on another occasion she's developed a coughing episode associated with throat clearing and no others associated systemic or constitutional symptoms. She did apparently does not have a common issue with choking or gagging when eating in general.   Past Medical History  Diagnosis Date  . Headache(784.0)     tension  . Iron deficiency anemia     no current problems or meds.  . 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 Hypertension     under control with meds., has been on med. > 5 yr.  . Asthma     prn inhalers    Past Surgical History  Procedure Laterality Date  . Tubal ligation    . Breast surgery       lt breast/ benign cyst  .  Laparoscopic endometriosis fulguration    . Hysteroscopy w/d&c  06/25/2004  . Cyst excision      back of head  . Polypectomy      uterine  . Pilonidal cyst excision N/A 04/28/2012    Procedure: exam under anesthesia and exicision of pilonidal;  Surgeon: Gwenyth Ober, MD;  Location: [facility];  Service: General;  Laterality: N/A;  . Incision and drainage perirectal abscess N/A 02/10/2013    Procedure: IRRIGATION AND DEBRIDEMENT PERIRECTAL ABSCESS;  Surgeon: Gwenyth Ober, MD;   Location: Home;  Service: General;  Laterality: N/A;      Medication List           ADVAIR DISKUS 250-50 MCG/DOSE Aepb  Generic drug:  Fluticasone-Salmeterol  INHALE 1 PUFF INTO THE LUNGS TWICE DAILY     albuterol (2.5 MG/3ML) 0.083% nebulizer solution  Commonly known as:  PROVENTIL  Take 3 mLs by nebulization every 6 (six) hours as needed. wheezing     VENTOLIN HFA 108 (90 Base) MCG/ACT inhaler  Generic drug:  albuterol  INHALE 2 PUFFS INTO THE LUNGS EVERY 6 HOURS AS NEEDED FOR WHEEZING OR SHORTNESS OF BREATH.     ALPRAZolam 0.25 MG tablet  Commonly known as:  XANAX  Take 1 tablet (0.25 mg total) by mouth 2 (two) times daily as needed for anxiety.     amLODipine 5 MG tablet  Commonly known as:  NORVASC  Take 1 tablet (5 mg total) by mouth daily.     benzonatate 100 MG capsule  Commonly known as:  TESSALON PERLES  Take 1 capsule (100 mg total) by mouth 3 (three) times daily.     cholecalciferol 1000 units tablet  Commonly known as:  VITAMIN D  Take 1,000 Units by mouth daily.     escitalopram 10 MG tablet  Commonly known as:  LEXAPRO     fexofenadine 60 MG tablet  Commonly known as:  ALLEGRA  Take 60 mg by mouth 2 (two) times daily as needed for allergies.     HAIR VITAMINS PO  Take by mouth.     levofloxacin 500 MG tablet  Commonly known as:  LEVAQUIN  Take 1 tablet (500 mg total) by mouth daily.     lisinopril-hydrochlorothiazide 20-25 MG tablet  Commonly known as:  PRINZIDE,ZESTORETIC  TAKE 1 TABLET BY MOUTH DAILY.     magnesium oxide 400 MG tablet  Commonly known as:  MAG-OX  Take 400 mg by mouth 2 (two) times daily.     MINIVELLE 0.05 MG/24HR patch  Generic drug:  estradiol  Apply 0.05 mg topically 2 (two) times a week.     predniSONE 10 MG tablet  Commonly known as:  DELTASONE  Begin with 60 mg daily 1 and decrease by 10 mg daily in tapering course over 6 days     progesterone 100 MG capsule  Commonly known as:  PROMETRIUM  Take 100 mg by  mouth daily.        Allergies  Allergen Reactions  . Adhesive [Tape] Itching and Other (See Comments)    BLISTERS  . Latex Itching, Other (See Comments) and Cough    SKIN IRRITATION  . Soap Other (See Comments)    TRIGGERS ASTHMA ATTACK - FRAGRANT SOAPS AND LOTIONS    Review of systems negative except as noted in HPI / PMHx or noted below:  Review of Systems  Constitutional: Negative.   HENT: Negative.   Eyes: Negative.   Respiratory: Negative.  Cardiovascular: Negative.   Gastrointestinal: Negative.   Genitourinary: Negative.   Musculoskeletal: Negative.   Skin: Negative.   Neurological: Negative.   Endo/Heme/Allergies: Negative.   Psychiatric/Behavioral: Negative.     Family History  Problem Relation Age of Onset  . Diabetes Mother   . Hypertension Mother   . Allergic rhinitis Mother   . Cancer Father     deceased metastaic throat cancer  . Diabetes Brother   . Asthma Brother   . Cancer Maternal Grandmother     breast  . Allergic rhinitis Son   . Asthma Son     Social History   Social History  . Marital Status: Married    Spouse Name: N/A  . Number of Children: N/A  . Years of Education: N/A   Occupational History  . Not on file.   Social History Main Topics  . Smoking status: Never Smoker   . Smokeless tobacco: Never Used  . Alcohol Use: No  . Drug Use: No  . Sexual Activity:    Partners: Male    Birth Control/ Protection: None   Other Topics Concern  . Not on file   Social History Narrative    Environmental and Social history  Lives in a house with a dry environment, a carpet in the bedroom, no plastic on the bed or pillow, no animals located inside the household, no smoking ongoing with inside the household, and employment as a cardiovascular sonographer   Objective:   Filed Vitals:   05/01/15 0912  BP: 122/84  Pulse: 64  Temp: 97.7 F (36.5 C)  Resp: 16   Height: 5' 4.57" (164 cm) Weight: 214 lb 15.2 oz (97.5  kg)  Physical Exam  Constitutional: She is well-developed, well-nourished, and in no distress.  Constant throat clearing and raspy voice and occasional cough  HENT:  Head: Normocephalic. Head is without right periorbital erythema and without left periorbital erythema.  Right Ear: Tympanic membrane, external ear and ear canal normal.  Left Ear: Tympanic membrane, external ear and ear canal normal.  Nose: Mucosal edema present. No rhinorrhea.  Mouth/Throat: Oropharynx is clear and moist and mucous membranes are normal. No oropharyngeal exudate.  Eyes: Conjunctivae and lids are normal. Pupils are equal, round, and reactive to light.  Neck: Trachea normal. No tracheal deviation present. No thyromegaly present.  Cardiovascular: Normal rate, regular rhythm, S1 normal, S2 normal and normal heart sounds.   No murmur heard. Pulmonary/Chest: Effort normal. No stridor. No tachypnea. No respiratory distress. She has no wheezes. She has no rales. She exhibits no tenderness.  Abdominal: Soft. She exhibits no distension and no mass. There is no hepatosplenomegaly. There is no tenderness. There is no rebound and no guarding.  Musculoskeletal: She exhibits no edema or tenderness.  Lymphadenopathy:       Head (right side): No tonsillar adenopathy present.       Head (left side): No tonsillar adenopathy present.    She has no cervical adenopathy.    She has no axillary adenopathy.  Neurological: She is alert. Gait normal.  Skin: No rash noted. She is not diaphoretic. No erythema. No pallor. Nails show no clubbing.  Psychiatric: Mood and affect normal.     Diagnostics: Allergy skin tests were performed. She demonstrated hypersensitivity against house dust mite and cat and mold  Spirometry was performed and demonstrated an FEV1 of 2.0 @ 87 % of predicted. Following the administration of nebulized albuterol her FEV1 rose to 2.14 which was an increase in the  FEV1 of 7%  The patient had an Asthma Control  Test with the following results: ACT Total Score: 18.     Assessment and Plan:    1. Asthma, not well controlled, moderate persistent, with acute exacerbation   2. Allergic rhinoconjunctivitis   3. LPRD (laryngopharyngeal reflux disease)     1. Allergen avoidance measures  2. Treat and prevent inflammation:   A. OTC Rhinocort one spray each nostril one time per day  B. Breo 200 one inhalation 1 time per day  C. montelukast 10 mg one tablet one time per day  3. Treat and prevent reflux:   A. eliminate all caffeine and chocolate consumption slowly  B. start omeprazole 40 mg in morning  C. start ranitidine 300 mg in evening  4. Change lisinopril 20/25 to:   A. losartan 50 one tablet one time per day  B. hydrochlorothiazide 25 mg one tablet one time per day  C. check blood pressure  5. If needed:   A. Ventolin HFA 2 puffs every 4-6 hours  B. OTC antihistamine - Claritin/Allegra/Zyrtec  6. Return to clinic in 4 weeks or earlier if problem  It appears that Jodi Bates has several different insults on her respiratory tract including atopic disease, reflux-induced respiratory disease, and the use of an ACE inhibitor. We will address all these issues as noted above and regroup with her in approximately 4 weeks or earlier if there is a problem. She will contact me should there be any significant issue that develops as we move forward during the interval.  Jiles Prows, MD [facility]

## 2015-05-07 MED FILL — PROGESTERONE 100 MG CAPSULE: 100 | 30 days supply | Qty: 30 | Fill #0 | Status: TO

## 2015-05-07 MED FILL — MINIVELLE 0.05 MG PATCH: 0.05 | 28 days supply | Qty: 8 | Fill #0 | Status: TO

## 2015-05-08 MED FILL — BREO ELLIPTA 200-25 MCG INH: 200-25 | 30 days supply | Qty: 60 | Fill #0

## 2015-05-21 ENCOUNTER — Other Ambulatory Visit: Payer: Self-pay | Admitting: Internal Medicine

## 2015-05-21 MED FILL — ALPRAZolam 0.25 MG TABS: 0.25 | 30 days supply | Qty: 60 | Fill #0

## 2015-05-21 NOTE — Telephone Encounter (Signed)
Refill x 90 days 

## 2015-05-28 MED FILL — raNITIdine HCL 300 MG TABS: 300 | 30 days supply | Qty: 30 | Fill #1

## 2015-05-28 MED FILL — LOSARTAN POTASSIUM 50 MG TA: 50 | 30 days supply | Qty: 30 | Fill #1

## 2015-05-28 MED FILL — MINIVELLE 0.05 MG PATCH: 0.05 | 28 days supply | Qty: 8 | Fill #0

## 2015-05-28 MED FILL — OMEPRAZOLE DR 40 MG CAPSULE: 40 | 30 days supply | Qty: 30 | Fill #1

## 2015-05-28 MED FILL — PROGESTERONE 100 MG CAPSULE: 100 | 30 days supply | Qty: 30 | Fill #0

## 2015-05-28 MED FILL — HYDROCHLOROTHIAZIDE 25 MG T: 25 | 30 days supply | Qty: 30 | Fill #1

## 2015-05-28 MED FILL — MONTELUKAST SOD 10 MG TAB: 10 | 30 days supply | Qty: 30 | Fill #1

## 2015-05-28 MED FILL — ESCITALOPRAM 10 MG TABLET: 10 | 30 days supply | Qty: 60 | Fill #1

## 2015-06-04 ENCOUNTER — Encounter: Payer: Self-pay | Admitting: Allergy and Immunology

## 2015-06-04 ENCOUNTER — Ambulatory Visit (INDEPENDENT_AMBULATORY_CARE_PROVIDER_SITE_OTHER): Payer: 59 | Admitting: Allergy and Immunology

## 2015-06-04 VITALS — BP 138/84 | HR 80 | Resp 20

## 2015-06-04 DIAGNOSIS — J387 Other diseases of larynx: Secondary | ICD-10-CM

## 2015-06-04 DIAGNOSIS — K219 Gastro-esophageal reflux disease without esophagitis: Secondary | ICD-10-CM

## 2015-06-04 DIAGNOSIS — J4530 Mild persistent asthma, uncomplicated: Secondary | ICD-10-CM

## 2015-06-04 DIAGNOSIS — H1010 Acute atopic conjunctivitis, unspecified eye: Secondary | ICD-10-CM

## 2015-06-04 DIAGNOSIS — J309 Allergic rhinitis, unspecified: Secondary | ICD-10-CM

## 2015-06-04 NOTE — Patient Instructions (Addendum)
  1. Continue to perform Allergen avoidance measures  2. Continue to Treat and prevent inflammation:   A. OTC Rhinocort one spray each nostril one time per day  B. Breo 200 one inhalation 1 time per day  C. montelukast 10 mg one tablet one time per day  3. Continue to Treat and prevent reflux:   A. continue to eliminate all caffeine and chocolate consumption    B. continue omeprazole 40 mg in morning  C. continue ranitidine 300 mg in evening  5. If needed:   A. Ventolin HFA 2 puffs every 4-6 hours  B. OTC antihistamine - Claritin/Allegra/Zyrtec  6. Return to clinic in 8 weeks or earlier if problem

## 2015-06-04 NOTE — Progress Notes (Signed)
Follow-up Note  Referring Provider: Elby Showers, MD Primary Provider: Elby Showers, MD Date of Office Visit: 06/04/2015  Subjective:   Jodi Bates (DOB: 12/14/1965) is a 50 y.o. female who returns to the [facility] on 06/04/2015 in re-evaluation of the following:  HPI: Zyanya returns to this clinic in reevaluation of her asthma, allergic rhinoconjunctivitis, and LPR.  While utilizing medical therapy established 05/01/2015 she's had complete elimination of cough, shortness of breath, the need to use a short acting bronchodilator, elimination of nasal symptoms, elimination of all throat clearing and drainage, and no classic reflux symptoms. She is eliminated all caffeine use and has performed house dust avoidance measures.    Medication List           albuterol (2.5 MG/3ML) 0.083% nebulizer solution  Commonly known as:  PROVENTIL  Take 3 mLs by nebulization every 6 (six) hours as needed. wheezing     VENTOLIN HFA 108 (90 Base) MCG/ACT inhaler  Generic drug:  albuterol  INHALE 2 PUFFS INTO THE LUNGS EVERY 6 HOURS AS NEEDED FOR WHEEZING OR SHORTNESS OF BREATH.     ALPRAZolam 0.25 MG tablet  Commonly known as:  XANAX  TAKE 1 TABLET BY MOUTH TWICE DAILY AS NEEDED FOR ANXIETY     amLODipine 5 MG tablet  Commonly known as:  NORVASC  Take 1 tablet (5 mg total) by mouth daily.     benzonatate 100 MG capsule  Commonly known as:  TESSALON PERLES  Take 1 capsule (100 mg total) by mouth 3 (three) times daily.     cholecalciferol 1000 units tablet  Commonly known as:  VITAMIN D  Take 1,000 Units by mouth daily.     escitalopram 10 MG tablet  Commonly known as:  LEXAPRO     fexofenadine 60 MG tablet  Commonly known as:  ALLEGRA  Take 60 mg by mouth 2 (two) times daily as needed for allergies.     fluticasone furoate-vilanterol 200-25 MCG/INH Aepb  Commonly known as:  BREO ELLIPTA  Inhale 1 puff into the lungs daily.     HAIR VITAMINS PO  Take by  mouth.     hydrochlorothiazide 25 MG tablet  Commonly known as:  HYDRODIURIL  TAKE ONE TABLET ONCE DAILY     losartan 50 MG tablet  Commonly known as:  COZAAR  TAKE ONE TABLET ONCE DAILY     magnesium oxide 400 MG tablet  Commonly known as:  MAG-OX  Take 400 mg by mouth 2 (two) times daily.     MINIVELLE 0.05 MG/24HR patch  Generic drug:  estradiol  Apply 0.05 mg topically 2 (two) times a week.     montelukast 10 MG tablet  Commonly known as:  SINGULAIR  Take one tablet once daily     omeprazole 40 MG capsule  Commonly known as:  PRILOSEC  TAKE ONE CAPSULE EACH MORNING BEFORE BREAKFAST     progesterone 100 MG capsule  Commonly known as:  PROMETRIUM  Take 100 mg by mouth daily.     ranitidine 300 MG tablet  Commonly known as:  ZANTAC  TAKE ONE TABLET EACH EVENING        Past Medical History  Diagnosis Date  . Headache(784.0)     tension  . Iron deficiency anemia     no current problems or meds.  . 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 Hypertension     under control  with meds., has been on med. > 5 yr.  . Asthma     prn inhalers    Past Surgical History  Procedure Laterality Date  . Tubal ligation    . Breast surgery       lt breast/ benign cyst  . Laparoscopic endometriosis fulguration    . Hysteroscopy w/d&c  06/25/2004  . Cyst excision      back of head  . Polypectomy      uterine  . Pilonidal cyst excision N/A 04/28/2012    Procedure: exam under anesthesia and exicision of pilonidal;  Surgeon: Gwenyth Ober, MD;  Location: [facility];  Service: General;  Laterality: N/A;  . Incision and drainage perirectal abscess N/A 02/10/2013    Procedure: IRRIGATION AND DEBRIDEMENT PERIRECTAL ABSCESS;  Surgeon: Gwenyth Ober, MD;  Location: [facility];  Service: General;  Laterality: N/A;    Allergies  Allergen Reactions  . Adhesive [Tape] Itching and Other (See Comments)    BLISTERS  . Latex Itching, Other (See  Comments) and Cough    SKIN IRRITATION  . Soap Other (See Comments)    TRIGGERS ASTHMA ATTACK - FRAGRANT SOAPS AND LOTIONS    Review of systems negative except as noted in HPI / PMHx or noted below:  Review of Systems  Constitutional: Negative.   HENT: Negative.   Eyes: Negative.   Respiratory: Negative.   Cardiovascular: Negative.   Gastrointestinal: Negative.   Genitourinary: Negative.   Musculoskeletal: Negative.   Skin: Negative.   Neurological: Negative.   Endo/Heme/Allergies: Negative.   Psychiatric/Behavioral: Negative.      Objective:   Filed Vitals:   06/04/15 1529  BP: 138/84  Pulse: 80  Resp: 20          Physical Exam  Constitutional: She is well-developed, well-nourished, and in no distress.  HENT:  Head: Normocephalic.  Right Ear: Tympanic membrane, external ear and ear canal normal.  Left Ear: Tympanic membrane, external ear and ear canal normal.  Nose: Nose normal. No mucosal edema or rhinorrhea.  Mouth/Throat: Uvula is midline, oropharynx is clear and moist and mucous membranes are normal. No oropharyngeal exudate.  Eyes: Conjunctivae are normal.  Neck: Trachea normal. No tracheal tenderness present. No tracheal deviation present. No thyromegaly present.  Cardiovascular: Normal rate, regular rhythm, S1 normal, S2 normal and normal heart sounds.   No murmur heard. Pulmonary/Chest: Breath sounds normal. No stridor. No respiratory distress. She has no wheezes. She has no rales.  Musculoskeletal: She exhibits no edema.  Lymphadenopathy:       Head (right side): No tonsillar adenopathy present.       Head (left side): No tonsillar adenopathy present.    She has no cervical adenopathy.  Neurological: She is alert. Gait normal.  Skin: No rash noted. She is not diaphoretic. No erythema. Nails show no clubbing.  Psychiatric: Mood and affect normal.    Diagnostics:    Spirometry was performed and demonstrated an FEV1 of 2.02 at 88 % of  predicted.  The patient had an Asthma Control Test with the following results: ACT Total Score: 24.    Assessment and Plan:   1. Asthma, well controlled, mild persistent   2. Allergic rhinoconjunctivitis   3. LPRD (laryngopharyngeal reflux disease)     1. Continue to perform Allergen avoidance measures  2. Continue to Treat and prevent inflammation:   A. OTC Rhinocort one spray each nostril one time per day  B. Breo 200 one inhalation 1 time  per day  C. montelukast 10 mg one tablet one time per day  3. Continue to Treat and prevent reflux:   A. Continue to remain away from all caffeine and chocolate consumption   B. continue omeprazole 40 mg in morning  C. continue ranitidine 300 mg in evening  5. If needed:   A. Ventolin HFA 2 puffs every 4-6 hours  B. OTC antihistamine - Claritin/Allegra/Zyrtec  6. Return to clinic in 8 weeks or earlier if problem  Keani has had an excellent response to initial medical therapy and we'll keep her on this plan for a full 12 weeks thus I will see her back in this clinic in 8 weeks. If she continues to do well I will attempt to taper down medications at that point. She'll contact me during the interval should there be significant problem.  Allena Katz, MD [place]

## 2015-06-10 MED FILL — BREO ELLIPTA 200-25 MCG INH: 200-25 | 30 days supply | Qty: 60 | Fill #1

## 2015-06-20 DIAGNOSIS — F419 Anxiety disorder, unspecified: Secondary | ICD-10-CM | POA: Diagnosis not present

## 2015-06-20 MED FILL — ALPRAZolam 0.25 MG TABS: 0.25 | 30 days supply | Qty: 60 | Fill #1

## 2015-06-20 MED FILL — ESCITALOPRAM 20 MG TABLET: 20 | 30 days supply | Qty: 30 | Fill #0

## 2015-06-25 ENCOUNTER — Other Ambulatory Visit: Payer: Self-pay | Admitting: Allergy and Immunology

## 2015-06-25 MED FILL — HYDROCHLOROTHIAZIDE 25 MG T: 25 | 90 days supply | Qty: 90 | Fill #0

## 2015-06-25 MED FILL — LOSARTAN POTASSIUM 50 MG TA: 50 | 90 days supply | Qty: 90 | Fill #0

## 2015-06-25 MED FILL — MONTELUKAST SOD 10 MG TAB: 10 | 30 days supply | Qty: 30 | Fill #2

## 2015-06-25 MED FILL — raNITIdine HCL 300 MG TABS: 300 | 30 days supply | Qty: 30 | Fill #2

## 2015-06-25 MED FILL — OMEPRAZOLE DR 40 MG CAPSULE: 40 | 30 days supply | Qty: 30 | Fill #2

## 2015-06-25 MED FILL — MINIVELLE 0.05 MG PATCH: 0.05 | 28 days supply | Qty: 8 | Fill #1

## 2015-06-25 MED FILL — PROGESTERONE 100 MG CAPSULE: 100 | 30 days supply | Qty: 30 | Fill #2

## 2015-07-10 MED FILL — BREO ELLIPTA 200-25 MCG INH: 200-25 | 30 days supply | Qty: 60 | Fill #2

## 2015-07-24 MED FILL — ALPRAZolam 0.25 MG TABS: 0.25 | 30 days supply | Qty: 60 | Fill #2

## 2015-07-24 MED FILL — PROGESTERONE 100 MG CAPSULE: 100 | 30 days supply | Qty: 30 | Fill #3

## 2015-07-24 MED FILL — raNITIdine HCL 300 MG TABS: 300 | 30 days supply | Qty: 30 | Fill #3

## 2015-07-24 MED FILL — MINIVELLE 0.05 MG PATCH: 0.05 | 28 days supply | Qty: 8 | Fill #2

## 2015-07-24 MED FILL — MONTELUKAST SOD 10 MG TAB: 10 | 30 days supply | Qty: 30 | Fill #3

## 2015-07-24 MED FILL — OMEPRAZOLE DR 40 MG CAPSULE: 40 | 30 days supply | Qty: 30 | Fill #3

## 2015-07-24 MED FILL — AMLODIPINE BESYLATE 5 MG TA: 5 | 90 days supply | Qty: 90 | Fill #1

## 2015-07-24 MED FILL — ESCITALOPRAM 20 MG TABLET: 20 | 30 days supply | Qty: 30 | Fill #1

## 2015-08-12 MED FILL — BREO ELLIPTA 200-25 MCG INH: 200-25 | 30 days supply | Qty: 60 | Fill #3

## 2015-08-13 ENCOUNTER — Encounter: Payer: Self-pay | Admitting: Allergy and Immunology

## 2015-08-13 ENCOUNTER — Ambulatory Visit (INDEPENDENT_AMBULATORY_CARE_PROVIDER_SITE_OTHER): Payer: 59 | Admitting: Allergy and Immunology

## 2015-08-13 VITALS — BP 140/98 | HR 72 | Resp 18 | Ht 65.0 in

## 2015-08-13 DIAGNOSIS — J309 Allergic rhinitis, unspecified: Secondary | ICD-10-CM | POA: Diagnosis not present

## 2015-08-13 DIAGNOSIS — H1010 Acute atopic conjunctivitis, unspecified eye: Secondary | ICD-10-CM | POA: Diagnosis not present

## 2015-08-13 DIAGNOSIS — J4540 Moderate persistent asthma, uncomplicated: Secondary | ICD-10-CM

## 2015-08-13 DIAGNOSIS — K219 Gastro-esophageal reflux disease without esophagitis: Secondary | ICD-10-CM

## 2015-08-13 DIAGNOSIS — J387 Other diseases of larynx: Secondary | ICD-10-CM | POA: Diagnosis not present

## 2015-08-13 MED ORDER — RANITIDINE HCL 300 MG PO TABS: ORAL_TABLET | ORAL | Status: DC

## 2015-08-13 MED ORDER — OMEPRAZOLE 40 MG PO CPDR: DELAYED_RELEASE_CAPSULE | ORAL | Status: DC

## 2015-08-13 MED ORDER — FLUTICASONE FUROATE-VILANTEROL 100-25 MCG/INH IN AEPB
1.0000 | INHALATION_SPRAY | Freq: Every day | RESPIRATORY_TRACT | Status: DC
Start: 2015-08-13 — End: 2016-03-04

## 2015-08-13 NOTE — Progress Notes (Signed)
Follow-up Note  Referring Provider: Elby Showers, MD Primary Provider: Elby Showers, MD Date of Office Visit: 08/13/2015  Subjective:   Jodi Bates (DOB: January 30, 1965) is a 50 y.o. female who returns to the [facility] on 08/13/2015 in re-evaluation of the following:  HPI: Azyiah returns to this clinic in reevaluation of her asthma, allergic rhinoconjunctivitis, and LPR. I've not seen her in his clinic since May 2017  While utilizing medical therapy for the past 12 weeks she has resolved all of her respiratory tract symptoms. She has no cough or shortness of breath, she has per little nasal symptoms, she has eliminated all throat clearing and drainage and has no reflux symptoms and does not use a short acting bronchodilator. She no longer consumes any caffeine and she has performed house dust avoidance measures. She continues on Rhinocort and Breo and montelukast as well as omeprazole and ranitidine    Medication List           albuterol (2.5 MG/3ML) 0.083% nebulizer solution  Commonly known as:  PROVENTIL  Take 3 mLs by nebulization every 6 (six) hours as needed. wheezing     VENTOLIN HFA 108 (90 Base) MCG/ACT inhaler  Generic drug:  albuterol  INHALE 2 PUFFS INTO THE LUNGS EVERY 6 HOURS AS NEEDED FOR WHEEZING OR SHORTNESS OF BREATH.     ALPRAZolam 0.25 MG tablet  Commonly known as:  XANAX  TAKE 1 TABLET BY MOUTH TWICE DAILY AS NEEDED FOR ANXIETY     amLODipine 5 MG tablet  Commonly known as:  NORVASC  Take 1 tablet (5 mg total) by mouth daily.     cholecalciferol 1000 units tablet  Commonly known as:  VITAMIN D  Take 1,000 Units by mouth daily.     escitalopram 10 MG tablet  Commonly known as:  LEXAPRO     fexofenadine 60 MG tablet  Commonly known as:  ALLEGRA  Take 60 mg by mouth 2 (two) times daily as needed for allergies. Reported on 08/13/2015     fluticasone furoate-vilanterol 200-25 MCG/INH Aepb  Commonly known as:  BREO ELLIPTA    Inhale 1 puff into the lungs daily.     HAIR VITAMINS PO  Take by mouth.     hydrochlorothiazide 25 MG tablet  Commonly known as:  HYDRODIURIL  TAKE 1 TABLET BY MOUTH ONCE DAILY     losartan 50 MG tablet  Commonly known as:  COZAAR  TAKE ONE TABLET BY MOUTH ONCE DAILY     magnesium oxide 400 MG tablet  Commonly known as:  MAG-OX  Take 400 mg by mouth 2 (two) times daily.     MINIVELLE 0.05 MG/24HR patch  Generic drug:  estradiol  Apply 0.05 mg topically 2 (two) times a week.     montelukast 10 MG tablet  Commonly known as:  SINGULAIR  Take one tablet once daily     omeprazole 40 MG capsule  Commonly known as:  PRILOSEC  TAKE ONE CAPSULE EACH MORNING BEFORE BREAKFAST     progesterone 100 MG capsule  Commonly known as:  PROMETRIUM  Take 100 mg by mouth daily.     ranitidine 300 MG tablet  Commonly known as:  ZANTAC  TAKE ONE TABLET EACH EVENING        Past Medical History  Diagnosis Date  . Headache(784.0)     tension  . Iron deficiency anemia     no current problems or meds.  . Gluteal  cleft wound 03/2012    recurrent gluteal cleft infections  . Hypertension     under control with meds., has been on med. > 5 yr.  . Asthma     prn inhalers    Past Surgical History  Procedure Laterality Date  . Tubal ligation    . Breast surgery       lt breast/ benign cyst  . Laparoscopic endometriosis fulguration    . Hysteroscopy w/d&c  06/25/2004  . Cyst excision      back of head  . Polypectomy      uterine  . Pilonidal cyst excision N/A 04/28/2012    Procedure: exam under anesthesia and exicision of pilonidal;  Surgeon: Gwenyth Ober, MD;  Location: [facility];  Service: General;  Laterality: N/A;  . Incision and drainage perirectal abscess N/A 02/10/2013    Procedure: IRRIGATION AND DEBRIDEMENT PERIRECTAL ABSCESS;  Surgeon: Gwenyth Ober, MD;  Location: [facility];  Service: General;  Laterality: N/A;    Allergies  Allergen Reactions  . Adhesive  [Tape] Itching and Other (See Comments)    BLISTERS  . Latex Itching, Other (See Comments) and Cough    SKIN IRRITATION  . Soap Other (See Comments)    TRIGGERS ASTHMA ATTACK - FRAGRANT SOAPS AND LOTIONS    Review of systems negative except as noted in HPI / PMHx or noted below:  Review of Systems  Constitutional: Negative.   HENT: Negative.   Eyes: Negative.   Respiratory: Negative.   Cardiovascular: Negative.   Gastrointestinal: Negative.   Genitourinary: Negative.   Musculoskeletal: Negative.   Skin: Negative.   Neurological: Negative.   Endo/Heme/Allergies: Negative.   Psychiatric/Behavioral: Negative.      Objective:   Filed Vitals:   08/13/15 1557 08/13/15 1608  BP: 148/108 140/98  Pulse: 72   Resp: 18    Height: 5\' 5"  (165.1 cm)      Physical Exam  Constitutional: She is well-developed, well-nourished, and in no distress.  HENT:  Head: Normocephalic.  Right Ear: Tympanic membrane, external ear and ear canal normal.  Left Ear: Tympanic membrane, external ear and ear canal normal.  Nose: Nose normal. No mucosal edema or rhinorrhea.  Mouth/Throat: Uvula is midline, oropharynx is clear and moist and mucous membranes are normal. No oropharyngeal exudate.  Eyes: Conjunctivae are normal.  Neck: Trachea normal. No tracheal tenderness present. No tracheal deviation present. No thyromegaly present.  Cardiovascular: Normal rate, regular rhythm, S1 normal, S2 normal and normal heart sounds.   No murmur heard. Pulmonary/Chest: Breath sounds normal. No stridor. No respiratory distress. She has no wheezes. She has no rales.  Musculoskeletal: She exhibits no edema.  Lymphadenopathy:       Head (right side): No tonsillar adenopathy present.       Head (left side): No tonsillar adenopathy present.    She has no cervical adenopathy.  Neurological: She is alert. Gait normal.  Skin: No rash noted. She is not diaphoretic. No erythema. Nails show no clubbing.  Psychiatric:  Mood and affect normal.    Diagnostics:    Spirometry was performed and demonstrated an FEV1 of 1.97 at 86 % of predicted.  The patient had an Asthma Control Test with the following results: ACT Total Score: 24.    Assessment and Plan:   1. Asthma, moderate persistent, well-controlled   2. Allergic rhinoconjunctivitis   3. LPRD (laryngopharyngeal reflux disease)     1. Continue to perform Allergen avoidance measures  2. Continue  to Treat and prevent inflammation:   A. Decrease OTC Rhinocort one spray each nostril Mon, Wed, Fri  B. Decrease Breo 100 one inhalation 1 time per day  C. Can stop montelukast 10 mg one tablet one time per day  3. Continue to Treat and prevent reflux:   A. continue to eliminate all caffeine and chocolate consumption    B. continue omeprazole 40 mg in morning  C. continue ranitidine 300 mg in evening  5. If needed:   A. Ventolin HFA 2 puffs every 4-6 hours  B. OTC antihistamine - Claritin/Allegra/Zyrtec  6. Return to clinic in 12 weeks or earlier if problem  7. Obtain fall flu vaccine  Lourdez has done excellent on her medical therapy and we'll now make an attempt to consolidate her treatment as specified above. We'll see we can lower her dose of nasal steroid 50% and lower her dose of inhaled steroid 50% and see if she does well without the use of a leukotriene modifier. I'll see her back in this clinic in 12 weeks or earlier if there is a problem. I've encouraged her to get the flu vaccine this fall.  Allena Katz, MD [place]

## 2015-08-13 NOTE — Patient Instructions (Addendum)
  1. Continue to perform Allergen avoidance measures  2. Continue to Treat and prevent inflammation:   A. Decrease OTC Rhinocort one spray each nostril Mon, Wed, Fri  B. Decrease Breo 100 one inhalation 1 time per day  C. Can stop montelukast 10 mg one tablet one time per day  3. Continue to Treat and prevent reflux:   A. continue to eliminate all caffeine and chocolate consumption    B. continue omeprazole 40 mg in morning  C. continue ranitidine 300 mg in evening  5. If needed:   A. Ventolin HFA 2 puffs every 4-6 hours  B. OTC antihistamine - Claritin/Allegra/Zyrtec  6. Return to clinic in 12 weeks or earlier if problem  7. Obtain fall flu vaccine

## 2015-08-16 ENCOUNTER — Telehealth: Payer: Self-pay | Admitting: Internal Medicine

## 2015-08-16 NOTE — Telephone Encounter (Signed)
She needs appt next week as they directed her to follow up here. Bring all meds with her.

## 2015-08-16 NOTE — Telephone Encounter (Signed)
Appointment given for Monday, 7/24 @ 2:45 p.m.

## 2015-08-16 NOTE — Telephone Encounter (Signed)
She has been having some issues with her BP.  She went to asthma/allergy specialist in May.  He changed her BP med to Losartan/HCTZ and she was taking that with Amlodipine.  See that Dr. On this past Tuesday; it was still in the 140's.  Wednesday it was still 146/103.  So, she took the Lisinopril with the Amlodipine instead of the Losartan/HCTZ.  She took BP today and it's 150/103, she hasn't yet took the Amlodipine.  She's concerned because it continues to rise.  States that she's concerned because it was controlled on Lisinopril/Amlodipine for years and now it's continuing to rise and she'd like to know what we need to do.    Please advise.

## 2015-08-19 ENCOUNTER — Ambulatory Visit (INDEPENDENT_AMBULATORY_CARE_PROVIDER_SITE_OTHER): Payer: 59 | Admitting: Internal Medicine

## 2015-08-19 ENCOUNTER — Encounter: Payer: Self-pay | Admitting: Internal Medicine

## 2015-08-19 VITALS — BP 160/106 | Temp 98.6°F | Ht 64.5 in | Wt 221.0 lb

## 2015-08-19 DIAGNOSIS — K219 Gastro-esophageal reflux disease without esophagitis: Secondary | ICD-10-CM

## 2015-08-19 DIAGNOSIS — Z8709 Personal history of other diseases of the respiratory system: Secondary | ICD-10-CM

## 2015-08-19 DIAGNOSIS — I10 Essential (primary) hypertension: Secondary | ICD-10-CM | POA: Diagnosis not present

## 2015-08-19 DIAGNOSIS — E669 Obesity, unspecified: Secondary | ICD-10-CM | POA: Diagnosis not present

## 2015-08-19 MED FILL — OMEPRAZOLE DR 40 MG CAPSULE: 40 | 90 days supply | Qty: 90 | Fill #0

## 2015-08-19 MED FILL — MINIVELLE 0.05 MG PATCH: 0.05 | 28 days supply | Qty: 8 | Fill #3

## 2015-08-19 MED FILL — raNITIdine HCL 300 MG TABS: 300 | 90 days supply | Qty: 90 | Fill #0

## 2015-08-19 MED FILL — PROGESTERONE 100 MG CAPSULE: 100 | 30 days supply | Qty: 30 | Fill #4

## 2015-08-24 NOTE — Progress Notes (Signed)
   Subjective:    Patient ID: Jodi Bates, female    DOB: 1965/04/20, 50 y.o.   MRN: [id number]  HPI  50 year old Black Female with long-standing history of hypertension has noted elevated blood pressure recently after change in medication.   History of hypertension dating back to 1999.  She had complained of cough and we sent her to see Dr. Neldon Mc, allergist, for allergy evaluation. He correctly pointed out that ACE inhibitor could be contributing to her cough. He changed her to losartan 50 mg daily. She is also on amlodipine and HCTZ. She has a history of asthma and is currently on Breo and is taking Zantac for reflux symptoms in addition to omeprazole.    Review of Systems as above     Objective:   Physical Exam Blood pressure today on arrival is 160/106 with pulse of 63. Pulse oximetry is 96. She weighs 221 pounds  On July 18, her blood pressure was 140/98  Skin warm and dry. Nodes none. No JVD thyromegaly or carotid bruits. Chest clear to auscultation. Cardiac exam regular rate and rhythm normal S1 and S2. Extremities without edema.     Assessment & Plan:  Essential hypertension-currently not well controlled  GE reflux  History of asthma  Plan: Increase losartan to 100 mg daily, continue amlodipine and HCTZ. Return August 4.

## 2015-08-24 NOTE — Patient Instructions (Signed)
Continue HCTZ. Increase losartan 100 mg daily and continue amlodipine. Return in August for

## 2015-08-26 MED FILL — ALPRAZolam 0.25 MG TABS: 0.25 | 30 days supply | Qty: 60 | Fill #3

## 2015-08-26 MED FILL — ESCITALOPRAM 20 MG TABLET: 20 | 30 days supply | Qty: 30 | Fill #2

## 2015-08-30 ENCOUNTER — Ambulatory Visit (INDEPENDENT_AMBULATORY_CARE_PROVIDER_SITE_OTHER): Payer: 59 | Admitting: Internal Medicine

## 2015-08-30 ENCOUNTER — Encounter: Payer: Self-pay | Admitting: Internal Medicine

## 2015-08-30 VITALS — BP 132/100 | HR 68 | Wt 219.0 lb

## 2015-08-30 DIAGNOSIS — I10 Essential (primary) hypertension: Secondary | ICD-10-CM | POA: Diagnosis not present

## 2015-08-30 MED ORDER — LOSARTAN POTASSIUM 100 MG PO TABS
100.0000 mg | ORAL_TABLET | Freq: Every day | ORAL | 0 refills | Status: DC
Start: 2015-08-30 — End: 2015-10-01

## 2015-08-30 MED ORDER — FUROSEMIDE 40 MG PO TABS: 40.0000 mg | ORAL_TABLET | Freq: Every day | ORAL | 3 refills | Status: DC

## 2015-08-30 MED ORDER — AMLODIPINE BESYLATE 5 MG PO TABS: 5.0000 mg | ORAL_TABLET | Freq: Every day | ORAL | 0 refills | Status: DC

## 2015-08-30 MED FILL — FUROSEMIDE 40 MG TABLET: 40 | 30 days supply | Qty: 30 | Fill #0

## 2015-08-30 MED FILL — LOSARTAN POTASSIUM 100 MG T: 100 | 30 days supply | Qty: 30 | Fill #0

## 2015-09-23 ENCOUNTER — Ambulatory Visit: Payer: 59 | Admitting: Internal Medicine

## 2015-09-25 MED FILL — BREO ELLIPTA 100-25 MCG INH: 100-25 | 90 days supply | Qty: 180 | Fill #0

## 2015-09-25 NOTE — Progress Notes (Addendum)
   Subjective:    Patient ID: Jodi Bates, female    DOB: Oct 29, 1965, 50 y.o.   MRN: [id number]  HPI 50 year old Female in today for follow-up on hypertension. At last visit in late July blood pressure was 160/106. She was on losartan 50 mg daily, amlodipine and HCTZ. We increased losartan to 100 mg daily. Blood pressure today is elevated at 132/100.    Review of Systems     Objective:   Physical Exam Neck supple without thyromegaly or carotid bruits. Chest clear to auscultation. Cardiac exam regular rate and rhythm normal S1 and S2. Extremities without edema.       Assessment & Plan:  Essential hypertension-Currently on HCTZ, losartan 100 mg daily and amlodipine.  Obesity  Plan: Blood pressure was rechecked and was improved at 130/84 compared to arrival.   Plan: Discontinue HCTZ. Add Lasix 40 mg daily. Continue amlodipine 5 mg daily. If we increase amlodipine, edema will be worse. Continue losartan 100 mg daily.Follow-up September 18.

## 2015-09-25 NOTE — Patient Instructions (Addendum)
Discontinue HCTZ. Lasix 40 mg daily. Amlodipine 5 mg daily. Losartan 100 mg daily. Follow-up in 3 weeks. We'll need basic metabolic panel at that time.

## 2015-09-26 MED FILL — MINIVELLE 0.05 MG PATCH: 0.05 | 28 days supply | Qty: 8 | Fill #4

## 2015-09-26 MED FILL — ESCITALOPRAM 20 MG TABLET: 20 | 30 days supply | Qty: 30 | Fill #3

## 2015-10-01 ENCOUNTER — Other Ambulatory Visit: Payer: Self-pay | Admitting: Internal Medicine

## 2015-10-01 MED FILL — FUROSEMIDE 40 MG TABLET: 40 | 30 days supply | Qty: 30 | Fill #1

## 2015-10-01 MED FILL — LOSARTAN POTASSIUM 100 MG T: 100 | 90 days supply | Qty: 90 | Fill #0

## 2015-10-01 MED FILL — ALPRAZolam 0.25 MG TABS: 0.25 | 30 days supply | Qty: 60 | Fill #4

## 2015-10-01 MED FILL — PROGESTERONE 100 MG CAPSULE: 100 | 30 days supply | Qty: 30 | Fill #5

## 2015-10-01 NOTE — Telephone Encounter (Signed)
Patient states Losartan was not combined into Losartan HCTZ. She was taken Losartan and Amlodipine 2hrs after.

## 2015-10-01 NOTE — Telephone Encounter (Signed)
Please call pt. I believe we combined this into Losartan HCTZ can you check with her?

## 2015-10-14 ENCOUNTER — Ambulatory Visit (INDEPENDENT_AMBULATORY_CARE_PROVIDER_SITE_OTHER): Payer: 59 | Admitting: Internal Medicine

## 2015-10-14 ENCOUNTER — Encounter: Payer: Self-pay | Admitting: Internal Medicine

## 2015-10-14 VITALS — BP 128/92 | Temp 97.0°F | Wt 224.5 lb

## 2015-10-14 DIAGNOSIS — E669 Obesity, unspecified: Secondary | ICD-10-CM | POA: Diagnosis not present

## 2015-10-14 DIAGNOSIS — I10 Essential (primary) hypertension: Secondary | ICD-10-CM | POA: Diagnosis not present

## 2015-10-14 MED ORDER — NEBIVOLOL HCL 5 MG PO TABS: 5.0000 mg | ORAL_TABLET | Freq: Every day | ORAL | 0 refills | Status: DC

## 2015-10-14 MED FILL — BYSTOLIC 5 MG TABLET: 5 | 30 days supply | Qty: 30 | Fill #0

## 2015-10-14 NOTE — Progress Notes (Signed)
   Subjective:    Patient ID: Jodi Bates, female    DOB: [birth year], 50 y.o.   MRN: [id number]  HPI For follow up of hypertension. Is on Losartan 100 mg daily, Lasix 40 mg daily, amlodipine 5 mg daily  To get flu vaccine at work.  Has several home BP readings some of which has elevated diastolic readings high 71G and 90s      Review of Systems see above     Objective:   Physical Exam  Skin warm and dry . Neck supple. Chest clear. Cor RRR  Ext without edema       Assessment & Plan:  Essential hypertension  Obesity- needs to see dietician  Plan: Add Bystolic 5 mg daily to Losartan, Lasix and Amlodipine. If we increase Amlodipine, she may develop edema. RTC 3 weeks. Will need B-met then.

## 2015-10-14 NOTE — Patient Instructions (Signed)
Add Bystolic to 5 mg daily and RTC  In 3 weeks. Call Iver Nestle for dietary counseling.

## 2015-10-21 MED FILL — MINIVELLE 0.05 MG PATCH: 0.05 | 28 days supply | Qty: 8 | Fill #5

## 2015-10-22 MED FILL — ESCITALOPRAM 20 MG TABLET: 20 | 30 days supply | Qty: 30 | Fill #4

## 2015-10-22 MED FILL — FUROSEMIDE 40 MG TABLET: 40 | 30 days supply | Qty: 30 | Fill #2

## 2015-10-22 MED FILL — PROGESTERONE 100 MG CAPSULE: 100 | 30 days supply | Qty: 30 | Fill #6

## 2015-10-31 ENCOUNTER — Other Ambulatory Visit: Payer: 59 | Admitting: Internal Medicine

## 2015-11-01 ENCOUNTER — Other Ambulatory Visit: Payer: 59 | Admitting: Internal Medicine

## 2015-11-01 DIAGNOSIS — I10 Essential (primary) hypertension: Secondary | ICD-10-CM

## 2015-11-01 LAB — BASIC METABOLIC PANEL
BUN: 14 mg/dL (ref 7–25)
CO2: 24 mmol/L (ref 20–31)
Calcium: 8.7 mg/dL (ref 8.6–10.2)
Chloride: 105 mmol/L (ref 98–110)
Creat: 0.95 mg/dL (ref 0.50–1.10)
GLUCOSE: 94 mg/dL (ref 65–99)
POTASSIUM: 3.8 mmol/L (ref 3.5–5.3)
Sodium: 141 mmol/L (ref 135–146)

## 2015-11-07 ENCOUNTER — Other Ambulatory Visit: Payer: Self-pay | Admitting: Internal Medicine

## 2015-11-07 MED FILL — BYSTOLIC 5 MG TABLET: 5 | 90 days supply | Qty: 90 | Fill #0

## 2015-11-07 MED FILL — AMLODIPINE BESYLATE 5 MG TA: 5 | 90 days supply | Qty: 90 | Fill #0

## 2015-11-07 MED FILL — ALPRAZolam 0.25 MG TABS: 0.25 | 30 days supply | Qty: 60 | Fill #5

## 2015-11-08 ENCOUNTER — Ambulatory Visit (INDEPENDENT_AMBULATORY_CARE_PROVIDER_SITE_OTHER): Payer: 59 | Admitting: Internal Medicine

## 2015-11-08 ENCOUNTER — Encounter: Payer: Self-pay | Admitting: Internal Medicine

## 2015-11-08 VITALS — BP 128/80 | HR 56 | Wt 222.0 lb

## 2015-11-08 DIAGNOSIS — I10 Essential (primary) hypertension: Secondary | ICD-10-CM

## 2015-11-08 MED ORDER — POTASSIUM CHLORIDE ER 10 MEQ PO TBCR: 10.0000 meq | EXTENDED_RELEASE_TABLET | Freq: Every day | ORAL | 1 refills | Status: DC

## 2015-11-19 ENCOUNTER — Ambulatory Visit (INDEPENDENT_AMBULATORY_CARE_PROVIDER_SITE_OTHER): Payer: 59 | Admitting: Family Medicine

## 2015-11-19 ENCOUNTER — Encounter: Payer: Self-pay | Admitting: Family Medicine

## 2015-11-19 DIAGNOSIS — E669 Obesity, unspecified: Secondary | ICD-10-CM

## 2015-11-19 DIAGNOSIS — R7302 Impaired glucose tolerance (oral): Secondary | ICD-10-CM

## 2015-11-19 NOTE — Patient Instructions (Addendum)
-   Exercise goal: Gym workout 3-5 X wk, to include at least 20-30 min cardio and 20 strength training.    - Advance planning weekly to work around your work hours.    - Monitor exercise by FitBit AND marking gym days on calendar.    - Food goal: Obtain twice as many veg's as protein or carbohydrate foods for both lunch and dinner on at least 5 days a week.  - TASTE PREFERENCES ARE LEARNED.  This means that it will get easier to choose foods you know are good for you if you are exposed to them enough.    - Make three lists of vegetables: (1) those you like and eat now; (2) vegetables you won't even consider; and (3) vegetables you might consider trying if they are prepared a certain way.  Continue to eat veg's you currently eat, but from this last list, choose a vegetable to try at least 3 times a week.  Use small amounts of this vegetable, cut small, combined with foods or seasonings you like.   - complete goals sheet, and bring to follow-up.    - Sleep: Listen to podcast:  Psychologist, occupational ([place]) Jodi Bates (Oct 16).

## 2015-11-19 NOTE — Progress Notes (Signed)
Medical Nutrition Therapy:  Appt start time: 1330 end time:  1430.  Assessment:  Primary concerns today: Weight management.   Learning Readiness: Change in progress Jodi Bates is currently taking the Weigh to Wellness II wt mgmt class.  She would like to learn to like more veg's and healthy foods.  She works at the [employer], where her work schedule changes each week.    Usual eating pattern includes 3 meals and 2-3 snacks per day. Frequent foods and beverages include water, chx, cucumbers, tomatoes, [place] sausage, eggs, protein bars, apples, pears.  Avoided foods include pork, beef, most dairy (lact intol), most veg's, onions, garlic, chocolates, tea, and fried foods (reflux).   Usual physical activity includes 30 min elliptical or TM ~2 X wk (before was working out 2-3 X wk).  Has a stair climber and elliptical at home.  (Plans to get back to doing weights 3 X wk.)   We reviewed ways of preparing veg's and of incorporating to more meals.  Also emphasized importance of greater exposure to new foods as a means of changing taste preferences.   24-hr recall: (Up at 5:15 AM) B (6:30 AM)-   1 frozn bkfst: [place] saus, egg, chs in pita, water Snk ( AM)-   --- L (2:30 PM)-  1 [employer] salad, [place], ~3 T f-f drsng, water Snk (3:30)-  1 almond butter cookie, water D (5 PM)-  2 slc pizza (chx & grn pep), water Snk ( PM)-  --- Typical day? Yes.  except for pizza; dinner is usually shrimp or chx or bkfst type of meal like [place] sausage and eggs.    Progress Towards Goal(s):  In progress.   Nutritional Diagnosis:  [place]-3.3 Overweight/obesity As related to energy imbalance.  As evidenced by BMI >37.    Intervention:  Nutrition education.    Handouts given during visit include:  AVS  Goals Sheet  Demonstrated degree of understanding via:  Teach Back  Barriers to learning/adherence to lifestyle change: Limited vegetables she likes.   Monitoring/Evaluation:  Dietary intake, exercise, and  body weight in 6 week(s).

## 2015-11-22 MED FILL — PROGESTERONE 100 MG CAPSULE: 100 | 30 days supply | Qty: 30 | Fill #7

## 2015-11-22 MED FILL — BREO ELLIPTA 100-25 MCG INH: 100-25 | 90 days supply | Qty: 180 | Fill #1

## 2015-11-22 MED FILL — MINIVELLE 0.05 MG PATCH: 0.05 | 28 days supply | Qty: 8 | Fill #6

## 2015-11-22 MED FILL — raNITIdine HCL 300 MG TABS: 300 | 90 days supply | Qty: 90 | Fill #1

## 2015-11-22 MED FILL — OMEPRAZOLE DR 40 MG CAPSULE: 40 | 90 days supply | Qty: 90 | Fill #1

## 2015-11-22 MED FILL — FUROSEMIDE 40 MG TABLET: 40 | 30 days supply | Qty: 30 | Fill #3

## 2015-11-22 MED FILL — ESCITALOPRAM 20 MG TABLET: 20 | 30 days supply | Qty: 30 | Fill #5

## 2015-12-10 ENCOUNTER — Ambulatory Visit (INDEPENDENT_AMBULATORY_CARE_PROVIDER_SITE_OTHER): Payer: 59 | Admitting: Allergy and Immunology

## 2015-12-10 ENCOUNTER — Encounter: Payer: Self-pay | Admitting: Allergy and Immunology

## 2015-12-10 VITALS — BP 152/96 | HR 72 | Resp 20

## 2015-12-10 DIAGNOSIS — K219 Gastro-esophageal reflux disease without esophagitis: Secondary | ICD-10-CM | POA: Diagnosis not present

## 2015-12-10 DIAGNOSIS — H1010 Acute atopic conjunctivitis, unspecified eye: Secondary | ICD-10-CM

## 2015-12-10 DIAGNOSIS — J309 Allergic rhinitis, unspecified: Secondary | ICD-10-CM | POA: Diagnosis not present

## 2015-12-10 DIAGNOSIS — J4540 Moderate persistent asthma, uncomplicated: Secondary | ICD-10-CM

## 2015-12-10 NOTE — Patient Instructions (Addendum)
  1. Continue to perform Allergen avoidance measures  2. Continue to Treat and prevent inflammation:   A. Continue Rhinocort one spray each nostril Mon, Wed, Fri  B. Continue Breo 100 one inhalation 1 time per day  3. Continue to Treat and prevent reflux:   A. continue to eliminate all caffeine and chocolate consumption    B. continue omeprazole 40 mg in morning  C. discontinue ranitidine  5. If needed:   A. Ventolin HFA 2 puffs every 4-6 hours  B. OTC antihistamine - Claritin/Allegra/Zyrtec  6. Return to clinic in 12 weeks or earlier if problem

## 2015-12-10 NOTE — Progress Notes (Signed)
Follow-up Note  Referring Provider: Elby Showers, MD Primary Provider: Elby Showers, MD Date of Office Visit: 12/10/2015  Subjective:   Jodi Bates (DOB: 09-04-1965) is a 50 y.o. female who returns to the [facility] on 12/10/2015 in re-evaluation of the following:  HPI: Lida returns to this clinic in reevaluation of her asthma and allergic rhinoconjunctivitis and LPR. I last saw her in his clinic in July 2017.  During the interval she has completely eliminated all respiratory tract symptoms while utilizing her medical plan. She does not have any shortness of breath or coughing or wheezing and has only uses her short-acting bronchodilator one time. She has very little issues with throat clearing or glob stuck in her throat or other issues associated with reflux. She has no problems with her nose. She has not required either a systemic steroid or an antibiotic to treat her respiratory tract issue.    Medication List      albuterol (2.5 MG/3ML) 0.083% nebulizer solution Commonly known as:  PROVENTIL Take 3 mLs by nebulization every 6 (six) hours as needed. wheezing   VENTOLIN HFA 108 (90 Base) MCG/ACT inhaler Generic drug:  albuterol INHALE 2 PUFFS INTO THE LUNGS EVERY 6 HOURS AS NEEDED FOR WHEEZING OR SHORTNESS OF BREATH.   ALPRAZolam 0.25 MG tablet Commonly known as:  XANAX TAKE 1 TABLET BY MOUTH TWICE DAILY AS NEEDED FOR ANXIETY   amLODipine 5 MG tablet Commonly known as:  NORVASC Take 1 tablet (5 mg total) by mouth daily.   BYSTOLIC 5 MG tablet Generic drug:  nebivolol TAKE 1 TABLET BY MOUTH DAILY.   cholecalciferol 1000 units tablet Commonly known as:  VITAMIN D Take 1,000 Units by mouth daily.   escitalopram 20 MG tablet Commonly known as:  LEXAPRO Take 1 tablet by mouth daily.   fexofenadine 60 MG tablet Commonly known as:  ALLEGRA Take 60 mg by mouth 2 (two) times daily as needed for allergies. Reported on 08/13/2015   fluticasone  furoate-vilanterol 100-25 MCG/INH Aepb Commonly known as:  BREO ELLIPTA Inhale 1 puff into the lungs daily.   furosemide 40 MG tablet Commonly known as:  LASIX Take 1 tablet (40 mg total) by mouth daily.   HAIR VITAMINS PO Take by mouth.   losartan 100 MG tablet Commonly known as:  COZAAR TAKE 1 TABLET BY MOUTH ONCE DAILY   magnesium oxide 400 MG tablet Commonly known as:  MAG-OX Take 400 mg by mouth 2 (two) times daily.   MINIVELLE 0.05 MG/24HR patch Generic drug:  estradiol Apply 0.05 mg topically 2 (two) times a week.   omeprazole 40 MG capsule Commonly known as:  PRILOSEC TAKE ONE CAPSULE EACH MORNING BEFORE BREAKFAST   potassium chloride 10 MEQ tablet Commonly known as:  K-DUR Take 1 tablet (10 mEq total) by mouth daily.   progesterone 100 MG capsule Commonly known as:  PROMETRIUM Take 100 mg by mouth daily.   ranitidine 300 MG tablet Commonly known as:  ZANTAC TAKE ONE TABLET EACH EVENING       Past Medical History:  Diagnosis Date  . Asthma    prn inhalers  . 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 Headache(784.0)    tension  . Hypertension    under control with meds., has been on med. > 5 yr.  . Iron deficiency anemia    no current problems or meds.    Past Surgical History:  Procedure Laterality  Date  . BREAST SURGERY      lt breast/ benign cyst  . CYST EXCISION     back of head  . HYSTEROSCOPY W/D&C  06/25/2004  . INCISION AND DRAINAGE PERIRECTAL ABSCESS N/A 02/10/2013   Procedure: IRRIGATION AND DEBRIDEMENT PERIRECTAL ABSCESS;  Surgeon: Gwenyth Ober, MD;  Location: [facility];  Service: General;  Laterality: N/A;  . LAPAROSCOPIC ENDOMETRIOSIS FULGURATION    . PILONIDAL CYST EXCISION N/A 04/28/2012   Procedure: exam under anesthesia and exicision of pilonidal;  Surgeon: Gwenyth Ober, MD;  Location: [facility];  Service: General;  Laterality: N/A;  . POLYPECTOMY     uterine  . TUBAL LIGATION       Allergies  Allergen Reactions  . Adhesive [Tape] Itching and Other (See Comments)    BLISTERS  . Latex Itching, Other (See Comments) and Cough    SKIN IRRITATION  . Soap Other (See Comments)    TRIGGERS ASTHMA ATTACK - FRAGRANT SOAPS AND LOTIONS    Review of systems negative except as noted in HPI / PMHx or noted below:  Review of Systems  Constitutional: Negative.   HENT: Negative.   Eyes: Negative.   Respiratory: Negative.   Cardiovascular: Negative.   Gastrointestinal: Negative.   Genitourinary: Negative.   Musculoskeletal: Negative.   Skin: Negative.   Neurological: Negative.   Endo/Heme/Allergies: Negative.   Psychiatric/Behavioral: Negative.      Objective:   Vitals:   12/10/15 1145  BP: (!) 152/96  Pulse: 72  Resp: 20          Physical Exam  Constitutional: She is well-developed, well-nourished, and in no distress.  HENT:  Head: Normocephalic.  Right Ear: Tympanic membrane, external ear and ear canal normal.  Left Ear: Tympanic membrane, external ear and ear canal normal.  Nose: Nose normal. No mucosal edema or rhinorrhea.  Mouth/Throat: Uvula is midline, oropharynx is clear and moist and mucous membranes are normal. No oropharyngeal exudate.  Eyes: Conjunctivae are normal.  Neck: Trachea normal. No tracheal tenderness present. No tracheal deviation present. No thyromegaly present.  Cardiovascular: Normal rate, regular rhythm, S1 normal, S2 normal and normal heart sounds.   No murmur heard. Pulmonary/Chest: Breath sounds normal. No stridor. No respiratory distress. She has no wheezes. She has no rales.  Musculoskeletal: She exhibits no edema.  Lymphadenopathy:       Head (right side): No tonsillar adenopathy present.       Head (left side): No tonsillar adenopathy present.    She has no cervical adenopathy.  Neurological: She is alert. Gait normal.  Skin: No rash noted. She is not diaphoretic. No erythema. Nails show no clubbing.  Psychiatric:  Mood and affect normal.    Diagnostics:    Spirometry was performed and demonstrated an FEV1 of 2.08 at 91 % of predicted.  Assessment and Plan:   1. Asthma, moderate persistent, well-controlled   2. Allergic rhinoconjunctivitis   3. LPRD (laryngopharyngeal reflux disease)     1. Continue to perform Allergen avoidance measures  2. Continue to Treat and prevent inflammation:   A. Continue Rhinocort one spray each nostril Mon, Wed, Fri  B. Continue Breo 100 one inhalation 1 time per day  3. Continue to Treat and prevent reflux:   A. continue to eliminate all caffeine and chocolate consumption    B. continue omeprazole 40 mg in morning  C. discontinue ranitidine  5. If needed:   A. Ventolin HFA 2 puffs every 4-6 hours  B.  OTC antihistamine - Claritin/Allegra/Zyrtec  6. Return to clinic in 12 weeks or earlier if problem  Shaneequa has once again done very well on her medical therapy and I think we can once again consolidate her treatment. For this step we will discontinue her ranitidine. She will continue to use omeprazole and Rhinocort and Breo as prescribed. She will contact me should she have significant problems in the face of this manipulation but otherwise I'll see her back in this clinic around February 2018 or earlier if there is a problem. There may be an opportunity to further consolidate her treatment without visit.  Allena Katz, MD [place]

## 2015-12-22 NOTE — Patient Instructions (Signed)
Basic metabolic panel is normal. Continue 4 drug regimen and return in 4-6 months

## 2015-12-22 NOTE — Progress Notes (Signed)
   Subjective:    Patient ID: Jodi Bates, female    DOB: 06-Aug-1965, 50 y.o.   MRN: [id number]  HPI Here today to follow-up on hypertension. Blood pressure is excellent today at 128/80. She is working with Iver Nestle for dietary counseling. Last seen here on September 18. At that time we added Bystolic 5 mg daily to losartan, Lasix and amlodipine.    Review of Systems as above. Feels well no new complaints     Objective:   Physical Exam  Skin warm and dry. Nodes none. Chest clear. Cardiac exam regular rate and rhythm normal S1 and S2. Extremities without edema.      Assessment & Plan:  Essential hypertension on 4 drug regimen including Lasix, losartan, amlodipine and Bystolic  History of asthma-followed by Dr. Neldon Mc  Impaired glucose tolerance  Plan: Continue this regimen. Basic metabolic panel drawn and pending. Continue dietary counseling. Return in 4-6 months.  Addendum: Basic metabolic panel normal. Potassium 3.8. We'll need to monitor potassium regularly. BUN/creatinine creatinine are normal.

## 2015-12-24 ENCOUNTER — Other Ambulatory Visit: Payer: Self-pay | Admitting: Internal Medicine

## 2015-12-24 MED FILL — VENTOLIN HFA 90 MCG INHALER: 108 (90 BAS | 90 days supply | Qty: 54 | Fill #1

## 2015-12-24 MED FILL — FUROSEMIDE 40 MG TABLET: 40 | 90 days supply | Qty: 90 | Fill #0

## 2015-12-24 MED FILL — ESCITALOPRAM 20 MG TABLET: 20 | 30 days supply | Qty: 30 | Fill #6

## 2015-12-24 MED FILL — PROGESTERONE 100 MG CAPSULE: 100 | 30 days supply | Qty: 30 | Fill #8

## 2015-12-25 MED FILL — MINIVELLE 0.05 MG PATCH: 0.05 | 28 days supply | Qty: 8 | Fill #7

## 2015-12-31 ENCOUNTER — Ambulatory Visit: Payer: 59 | Admitting: Family Medicine

## 2016-01-12 ENCOUNTER — Other Ambulatory Visit: Payer: Self-pay | Admitting: Internal Medicine

## 2016-01-13 ENCOUNTER — Other Ambulatory Visit: Payer: Self-pay | Admitting: Internal Medicine

## 2016-01-13 MED FILL — MINIVELLE 0.05 MG PATCH: 0.05 | 28 days supply | Qty: 8 | Fill #8

## 2016-01-13 MED FILL — LOSARTAN POTASSIUM 100 MG T: 100 | 90 days supply | Qty: 90 | Fill #0

## 2016-01-13 MED FILL — PROGESTERONE 100 MG CAPSULE: 100 | 30 days supply | Qty: 30 | Fill #9

## 2016-01-13 NOTE — Telephone Encounter (Signed)
Blood pressure up again at allergist recently. Needs to come back here for recheck in next few weeks.

## 2016-01-24 MED FILL — ESCITALOPRAM 20 MG TABLET: 20 | 30 days supply | Qty: 30 | Fill #7

## 2016-01-28 ENCOUNTER — Other Ambulatory Visit: Payer: Self-pay | Admitting: Internal Medicine

## 2016-01-28 DIAGNOSIS — Z1231 Encounter for screening mammogram for malignant neoplasm of breast: Secondary | ICD-10-CM

## 2016-02-05 DIAGNOSIS — H40013 Open angle with borderline findings, low risk, bilateral: Secondary | ICD-10-CM | POA: Diagnosis not present

## 2016-02-06 ENCOUNTER — Other Ambulatory Visit: Payer: Self-pay | Admitting: Internal Medicine

## 2016-02-06 MED ORDER — AMLODIPINE BESYLATE 5 MG PO TABS: 5.0000 mg | ORAL_TABLET | Freq: Every day | ORAL | 1 refills | Status: DC

## 2016-02-06 MED FILL — PROGESTERONE 100 MG CAPSULE: 100 | 30 days supply | Qty: 30 | Fill #10

## 2016-02-06 MED FILL — AMLODIPINE BESYLATE 5 MG TA: 5 | 90 days supply | Qty: 90 | Fill #0

## 2016-02-06 MED FILL — MINIVELLE 0.05 MG PATCH: 0.05 | 28 days supply | Qty: 8 | Fill #9

## 2016-02-06 MED FILL — BYSTOLIC 5 MG TABLET: 5 | 90 days supply | Qty: 90 | Fill #1

## 2016-02-07 ENCOUNTER — Encounter: Payer: Self-pay | Admitting: Internal Medicine

## 2016-02-07 ENCOUNTER — Ambulatory Visit (INDEPENDENT_AMBULATORY_CARE_PROVIDER_SITE_OTHER): Payer: 59 | Admitting: Internal Medicine

## 2016-02-07 VITALS — BP 130/90 | Wt 223.0 lb

## 2016-02-07 DIAGNOSIS — I10 Essential (primary) hypertension: Secondary | ICD-10-CM | POA: Diagnosis not present

## 2016-02-07 DIAGNOSIS — F439 Reaction to severe stress, unspecified: Secondary | ICD-10-CM

## 2016-02-07 NOTE — Patient Instructions (Signed)
Continue same medications and follow-up in 8-12 weeks
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
## 2016-02-18 ENCOUNTER — Ambulatory Visit
Admission: RE | Admit: 2016-02-18 | Discharge: 2016-02-18 | Disposition: A | Discharge status: Home / Self Care | Payer: 59 | Source: Ambulatory Visit | Attending: Internal Medicine | Admitting: Internal Medicine

## 2016-02-18 DIAGNOSIS — Z1231 Encounter for screening mammogram for malignant neoplasm of breast: Secondary | ICD-10-CM | POA: Diagnosis not present

## 2016-02-20 ENCOUNTER — Telehealth: Payer: Self-pay | Admitting: Internal Medicine

## 2016-02-20 ENCOUNTER — Emergency Department (HOSPITAL_COMMUNITY)
Admission: EM | Admit: 2016-02-20 | Discharge: 2016-02-20 | Disposition: A | Discharge status: Home / Self Care | Payer: 59 | Attending: Emergency Medicine | Admitting: Emergency Medicine

## 2016-02-20 ENCOUNTER — Encounter (HOSPITAL_COMMUNITY): Payer: Self-pay

## 2016-02-20 DIAGNOSIS — Z91040 Latex allergy status: Secondary | ICD-10-CM | POA: Insufficient documentation

## 2016-02-20 DIAGNOSIS — I10 Essential (primary) hypertension: Secondary | ICD-10-CM | POA: Diagnosis not present

## 2016-02-20 DIAGNOSIS — J45909 Unspecified asthma, uncomplicated: Secondary | ICD-10-CM | POA: Insufficient documentation

## 2016-02-20 DIAGNOSIS — R42 Dizziness and giddiness: Secondary | ICD-10-CM | POA: Insufficient documentation

## 2016-02-20 LAB — BASIC METABOLIC PANEL
Anion gap: 8 (ref 5–15)
BUN: 11 mg/dL (ref 6–20)
CO2: 26 mmol/L (ref 22–32)
Calcium: 8.8 mg/dL — ABNORMAL LOW (ref 8.9–10.3)
Chloride: 105 mmol/L (ref 101–111)
Creatinine, Ser: 0.87 mg/dL (ref 0.44–1.00)
GFR calc Af Amer: >60 mL/min (ref >60)
GFR calc non Af Amer: >60 mL/min (ref >60)
Glucose, Bld: 112 mg/dL — ABNORMAL HIGH (ref 65–99)
Potassium: 3.6 mmol/L (ref 3.5–5.1)
Sodium: 139 mmol/L (ref 135–145)

## 2016-02-20 LAB — CBC
HCT: 38.2 % (ref 36.0–46.0)
Hemoglobin: 12.8 g/dL (ref 12.0–15.0)
MCH: 30.2 pg (ref 26.0–34.0)
MCHC: 33.5 g/dL (ref 30.0–36.0)
MCV: 90.1 fL (ref 78.0–100.0)
Platelets: 192 10*3/uL (ref 150–400)
RBC: 4.24 MIL/uL (ref 3.87–5.11)
RDW: 13.6 % (ref 11.5–15.5)
WBC: 3 10*3/uL — ABNORMAL LOW (ref 4.0–10.5)

## 2016-02-20 LAB — URINALYSIS, ROUTINE W REFLEX MICROSCOPIC
Bilirubin Urine: NEGATIVE
Glucose, UA: NEGATIVE mg/dL
Ketones, ur: NEGATIVE mg/dL
Leukocytes, UA: NEGATIVE
Nitrite: NEGATIVE
Protein, ur: NEGATIVE mg/dL
Specific Gravity, Urine: 1.011 (ref 1.005–1.030)
pH: 7 (ref 5.0–8.0)

## 2016-02-20 LAB — CBG MONITORING, ED: Glucose-Capillary: 97 mg/dL (ref 65–99)

## 2016-02-20 NOTE — ED Notes (Signed)
Pt ambulatory to restroom with quick easy step.
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
## 2016-02-20 NOTE — Telephone Encounter (Signed)
Patient calling; states that she was just released from the ED.  Had a dizzy spell at work.  She was seen and released.  Advised that her heart rate is too low.  States that the doctor told her to stop taking the Bystolic and see you.  Her heart rate was 41.  She doesn't know how long she was to wait before seeing you.  The note is not yet dictated.    Advised that you will be notified of the ED visit and we will call her back to set up an appointment once the note has been dictated and we know what the dictation says and once you have reviewed the documentation.

## 2016-02-20 NOTE — Discharge Instructions (Signed)
Please stop taking bystolic and follow-up with your CP to discuss further. Your heart rate today is low. This may be contributing to your symptoms.

## 2016-02-20 NOTE — ED Notes (Signed)
Pt stable, understands discharge instructions, and reasons for return.   

## 2016-02-20 NOTE — Telephone Encounter (Signed)
Refer to Cardiology for BP management

## 2016-02-20 NOTE — ED Triage Notes (Signed)
Per Pt, Pt is an employee upstairs. Reports she was working when she got dizzy and light headed. Denied LOC, but reports initial dizziness went away, but pt is fatigued. Reports hx of the same in the past. Denies pain, numbness or tingling, slurred speech, weakness.

## 2016-02-24 MED FILL — ESCITALOPRAM 20 MG TABLET: 20 | 30 days supply | Qty: 30 | Fill #8

## 2016-02-27 ENCOUNTER — Other Ambulatory Visit: Payer: Self-pay | Admitting: Internal Medicine

## 2016-02-27 DIAGNOSIS — I10 Essential (primary) hypertension: Secondary | ICD-10-CM

## 2016-02-27 NOTE — Telephone Encounter (Signed)
Referral sent 

## 2016-03-03 MED FILL — OMEPRAZOLE DR 40 MG CAPSULE: 40 | 60 days supply | Qty: 60 | Fill #4

## 2016-03-04 ENCOUNTER — Other Ambulatory Visit: Payer: Self-pay | Admitting: *Deleted

## 2016-03-04 MED ORDER — FLUTICASONE FUROATE-VILANTEROL 100-25 MCG/INH IN AEPB: 1.0000 | INHALATION_SPRAY | Freq: Every day | RESPIRATORY_TRACT | 1 refills | Status: DC

## 2016-03-04 MED FILL — BREO ELLIPTA 100-25 MCG INH: 100-25 | 30 days supply | Qty: 60 | Fill #0

## 2016-03-10 MED FILL — MINIVELLE 0.05 MG PATCH: 0.05 | 28 days supply | Qty: 8 | Fill #0

## 2016-03-11 ENCOUNTER — Ambulatory Visit (INDEPENDENT_AMBULATORY_CARE_PROVIDER_SITE_OTHER): Payer: 59 | Admitting: Allergy and Immunology

## 2016-03-11 ENCOUNTER — Encounter: Payer: Self-pay | Admitting: Allergy and Immunology

## 2016-03-11 VITALS — BP 132/70 | HR 73 | Temp 97.7°F | Resp 17 | Ht 65.0 in | Wt 221.4 lb

## 2016-03-11 DIAGNOSIS — K219 Gastro-esophageal reflux disease without esophagitis: Secondary | ICD-10-CM

## 2016-03-11 DIAGNOSIS — H1010 Acute atopic conjunctivitis, unspecified eye: Secondary | ICD-10-CM | POA: Diagnosis not present

## 2016-03-11 DIAGNOSIS — J309 Allergic rhinitis, unspecified: Secondary | ICD-10-CM | POA: Diagnosis not present

## 2016-03-11 DIAGNOSIS — J4540 Moderate persistent asthma, uncomplicated: Secondary | ICD-10-CM | POA: Diagnosis not present

## 2016-03-11 NOTE — Patient Instructions (Signed)
  1. Continue to perform Allergen avoidance measures  2. Continue to Treat and prevent inflammation:   A. Continue Rhinocort one spray each nostril Mon, Wed, Fri  B. Continue Breo 100 one inhalation 1 time per day  3. Continue to Treat and prevent reflux:   A. continue to eliminate all caffeine and chocolate consumption    B. continue omeprazole 40 mg in morning  5. If needed:   A. Ventolin HFA 2 puffs every 4-6 hours  B. OTC antihistamine - Claritin/Allegra/Zyrtec  6. Return to clinic in Summer 2018 or earlier if problem
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
## 2016-03-12 ENCOUNTER — Other Ambulatory Visit: Payer: Self-pay

## 2016-03-12 NOTE — Telephone Encounter (Signed)
Patient was seen on yesterday and was calling to see if all her medications were [facility].   Please advise.

## 2016-03-13 MED ORDER — FLUTICASONE FUROATE-VILANTEROL 100-25 MCG/INH IN AEPB: 1.0000 | INHALATION_SPRAY | Freq: Every day | RESPIRATORY_TRACT | 5 refills | Status: DC

## 2016-03-13 MED ORDER — ALBUTEROL SULFATE HFA 108 (90 BASE) MCG/ACT IN AERS: 2.0000 | INHALATION_SPRAY | Freq: Four times a day (QID) | RESPIRATORY_TRACT | 1 refills | Status: DC | PRN

## 2016-03-13 MED ORDER — OMEPRAZOLE 40 MG PO CPDR: 40.0000 mg | DELAYED_RELEASE_CAPSULE | Freq: Every day | ORAL | 5 refills | Status: DC

## 2016-03-13 NOTE — Telephone Encounter (Signed)
Breo, omeprazole, and albuterol inhaler sent to [facility] outpatient pharmacy.

## 2016-03-24 MED FILL — ESCITALOPRAM 20 MG TABLET: 20 | 30 days supply | Qty: 30 | Fill #0

## 2016-03-31 ENCOUNTER — Telehealth: Payer: Self-pay | Admitting: Internal Medicine

## 2016-03-31 MED ORDER — FUROSEMIDE 40 MG PO TABS: 40.0000 mg | ORAL_TABLET | Freq: Every day | ORAL | 0 refills | Status: DC

## 2016-03-31 MED ORDER — LOSARTAN POTASSIUM 100 MG PO TABS: 100.0000 mg | ORAL_TABLET | Freq: Every day | ORAL | 0 refills | Status: DC

## 2016-03-31 MED FILL — FUROSEMIDE 40 MG TABLET: 40 | 30 days supply | Qty: 30 | Fill #0

## 2016-03-31 MED FILL — LOSARTAN POTASSIUM 100 MG T: 100 | 30 days supply | Qty: 30 | Fill #0

## 2016-03-31 MED FILL — BREO ELLIPTA 100-25 MCG INH: 100-25 | 30 days supply | Qty: 60 | Fill #0

## 2016-03-31 NOTE — Telephone Encounter (Signed)
Please refill.

## 2016-03-31 NOTE — Telephone Encounter (Signed)
Escribed

## 2016-03-31 NOTE — Telephone Encounter (Addendum)
Patient needs her Losartan and Furosemide medication refilled until she goes to her Cardiologist appointment in April.

## 2016-04-09 MED FILL — MINIVELLE 0.05 MG PATCH: 0.05 | 28 days supply | Qty: 8 | Fill #0

## 2016-04-13 MED FILL — PROGESTERONE 100 MG CAPSULE: 100 | 30 days supply | Qty: 30 | Fill #0

## 2016-04-21 DIAGNOSIS — Z6838 Body mass index (BMI) 38.0-38.9, adult: Secondary | ICD-10-CM | POA: Diagnosis not present

## 2016-04-21 DIAGNOSIS — Z01419 Encounter for gynecological examination (general) (routine) without abnormal findings: Secondary | ICD-10-CM | POA: Diagnosis not present

## 2016-04-21 MED FILL — MINIVELLE 0.075 MG PATCH: 0.075 | 28 days supply | Qty: 8 | Fill #0

## 2016-04-21 MED FILL — ESCITALOPRAM 20 MG TABLET: 20 | 30 days supply | Qty: 30 | Fill #0

## 2016-04-22 ENCOUNTER — Other Ambulatory Visit: Payer: Self-pay | Admitting: Internal Medicine

## 2016-04-22 NOTE — Telephone Encounter (Signed)
Refill x 6 months 

## 2016-04-23 ENCOUNTER — Encounter: Payer: Self-pay | Admitting: Gastroenterology

## 2016-05-05 ENCOUNTER — Ambulatory Visit: Payer: 59 | Admitting: Cardiovascular Disease

## 2016-05-07 MED FILL — BREO ELLIPTA 100-25 MCG INH: 100-25 | 30 days supply | Qty: 60 | Fill #1

## 2016-05-07 MED FILL — AMLODIPINE BESYLATE 5 MG TA: 5 | 90 days supply | Qty: 90 | Fill #1

## 2016-05-08 ENCOUNTER — Encounter: Payer: Self-pay | Admitting: Internal Medicine

## 2016-05-08 ENCOUNTER — Other Ambulatory Visit: Payer: Self-pay

## 2016-05-08 ENCOUNTER — Telehealth: Payer: Self-pay | Admitting: Internal Medicine

## 2016-05-08 MED ORDER — FUROSEMIDE 40 MG PO TABS
40.0000 mg | ORAL_TABLET | Freq: Every day | ORAL | 0 refills | Status: DC
Start: 2016-05-08 — End: 2016-06-04

## 2016-05-08 MED ORDER — LOSARTAN POTASSIUM 100 MG PO TABS: 100.0000 mg | ORAL_TABLET | Freq: Every day | ORAL | 0 refills | Status: DC

## 2016-05-08 MED ORDER — AMLODIPINE BESYLATE 5 MG PO TABS: 5.0000 mg | ORAL_TABLET | Freq: Every day | ORAL | 1 refills | Status: DC

## 2016-05-08 MED FILL — LOSARTAN POTASSIUM 100 MG T: 100 | 30 days supply | Qty: 30 | Fill #0

## 2016-05-08 MED FILL — FUROSEMIDE 40 MG TABLET: 40 | 30 days supply | Qty: 30 | Fill #0

## 2016-05-08 NOTE — Telephone Encounter (Signed)
Has appointment in May for Cardiology evaluation Of HTN. Refill meds through May. Needs CPE in the near future.

## 2016-05-12 DIAGNOSIS — Z13820 Encounter for screening for osteoporosis: Secondary | ICD-10-CM | POA: Diagnosis not present

## 2016-05-20 MED FILL — ESCITALOPRAM 20 MG TABLET: 20 | 30 days supply | Qty: 30 | Fill #1

## 2016-05-28 MED FILL — OMEPRAZOLE DR 40 MG CAPSULE: 40 | 90 days supply | Qty: 90 | Fill #0

## 2016-06-01 MED FILL — BREO ELLIPTA 100-25 MCG INH: 100-25 | 30 days supply | Qty: 60 | Fill #2

## 2016-06-01 MED FILL — MINIVELLE 0.075 MG PATCH: 0.075 | 28 days supply | Qty: 8 | Fill #1

## 2016-06-02 ENCOUNTER — Ambulatory Visit (AMBULATORY_SURGERY_CENTER): Payer: Self-pay

## 2016-06-02 VITALS — Ht 64.0 in | Wt 224.4 lb

## 2016-06-02 DIAGNOSIS — Z1211 Encounter for screening for malignant neoplasm of colon: Secondary | ICD-10-CM

## 2016-06-02 MED ORDER — NA SULFATE-K SULFATE-MG SULF 17.5-3.13-1.6 GM/177ML PO SOLN: 1.0000 | Freq: Once | ORAL | 0 refills | Status: AC

## 2016-06-02 MED FILL — SUPREP BOWEL PREP KIT: 17.5-3.13-1 | 1 days supply | Qty: 354 | Fill #0

## 2016-06-02 NOTE — Progress Notes (Signed)
Denies allergies to eggs or soy products. Denies complication of anesthesia or sedation. Denies use of weight loss medication. Denies use of O2.   Emmi instructions given for colonoscopy.  

## 2016-06-04 ENCOUNTER — Encounter: Payer: Self-pay | Admitting: Gastroenterology

## 2016-06-04 ENCOUNTER — Encounter: Payer: Self-pay | Admitting: Cardiovascular Disease

## 2016-06-04 ENCOUNTER — Ambulatory Visit (INDEPENDENT_AMBULATORY_CARE_PROVIDER_SITE_OTHER): Payer: 59 | Admitting: Cardiovascular Disease

## 2016-06-04 VITALS — BP 141/92 | HR 59 | Ht 64.0 in | Wt 224.4 lb

## 2016-06-04 DIAGNOSIS — I10 Essential (primary) hypertension: Secondary | ICD-10-CM | POA: Diagnosis not present

## 2016-06-04 DIAGNOSIS — R55 Syncope and collapse: Secondary | ICD-10-CM | POA: Diagnosis not present

## 2016-06-04 DIAGNOSIS — R001 Bradycardia, unspecified: Secondary | ICD-10-CM

## 2016-06-04 MED ORDER — HYDROCHLOROTHIAZIDE 12.5 MG PO CAPS: 12.5000 mg | ORAL_CAPSULE | Freq: Every day | ORAL | 3 refills | Status: DC

## 2016-06-04 MED ORDER — LOSARTAN POTASSIUM 100 MG PO TABS: 100.0000 mg | ORAL_TABLET | Freq: Every day | ORAL | 3 refills | Status: DC

## 2016-06-04 MED ORDER — AMLODIPINE BESYLATE 5 MG PO TABS: 5.0000 mg | ORAL_TABLET | Freq: Every day | ORAL | 3 refills | Status: DC

## 2016-06-04 MED FILL — LOSARTAN POTASSIUM 100 MG T: 100 | 90 days supply | Qty: 90 | Fill #0

## 2016-06-04 MED FILL — HYDROCHLOROTHIAZIDE 12.5 MG: 12.5 | 90 days supply | Qty: 90 | Fill #0

## 2016-06-04 NOTE — Patient Instructions (Signed)
Medication Instructions:  STOP FUROSEMIDE   START HYDROCHLOROTHIAZIDE 12.5 MG DAILY   Labwork: NONE  Testing/Procedures: NONE  Follow-Up: Your physician recommends that you schedule a follow-up appointment in: [place]  If you need a refill on your cardiac medications before your next appointment, please call your pharmacy.

## 2016-06-04 NOTE — Progress Notes (Signed)
Cardiology Office Note   Date:  06/04/2016   ID:  Jodi Bates, DOB 22-Dec-1965, MRN [id number]  PCP:  Elby Showers, MD  Cardiologist:   [name], MD   Chief Complaint  Patient presents with  . New Patient (Initial Visit)      History of Present Illness: Jodi Bates is a 51 y.o. female with asthma and hypertension who is being seen today for the evaluation of Hypotension and near syncope at the request of Baxley, Cresenciano Lick, MD.  Jodi Bates was seen in the emergency department 01/2016 for episode of near-syncope. 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 There is no associated palpitations, chest pain, or shortness of breath. She was seen in the emergency department and her heart rate was in the 40s. At the time she was taking nebivolol. This was discontinued and she has not had any recurrent episodes. Attention was also feeling very stressed. Her son had been in a motor vehicle accident that resulted in traumatic brain injury.  That day she had been discussing whether or not to pursue tracheotomy. He has since returned home and is doing better, but still has several medical concerns.  2 days after that event her brother was diagnosed with pancreatic cancer. He died 2 weeks later.  Jodi Bates was diagnosed with hypertension many years ago. She has not experienced any chest pain, shortness of breath, lower extremity edema, orthopnea, or PND.  She started exercising 2 weeks ago and feels good with exercise.  She walks on the treadmill for 30 minutes and then lifts weights.  She tries to do this 4-5 times per week.     Past Medical History:  Diagnosis Date  . Allergy   . Anxiety   . Asthma    prn inhalers  . GERD (gastroesophageal reflux disease)   . 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 Headache(784.0)    tension  . Hypertension    under control with meds., has been on  med. > 5 yr.  . Iron deficiency anemia    no current problems or meds.    Past Surgical History:  Procedure Laterality Date  . BREAST SURGERY      lt breast/ benign cyst  . CYST EXCISION     back of head  . HYSTEROSCOPY W/D&C  06/25/2004  . INCISION AND DRAINAGE PERIRECTAL ABSCESS N/A 02/10/2013   Procedure: IRRIGATION AND DEBRIDEMENT PERIRECTAL ABSCESS;  Surgeon: Gwenyth Ober, MD;  Location: [facility];  Service: General;  Laterality: N/A;  . LAPAROSCOPIC ENDOMETRIOSIS FULGURATION    . PILONIDAL CYST EXCISION N/A 04/28/2012   Procedure: exam under anesthesia and exicision of pilonidal;  Surgeon: Gwenyth Ober, MD;  Location: [facility];  Service: General;  Laterality: N/A;  . POLYPECTOMY     uterine  . TUBAL LIGATION       Current Outpatient Prescriptions  Medication Sig Dispense Refill  . albuterol (PROVENTIL HFA;VENTOLIN HFA) 108 (90 Base) MCG/ACT inhaler Inhale 2 puffs into the lungs every 6 (six) hours as needed for wheezing or shortness of breath. 1 Inhaler 1  . albuterol (PROVENTIL) (2.5 MG/3ML) 0.083% nebulizer solution Take 3 mLs by nebulization every 6 (six) hours as needed for wheezing or shortness of breath.   1  . ALPRAZolam (XANAX) 0.25 MG tablet TAKE 1 TABLET BY MOUTH TWICE DAILY AS NEEDED 60 tablet 5  .  amLODipine (NORVASC) 5 MG tablet Take 1 tablet (5 mg total) by mouth daily. 90 tablet 3  . budesonide (RHINOCORT ALLERGY) 32 MCG/ACT nasal spray Place 1 spray into both nostrils daily.    Marland Kitchen escitalopram (LEXAPRO) 20 MG tablet Take 20 mg by mouth at bedtime.   8  . fluticasone furoate-vilanterol (BREO ELLIPTA) 100-25 MCG/INH AEPB Inhale 1 puff into the lungs daily. 1 each 5  . loratadine (CLARITIN) 10 MG tablet Take 10 mg by mouth daily.    Marland Kitchen losartan (COZAAR) 100 MG tablet Take 1 tablet (100 mg total) by mouth daily. 90 tablet 3  . MINIVELLE 0.05 MG/24HR patch Place 1 patch onto the skin 2 (two) times a week. Pt changes patch on Monday and Thursday.  3  .  Multiple Vitamin (MULTIVITAMIN WITH MINERALS) TABS tablet Take 1 tablet by mouth daily.    Marland Kitchen omeprazole (PRILOSEC) 40 MG capsule Take 1 capsule (40 mg total) by mouth daily before breakfast. 30 capsule 5  . progesterone (PROMETRIUM) 100 MG capsule Take 100 mg by mouth at bedtime.     . hydrochlorothiazide (MICROZIDE) 12.5 MG capsule Take 1 capsule (12.5 mg total) by mouth daily. 90 capsule 3   No current facility-administered medications for this visit.     Allergies:   Adhesive [tape]; Latex; and Soap    Social History:  The patient  reports that she has never smoked. She has never used smokeless tobacco. She reports that she does not drink alcohol or use drugs.   Family History:  The patient's family history includes Allergic rhinitis in her mother and son; Asthma in her brother and son; Cancer in her father and maternal grandmother; Diabetes in her brother and mother; Esophageal cancer in her father; Heart failure in her mother; Hypertension in her mother; Pancreatic cancer in her brother; Sleep apnea in her brother.    ROS:  Please see the history of present illness.   Otherwise, review of systems are positive for none.   All other systems are reviewed and negative.    PHYSICAL EXAM: VS:  BP (!) 141/92   Pulse (!) 59   Ht 5\' 4"  (1.626 m)   Wt 101.8 kg (224 lb 6.4 oz)   BMI 38.52 kg/m  , BMI Body mass index is 38.52 kg/m. GENERAL:  Well appearing HEENT:  Pupils equal round and reactive, fundi not visualized, oral mucosa unremarkable NECK:  No jugular venous distention, waveform within normal limits, carotid upstroke brisk and symmetric, no bruits, no thyromegaly LYMPHATICS:  No cervical adenopathy LUNGS:  Clear to auscultation bilaterally HEART:  RRR.  PMI not displaced or sustained,S1 and S2 within normal limits, no S3, no S4, no clicks, no rubs, no murmurs ABD:  Flat, positive bowel sounds normal in frequency in pitch, no bruits, no rebound, no guarding, no midline pulsatile  mass, no hepatomegaly, no splenomegaly EXT:  2 plus pulses throughout, no edema, no cyanosis no clubbing SKIN:  No rashes no nodules NEURO:  Cranial nerves II through XII grossly intact, motor grossly intact throughout PSYCH:  Cognitively intact, oriented to person place and time    EKG:  EKG is ordered today. The ekg ordered today demonstrates sinus arrhythmia.  Rate 59 bpm.  Low voltage limb leads.  Cannot rule out prior septal infarct.    Recent Labs: 02/20/2016: BUN 11; Creatinine, Ser 0.87; Hemoglobin 12.8; Platelets 192; Potassium 3.6; Sodium 139    Lipid Panel    Component Value Date/Time   CHOL 184 03/25/2015  0905   TRIG 81 03/25/2015 0905   HDL 58 03/25/2015 0905   CHOLHDL 3.2 03/25/2015 0905   VLDL 16 03/25/2015 0905   LDLCALC 110 03/25/2015 0905      Wt Readings from Last 3 Encounters:  06/04/16 101.8 kg (224 lb 6.4 oz)  06/02/16 101.8 kg (224 lb 6.4 oz)  03/11/16 100.4 kg (221 lb 6.4 oz)      ASSESSMENT AND PLAN:  # Hypertension: Blood pressure remains above goal.  We will stop furosemide and start HCTZ 12.5mg  daily.  Consider switching to losartan/HCTZ once the dose is more clear.  Continue losartan 100 mg daily and amlodipine.  Avoid nodal agents due to bradycardia.  I have asked her to keep a log of her BP.   # Near syncope: # Bradycardia: No recurrent episodes since stopping nebivolol.  Avoid nodal agents as above.   Current medicines are reviewed at length with the patient today.  The patient does not have concerns regarding medicines.  The following changes have been made:  Stop furosemide.  Start HCTZ.  Labs/ tests ordered today include:  No orders of the defined types were placed in this encounter.    Disposition:   FU with Dalonte Hardage C. Oval Linsey, MD, [facility] in 1 month    This note was written with the assistance of speech recognition software.  Please excuse any transcriptional errors.  Signed, Kirrah Mustin C. Oval Linsey, MD, [facility]  06/04/2016 5:49 PM     [place]

## 2016-06-16 ENCOUNTER — Ambulatory Visit (AMBULATORY_SURGERY_CENTER): Payer: 59 | Admitting: Gastroenterology

## 2016-06-16 ENCOUNTER — Encounter: Payer: Self-pay | Admitting: Gastroenterology

## 2016-06-16 VITALS — BP 154/95 | HR 59 | Temp 99.1°F | Resp 16 | Ht 64.0 in | Wt 224.0 lb

## 2016-06-16 DIAGNOSIS — Z1212 Encounter for screening for malignant neoplasm of rectum: Secondary | ICD-10-CM

## 2016-06-16 DIAGNOSIS — Z1211 Encounter for screening for malignant neoplasm of colon: Secondary | ICD-10-CM | POA: Diagnosis present

## 2016-06-16 DIAGNOSIS — J45909 Unspecified asthma, uncomplicated: Secondary | ICD-10-CM | POA: Diagnosis not present

## 2016-06-16 DIAGNOSIS — I10 Essential (primary) hypertension: Secondary | ICD-10-CM | POA: Diagnosis not present

## 2016-06-16 DIAGNOSIS — Z6838 Body mass index (BMI) 38.0-38.9, adult: Secondary | ICD-10-CM | POA: Diagnosis not present

## 2016-06-16 MED ORDER — SODIUM CHLORIDE 0.9 % IV SOLN: 500.0000 mL | INTRAVENOUS | Status: DC

## 2016-06-16 NOTE — Progress Notes (Signed)
Pt's states no medical or surgical changes since previsit or office visit. 

## 2016-06-16 NOTE — Patient Instructions (Signed)
YOU HAD AN ENDOSCOPIC PROCEDURE TODAY AT THE [facility]:   Refer to the procedure report that was given to you for any specific questions about what was found during the examination.  If the procedure report does not answer your questions, please call your gastroenterologist to clarify.  If you requested that your care partner not be given the details of your procedure findings, then the procedure report has been included in a sealed envelope for you to review at your convenience later.  YOU SHOULD EXPECT: Some feelings of bloating in the abdomen. Passage of more gas than usual.  Walking can help get rid of the air that was put into your GI tract during the procedure and reduce the bloating. If you had a lower endoscopy (such as a colonoscopy or flexible sigmoidoscopy) you may notice spotting of blood in your stool or on the toilet paper. If you underwent a bowel prep for your procedure, you may not have a normal bowel movement for a few days.  Please Note:  You might notice some irritation and congestion in your nose or some drainage.  This is from the oxygen used during your procedure.  There is no need for concern and it should clear up in a day or so.  SYMPTOMS TO REPORT IMMEDIATELY:   Following lower endoscopy (colonoscopy or flexible sigmoidoscopy):  Excessive amounts of blood in the stool  Significant tenderness or worsening of abdominal pains  Swelling of the abdomen that is new, acute  Fever of 100F or higher   For urgent or emergent issues, a gastroenterologist can be reached at any hour by calling [phone].   DIET:  We do recommend a small meal at first, but then you may proceed to your regular diet.  Drink plenty of fluids but you should avoid alcoholic beverages for 24 hours.  ACTIVITY:  You should plan to take it easy for the rest of today and you should NOT DRIVE or use heavy machinery until tomorrow (because of the sedation medicines used during the test).     FOLLOW UP: Our staff will call the number listed on your records the next business day following your procedure to check on you and address any questions or concerns that you may have regarding the information given to you following your procedure. If we do not reach you, we will leave a message.  However, if you are feeling well and you are not experiencing any problems, there is no need to return our call.  We will assume that you have returned to your regular daily activities without incident.  If any biopsies were taken you will be contacted by phone or by letter within the next 1-3 weeks.  Please call us at [phone] if you have not heard about the biopsies in 3 weeks.    SIGNATURES/CONFIDENTIALITY: You and/or your care partner have signed paperwork which will be entered into your electronic medical record.  These signatures attest to the fact that that the information above on your After Visit Summary has been reviewed and is understood.  Full responsibility of the confidentiality of this discharge information lies with you and/or your care-partner.    Handouts were given to your care partner on diverticulosis and hemorrhoids. You may resume your current medications today. Repeat colonoscopy in 10 years for screening purposes.. Please call if any questions or concerns.   

## 2016-06-16 NOTE — Progress Notes (Signed)
  [facility] Post-op Note  Patient: CHRYSTAL ZEIMET  Procedure(s) Performed: colonoscopy  Patient Location: [facility] - Recovery Area  Anesthesia Type: Deep Sedation/Propofol  Level of Consciousness: awake, oriented and patient cooperative  Airway and Oxygen Therapy: Patient Spontanous Breathing  Post-op Pain: none  Post-op Assessment:  Post-op Vital signs reviewed, Patient's Cardiovascular Status Stable, Respiratory Function Stable, Patent Airway, No signs of Nausea or vomiting and Pain level controlled  Post-op Vital Signs: Reviewed and stable  Complications: No apparent anesthesia complications  Carry Ortez E Laaibah Wartman 11:54 AM

## 2016-06-16 NOTE — Progress Notes (Signed)
No problems noted in the recovery room. Maw  Paper tape used when IV removed. maw

## 2016-06-16 NOTE — Op Note (Signed)
[facility] Patient Name: Jodi Bates Procedure Date: 06/16/2016 11:27 AM MRN: [id number] Endoscopist: Mauri Pole , MD Age: 51 Referring MD:  Date of Birth: [birth year] Gender: Female Account #: [id number] Procedure:                Colonoscopy Indications:              Screening for colorectal malignant neoplasm, This                            is the patient's first colonoscopy Medicines:                Monitored Anesthesia Care Procedure:                Pre-Anesthesia Assessment:                           - Prior to the procedure, a History and Physical                            was performed, and patient medications and                            allergies were reviewed. The patient's tolerance of                            previous anesthesia was also reviewed. The risks                            and benefits of the procedure and the sedation                            options and risks were discussed with the patient.                            All questions were answered, and informed consent                            was obtained. Prior Anticoagulants: The patient has                            taken no previous anticoagulant or antiplatelet                            agents. ASA Grade Assessment: II - A patient with                            mild systemic disease. After reviewing the risks                            and benefits, the patient was deemed in                            satisfactory condition to undergo the procedure.  After obtaining informed consent, the colonoscope                            was passed under direct vision. Throughout the                            procedure, the patient's blood pressure, pulse, and                            oxygen saturations were monitored continuously. The                            Colonoscope was introduced through the anus and                            advanced to the the  terminal ileum, with                            identification of the appendiceal orifice and IC                            valve. The colonoscopy was performed without                            difficulty. The patient tolerated the procedure                            well. The quality of the bowel preparation was                            excellent. The terminal ileum, ileocecal valve,                            appendiceal orifice, and rectum were photographed. Scope In: 11:36:07 AM Scope Out: 11:48:23 AM Scope Withdrawal Time: 0 hours 7 minutes 28 seconds  Total Procedure Duration: 0 hours 12 minutes 16 seconds  Findings:                 The perianal and digital rectal examinations were                            normal.                           Scattered small and large-mouthed diverticula were                            found in the sigmoid colon, transverse colon and                            ascending colon.                           Non-bleeding internal hemorrhoids were found during  retroflexion. The hemorrhoids were small.                           The exam was otherwise without abnormality. Complications:            No immediate complications. Estimated Blood Loss:     Estimated blood loss: none. Impression:               - Diverticulosis in the sigmoid colon, in the                            transverse colon and in the ascending colon.                           - Non-bleeding internal hemorrhoids.                           - The examination was otherwise normal.                           - No specimens collected. Recommendation:           - Patient has a contact number available for                            emergencies. The signs and symptoms of potential                            delayed complications were discussed with the                            patient. Return to normal activities tomorrow.                            Written  discharge instructions were provided to the                            patient.                           - Resume previous diet.                           - Continue present medications.                           - Await pathology results.                           - Repeat colonoscopy in 10 years for screening                            purposes. Mauri Pole, MD 06/16/2016 11:52:39 AM This report has been signed electronically.

## 2016-06-17 ENCOUNTER — Telehealth: Payer: Self-pay | Admitting: *Deleted

## 2016-06-17 NOTE — Telephone Encounter (Signed)
  Follow up Call-  Call back number 06/16/2016  Post procedure Call Back phone  # [phone]  Permission to leave phone message Yes  Some recent data might be hidden     Patient questions:  Do you have a fever, pain , or abdominal swelling? No. Pain Score  0 *  Have you tolerated food without any problems? Yes.    Have you been able to return to your normal activities? Yes.    Do you have any questions about your discharge instructions: Diet   No. Medications  No. Follow up visit  No.  Do you have questions or concerns about your Care? No.  Actions: * If pain score is 4 or above: No action needed, pain <4.

## 2016-06-19 MED FILL — ESCITALOPRAM 20 MG TABLET: 20 | 30 days supply | Qty: 30 | Fill #2

## 2016-06-19 MED FILL — ALPRAZolam 0.25 MG TABS: 0.25 | 30 days supply | Qty: 60 | Fill #0

## 2016-06-19 MED FILL — PROGESTERONE 100 MG CAPSULE: 100 | 30 days supply | Qty: 30 | Fill #0

## 2016-06-25 MED FILL — MINIVELLE 0.075 MG PATCH: 0.075 | 28 days supply | Qty: 8 | Fill #2

## 2016-07-13 ENCOUNTER — Ambulatory Visit (INDEPENDENT_AMBULATORY_CARE_PROVIDER_SITE_OTHER): Payer: 59 | Admitting: Cardiovascular Disease

## 2016-07-13 ENCOUNTER — Encounter: Payer: Self-pay | Admitting: Cardiovascular Disease

## 2016-07-13 VITALS — BP 158/106 | HR 81 | Ht 64.0 in | Wt 232.6 lb

## 2016-07-13 DIAGNOSIS — I10 Essential (primary) hypertension: Secondary | ICD-10-CM

## 2016-07-13 DIAGNOSIS — R001 Bradycardia, unspecified: Secondary | ICD-10-CM | POA: Diagnosis not present

## 2016-07-13 MED ORDER — AMLODIPINE BESYLATE 10 MG PO TABS: 10.0000 mg | ORAL_TABLET | Freq: Every day | ORAL | 3 refills | Status: DC

## 2016-07-13 NOTE — Progress Notes (Signed)
Cardiology Office Note   Date:  07/13/2016   ID:  Jodi Bates, DOB 11-20-1965, MRN [id number]  PCP:  Elby Showers, MD  Cardiologist:   [name], MD   No chief complaint on file.     History of Present Illness: Jodi Bates is a 51 y.o. female with asthma and hypertension who is being seen today for follow up.  She was seen 05/2016 for hypotension and near syncope as well as hypertension.  Jodi Bates was seen in the emergency department 01/2016 for episode of near-syncope. 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 She was seen in the emergency department and her heart rate was in the 40s. At the time she was taking nebivolol. This was discontinued and she has not had any recurrent episodes.  At her last appointment her BP was poorly controlled so furosemide was switched to HCTZ.  Since then her BP at home has ranged from 130-140/80/90.  She has been feeling well. She has not experienced any chest pain, shortness of breath, lower extremity edema, orthopnea, or PND.  She has not been getting much exercise lately and reports lack of energy. She's concerned that she is gaining weight. She watches her diet and does not add much salt.   Past Medical History:  Diagnosis Date  . Allergy   . Anxiety   . Asthma    prn inhalers  . GERD (gastroesophageal reflux disease)   . 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 Headache(784.0)    tension  . Hypertension    under control with meds., has been on med. > 5 yr.  . Iron deficiency anemia    no current problems or meds.    Past Surgical History:  Procedure Laterality Date  . BREAST SURGERY      lt breast/ benign cyst  . CYST EXCISION     back of head  . HYSTEROSCOPY W/D&C  06/25/2004  . INCISION AND DRAINAGE PERIRECTAL ABSCESS N/A 02/10/2013   Procedure: IRRIGATION AND DEBRIDEMENT PERIRECTAL ABSCESS;  Surgeon: Gwenyth Ober, MD;   Location: [place];  Service: General;  Laterality: N/A;  . LAPAROSCOPIC ENDOMETRIOSIS FULGURATION    . PILONIDAL CYST EXCISION N/A 04/28/2012   Procedure: exam under anesthesia and exicision of pilonidal;  Surgeon: Gwenyth Ober, MD;  Location: [place];  Service: General;  Laterality: N/A;  . POLYPECTOMY     uterine  . TUBAL LIGATION       Current Outpatient Prescriptions  Medication Sig Dispense Refill  . albuterol (PROVENTIL HFA;VENTOLIN HFA) 108 (90 Base) MCG/ACT inhaler Inhale 2 puffs into the lungs every 6 (six) hours as needed for wheezing or shortness of breath. 1 Inhaler 1  . albuterol (PROVENTIL) (2.5 MG/3ML) 0.083% nebulizer solution Take 3 mLs by nebulization every 6 (six) hours as needed for wheezing or shortness of breath.   1  . ALPRAZolam (XANAX) 0.25 MG tablet TAKE 1 TABLET BY MOUTH TWICE DAILY AS NEEDED 60 tablet 5  . amLODipine (NORVASC) 10 MG tablet Take 1 tablet (10 mg total) by mouth daily. 90 tablet 3  . budesonide (RHINOCORT ALLERGY) 32 MCG/ACT nasal spray Place 1 spray into both nostrils daily.    Marland Kitchen escitalopram (LEXAPRO) 20 MG tablet Take 20 mg by mouth at bedtime.   8  . fluticasone furoate-vilanterol (BREO ELLIPTA) 100-25 MCG/INH AEPB Inhale 1 puff into the lungs  daily. 1 each 5  . hydrochlorothiazide (MICROZIDE) 12.5 MG capsule Take 1 capsule (12.5 mg total) by mouth daily. 90 capsule 3  . loratadine (CLARITIN) 10 MG tablet Take 10 mg by mouth daily.    Marland Kitchen losartan (COZAAR) 100 MG tablet Take 1 tablet (100 mg total) by mouth daily. 90 tablet 3  . MINIVELLE 0.05 MG/24HR patch Place 1 patch onto the skin 2 (two) times a week. Pt changes patch on Monday and Thursday.  3  . Multiple Vitamin (MULTIVITAMIN WITH MINERALS) TABS tablet Take 1 tablet by mouth daily.    Marland Kitchen omeprazole (PRILOSEC) 40 MG capsule Take 1 capsule (40 mg total) by mouth daily before breakfast. 30 capsule 5  . progesterone (PROMETRIUM) 100 MG capsule Take 100 mg by mouth at bedtime.        Current Facility-Administered Medications  Medication Dose Route Frequency Provider Last Rate Last Dose  . 0.9 %  sodium chloride infusion  500 mL Intravenous Continuous Nandigam, Venia Minks, MD        Allergies:   Adhesive [tape]; Latex; Soap; and Other    Social History:  The patient  reports that she has never smoked. She has never used smokeless tobacco. She reports that she does not drink alcohol or use drugs.   Family History:  The patient's family history includes Allergic rhinitis in her mother and son; Asthma in her brother and son; Cancer in her father and maternal grandmother; Diabetes in her brother and mother; Esophageal cancer in her father; Heart failure in her mother; Hypertension in her mother; Pancreatic cancer in her brother; Sleep apnea in her brother.    ROS:  Please see the history of present illness.   Otherwise, review of systems are positive for headaches.   All other systems are reviewed and negative.    PHYSICAL EXAM: VS:  BP (!) 158/106   Pulse 81   Ht 5\' 4"  (1.626 m)   Wt 105.5 kg (232 lb 9.6 oz)   SpO2 97%   BMI 39.93 kg/m  , BMI Body mass index is 39.93 kg/m. GENERAL:  Well appearing.  No acute distress HEENT:  Pupils equal round and reactive, fundi not visualized, oral mucosa unremarkable NECK:  No jugular venous distention, waveform within normal limits, carotid upstroke brisk and symmetric, no bruits, no thyromegaly LUNGS:  Clear to auscultation bilaterally.  No crackles, wheezes or rhonci HEART:  RRR.  PMI not displaced or sustained,S1 and S2 within normal limits, no S3, no S4, no clicks, no rubs, no murmurs ABD:  Flat, positive bowel sounds normal in frequency in pitch, no bruits, no rebound, no guarding, no midline pulsatile mass, no hepatomegaly, no splenomegaly EXT:  2 plus pulses throughout, no edema, no cyanosis no clubbing SKIN:  No rashes no nodules NEURO:  Cranial nerves II through XII grossly intact, motor grossly intact  throughout PSYCH:  Cognitively intact, oriented to person place and time   EKG:  EKG is not ordered today. The ekg ordered 06/04/16 demonstrates sinus arrhythmia.  Rate 59 bpm.  Low voltage limb leads.  Cannot rule out prior septal infarct.    Recent Labs: 02/20/2016: BUN 11; Creatinine, Ser 0.87; Hemoglobin 12.8; Platelets 192; Potassium 3.6; Sodium 139    Lipid Panel    Component Value Date/Time   CHOL 184 03/25/2015 0905   TRIG 81 03/25/2015 0905   HDL 58 03/25/2015 0905   CHOLHDL 3.2 03/25/2015 0905   VLDL 16 03/25/2015 0905   LDLCALC 110 03/25/2015 0905  Wt Readings from Last 3 Encounters:  07/13/16 105.5 kg (232 lb 9.6 oz)  06/16/16 101.6 kg (224 lb)  06/04/16 101.8 kg (224 lb 6.4 oz)      ASSESSMENT AND PLAN:  # Hypertension: Blood pressure remains above goal.  Continue losartan and HCTZ.  We will increase amlodipine to 10 mg daily. If her BP remains elevated we will increase HCTZ to 25mg .  # Near syncope: # Bradycardia: No recurrent episodes since stopping nebivolol.  Avoid nodal agents as above.  # Obesity: Weight has increased from 224 to 232 in one month.  Recommended increasing her exercise back to at least 30-40 minutes most days of the week.   Current medicines are reviewed at length with the patient today.  The patient does not have concerns regarding medicines.  The following changes have been made:  Increase amlodipine  Labs/ tests ordered today include:  No orders of the defined types were placed in this encounter.    Disposition:   FU with Oluwatobi Visser C. Oval Linsey, MD, [facility] in 6 months   This note was written with the assistance of speech recognition software.  Please excuse any transcriptional errors.  Signed, Daxten Kovalenko C. Oval Linsey, MD, [facility]  07/13/2016 4:26 PM    [facility]

## 2016-07-13 NOTE — Patient Instructions (Signed)
Medication Instructions:  INCREASE YOUR AMLODIPINE TO 10 MG DAILY   Labwork: NONE  Testing/Procedures: NONE  Follow-Up: Your physician recommends that you schedule a follow-up appointment in: 3  MONTH OV  Any Other Special Instructions Will Be Listed Below (If Applicable). MONITOR YOUR BLOOD PRESSURE AND CALL THE OFFICE IF IT DOES NOT REMAIN BELOW 130/80  If you need a refill on your cardiac medications before your next appointment, please call your pharmacy.

## 2016-07-14 MED FILL — ESCITALOPRAM 20 MG TABLET: 20 | 30 days supply | Qty: 30 | Fill #3

## 2016-07-14 MED FILL — BREO ELLIPTA 100-25 MCG INH: 100-25 | 30 days supply | Qty: 60 | Fill #3

## 2016-07-14 MED FILL — PROGESTERONE 100 MG CAPSULE: 100 | 30 days supply | Qty: 30 | Fill #1

## 2016-07-24 MED FILL — MINIVELLE 0.075 MG PATCH: 0.075 | 28 days supply | Qty: 8 | Fill #3

## 2016-07-24 MED FILL — ALPRAZolam 0.25 MG TABS: 0.25 | 30 days supply | Qty: 60 | Fill #1

## 2016-07-31 MED FILL — AMLODIPINE BESYLATE 10 MG T: 10 | 90 days supply | Qty: 90 | Fill #0

## 2016-08-18 MED FILL — MINIVELLE 0.075 MG PATCH: 0.075 | 28 days supply | Qty: 8 | Fill #4

## 2016-08-18 MED FILL — BREO ELLIPTA 100-25 MCG INH: 100-25 | 30 days supply | Qty: 60 | Fill #4

## 2016-08-18 MED FILL — PROGESTERONE 100 MG CAPSULE: 100 | 30 days supply | Qty: 30 | Fill #2

## 2016-08-18 MED FILL — ESCITALOPRAM 20 MG TABLET: 20 | 30 days supply | Qty: 30 | Fill #4

## 2016-08-28 MED FILL — HYDROCHLOROTHIAZIDE 12.5 MG: 12.5 | 90 days supply | Qty: 90 | Fill #1

## 2016-08-31 MED FILL — ALPRAZolam 0.25 MG TABS: 0.25 | 30 days supply | Qty: 60 | Fill #2

## 2016-08-31 MED FILL — LOSARTAN POTASSIUM 100 MG T: 100 | 90 days supply | Qty: 90 | Fill #1

## 2016-09-08 MED FILL — OMEPRAZOLE DR 40 MG CAPSULE: 40 | 90 days supply | Qty: 90 | Fill #1

## 2016-09-18 MED FILL — PROGESTERONE 100 MG CAPSULE: 100 | 30 days supply | Qty: 30 | Fill #3

## 2016-09-18 MED FILL — BREO ELLIPTA 100-25 MCG INH: 100-25 | 30 days supply | Qty: 60 | Fill #5

## 2016-09-18 MED FILL — ESCITALOPRAM 20 MG TABLET: 20 | 30 days supply | Qty: 30 | Fill #5

## 2016-09-18 MED FILL — MINIVELLE 0.075 MG PATCH: 0.075 | 28 days supply | Qty: 8 | Fill #5

## 2016-10-13 ENCOUNTER — Encounter: Payer: Self-pay | Admitting: Cardiovascular Disease

## 2016-10-13 ENCOUNTER — Ambulatory Visit (INDEPENDENT_AMBULATORY_CARE_PROVIDER_SITE_OTHER): Payer: 59 | Admitting: Cardiovascular Disease

## 2016-10-13 VITALS — BP 136/88 | HR 53 | Ht 64.0 in | Wt 228.2 lb

## 2016-10-13 DIAGNOSIS — R001 Bradycardia, unspecified: Secondary | ICD-10-CM | POA: Diagnosis not present

## 2016-10-13 DIAGNOSIS — I10 Essential (primary) hypertension: Secondary | ICD-10-CM | POA: Diagnosis not present

## 2016-10-13 DIAGNOSIS — R55 Syncope and collapse: Secondary | ICD-10-CM | POA: Diagnosis not present

## 2016-10-13 NOTE — Patient Instructions (Signed)
Medication Instructions:  START TAKING YOUR AMLODIPINE AT BEDTIME  Labwork: NONE  Testing/Procedures: NONE  Follow-Up: AS NEEDED

## 2016-10-13 NOTE — Progress Notes (Signed)
Cardiology Office Note   Date:  10/13/2016   ID:  Jodi Bates, DOB 1965-12-03, MRN [id number]  PCP:  Elby Showers, MD  Cardiologist:   Jodi Latch, MD   No chief complaint on file.     History of Present Illness: Jodi Bates is a 51 y.o. female with asthma and hypertension who is being seen today for follow up.  She was seen 05/2016 for bradycardia and near syncope as well as hypertension.  Jodi Bates was seen in the emergency department 01/2016 for episode of near-syncope. 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 She was seen in the emergency department and her heart rate was in the 40s. At the time she was taking nebivolol. This was discontinued and she has not had any recurrent episodes.  She has struggled with poorly-controlled hypertension, so furosemide was switched to HCTZ.  At her last appointment her blood pressure was above goal so amlodipine was increased to 10 mg daily.  Since her last appointment Jodi Bates has been feeling well.  She denies any chest pain or shortness of breath.  She initially had increased ankle edema but this has improved.  She denies orthopnea or PND. She brings a log of her blood pressures that show areas range from 118-137 over 70s to 80s. His been mostly in the 110s to 120s. She notes that the higher dose of amlodipine makes her little bit sleepy and she has had to adjust the time she takes it.  She has started exercising 2-3 times per week.  She has also been meditating and working on her diet.  She lost 4lb since her last appointment.  Ms. Jodi Bates has been a little stressed lately because her adult son who suffered from head trauma after a MVC recently moved back in with her and her husband.   Past Medical History:  Diagnosis Date  . Allergy   . Anxiety   . Asthma    prn inhalers  . GERD (gastroesophageal reflux disease)   . Gluteal cleft wound 03/2012   recurrent  gluteal cleft infections  . Headache(784.0)    tension  . Hypertension    under control with meds., has been on med. > 5 yr.  . Iron deficiency anemia    no current problems or meds.    Past Surgical History:  Procedure Laterality Date  . BREAST SURGERY      lt breast/ benign cyst  . CYST EXCISION     back of head  . HYSTEROSCOPY W/D&C  06/25/2004  . INCISION AND DRAINAGE PERIRECTAL ABSCESS N/A 02/10/2013   Procedure: IRRIGATION AND DEBRIDEMENT PERIRECTAL ABSCESS;  Surgeon: Gwenyth Ober, MD;  Location: [place];  Service: General;  Laterality: N/A;  . LAPAROSCOPIC ENDOMETRIOSIS FULGURATION    . PILONIDAL CYST EXCISION N/A 04/28/2012   Procedure: exam under anesthesia and exicision of pilonidal;  Surgeon: Gwenyth Ober, MD;  Location: [place];  Service: General;  Laterality: N/A;  . POLYPECTOMY     uterine  . TUBAL LIGATION       Current Outpatient Prescriptions  Medication Sig Dispense Refill  . albuterol (PROVENTIL HFA;VENTOLIN HFA) 108 (90 Base) MCG/ACT inhaler Inhale 2 puffs into the lungs every 6 (six) hours as needed for wheezing or shortness of breath. 1 Inhaler 1  . albuterol (PROVENTIL) (2.5 MG/3ML) 0.083% nebulizer solution Take 3 mLs by nebulization every 6 (six) hours as  needed for wheezing or shortness of breath.   1  . ALPRAZolam (XANAX) 0.25 MG tablet TAKE 1 TABLET BY MOUTH TWICE DAILY AS NEEDED 60 tablet 5  . amLODipine (NORVASC) 10 MG tablet Take 10 mg by mouth at bedtime.    . budesonide (RHINOCORT ALLERGY) 32 MCG/ACT nasal spray Place 1 spray into both nostrils daily.    Marland Kitchen escitalopram (LEXAPRO) 20 MG tablet Take 20 mg by mouth at bedtime.   8  . fluticasone (FLONASE) 50 MCG/ACT nasal spray Place 1 spray into both nostrils daily.    . fluticasone furoate-vilanterol (BREO ELLIPTA) 100-25 MCG/INH AEPB Inhale 1 puff into the lungs daily. 1 each 5  . loratadine (CLARITIN) 10 MG tablet Take 10 mg by mouth daily.    Marland Kitchen losartan (COZAAR) 100 MG tablet Take  1 tablet (100 mg total) by mouth daily. 90 tablet 3  . MINIVELLE 0.05 MG/24HR patch Place 1 patch onto the skin 2 (two) times a week. Pt changes patch on Monday and Thursday.  3  . Multiple Vitamin (MULTIVITAMIN WITH MINERALS) TABS tablet Take 1 tablet by mouth daily.    Marland Kitchen omeprazole (PRILOSEC) 40 MG capsule Take 1 capsule (40 mg total) by mouth daily before breakfast. 30 capsule 5  . progesterone (PROMETRIUM) 100 MG capsule Take 100 mg by mouth at bedtime.     . hydrochlorothiazide (MICROZIDE) 12.5 MG capsule Take 1 capsule (12.5 mg total) by mouth daily. 90 capsule 3   Current Facility-Administered Medications  Medication Dose Route Frequency Provider Last Rate Last Dose  . 0.9 %  sodium chloride infusion  500 mL Intravenous Continuous Nandigam, Venia Minks, MD        Allergies:   Adhesive [tape]; Latex; Soap; and Other    Social History:  The patient  reports that she has never smoked. She has never used smokeless tobacco. She reports that she does not drink alcohol or use drugs.   Family History:  The patient's family history includes Allergic rhinitis in her mother and son; Asthma in her brother and son; Cancer in her father and maternal grandmother; Diabetes in her brother and mother; Esophageal cancer in her father; Heart failure in her mother; Hypertension in her mother; Pancreatic cancer in her brother; Sleep apnea in her brother.    ROS:  Please see the history of present illness.   Otherwise, review of systems are positive for headaches.   All other systems are reviewed and negative.    PHYSICAL EXAM: VS:  BP 136/88   Pulse (!) 53   Ht 5\' 4"  (1.626 m)   Wt 103.5 kg (228 lb 3.2 oz)   SpO2 99%   BMI 39.17 kg/m  , BMI Body mass index is 39.17 kg/m. GENERAL:  Well appearing HEENT: Pupils equal round and reactive, fundi not visualized, oral mucosa unremarkable NECK:  No jugular venous distention, waveform within normal limits, carotid upstroke brisk and symmetric, no bruits, no  thyromegaly LUNGS:  Clear to auscultation bilaterally HEART:  RRR.  PMI not displaced or sustained,S1 and S2 within normal limits, no S3, no S4, no clicks, no rubs, no murmurs ABD:  Flat, positive bowel sounds normal in frequency in pitch, no bruits, no rebound, no guarding, no midline pulsatile mass, no hepatomegaly, no splenomegaly EXT:  2 plus pulses throughout, no edema, no cyanosis no clubbing SKIN:  No rashes no nodules NEURO:  Cranial nerves II through XII grossly intact, motor grossly intact throughout PSYCH:  Cognitively intact, oriented to person place  and time   EKG:  EKG is not ordered today. The ekg ordered 06/04/16 demonstrates sinus arrhythmia.  Rate 59 bpm.  Low voltage limb leads.  Cannot rule out prior septal infarct.    Recent Labs: 02/20/2016: BUN 11; Creatinine, Ser 0.87; Hemoglobin 12.8; Platelets 192; Potassium 3.6; Sodium 139    Lipid Panel    Component Value Date/Time   CHOL 184 03/25/2015 0905   TRIG 81 03/25/2015 0905   HDL 58 03/25/2015 0905   CHOLHDL 3.2 03/25/2015 0905   VLDL 16 03/25/2015 0905   LDLCALC 110 03/25/2015 0905      Wt Readings from Last 3 Encounters:  10/13/16 103.5 kg (228 lb 3.2 oz)  07/13/16 105.5 kg (232 lb 9.6 oz)  06/16/16 101.6 kg (224 lb)      ASSESSMENT AND PLAN:  # Hypertension: Blood pressure was elevated today but has been well-controlled at home when she takes her medication.  She will start taking amlodipine at night since it makes her sleepy.  Continue HCTZ and losartan.  # Near syncope: # Bradycardia: Stable.  No recurrent episodes since stopping nebivolol.  Avoid beta blockers.   # Obesity: Ms. Mcmackin was congratulated on losing 4lb since her last appointment.  She was encouraged to keep up her weight loss efforts.     Current medicines are reviewed at length with the patient today.  The patient does not have concerns regarding medicines.  The following changes have been made:  Increase amlodipine  Labs/  tests ordered today include:  No orders of the defined types were placed in this encounter.    Disposition:   FU with Daneil Beem C. Oval Linsey, MD, [facility] as needed.    This note was written with the assistance of speech recognition software.  Please excuse any transcriptional errors.  Signed, Omarian Jaquith C. Oval Linsey, MD, [facility]  10/13/2016 1:36 PM    [facility]

## 2016-10-15 MED FILL — PROGESTERONE 100 MG CAPSULE: 100 | 30 days supply | Qty: 30 | Fill #4

## 2016-10-15 MED FILL — ESCITALOPRAM 20 MG TABLET: 20 | 30 days supply | Qty: 30 | Fill #6

## 2016-10-15 MED FILL — BREO ELLIPTA 100-25 MCG INH: 100-25 | 30 days supply | Qty: 60 | Fill #1

## 2016-10-16 MED FILL — MINIVELLE 0.075 MG PATCH: 0.075 | 28 days supply | Qty: 8 | Fill #6

## 2016-10-29 MED FILL — AMLODIPINE BESYLATE 10 MG T: 10 | 90 days supply | Qty: 90 | Fill #1

## 2016-11-08 ENCOUNTER — Ambulatory Visit (HOSPITAL_COMMUNITY)
Admission: EM | Admit: 2016-11-08 | Discharge: 2016-11-08 | Disposition: A | Discharge status: Home / Self Care | Payer: 59 | Attending: Family Medicine | Admitting: Family Medicine

## 2016-11-08 ENCOUNTER — Encounter (HOSPITAL_COMMUNITY): Payer: Self-pay | Admitting: Emergency Medicine

## 2016-11-08 DIAGNOSIS — M5440 Lumbago with sciatica, unspecified side: Secondary | ICD-10-CM

## 2016-11-08 LAB — POCT URINALYSIS DIP (DEVICE)
Bilirubin Urine: NEGATIVE
GLUCOSE, UA: NEGATIVE mg/dL
Hgb urine dipstick: NEGATIVE
Ketones, ur: NEGATIVE mg/dL
LEUKOCYTES UA: NEGATIVE
NITRITE: NEGATIVE
PROTEIN: NEGATIVE mg/dL
Specific Gravity, Urine: 1.015 (ref 1.005–1.030)
UROBILINOGEN UA: 0.2 mg/dL (ref 0.0–1.0)
pH: 6 (ref 5.0–8.0)

## 2016-11-08 MED ORDER — PREDNISONE 20 MG PO TABS
ORAL_TABLET | ORAL | 0 refills | Status: DC
Start: 2016-11-08 — End: 2017-03-02

## 2016-11-08 MED ORDER — HYDROCODONE-ACETAMINOPHEN 5-325 MG PO TABS: 1.0000 | ORAL_TABLET | Freq: Four times a day (QID) | ORAL | 0 refills | Status: DC | PRN

## 2016-11-08 NOTE — ED Provider Notes (Signed)
[place]   [id number] 11/08/16 Arrival Time: 1804   SUBJECTIVE:  Jodi Bates is a 51 y.o. female who presents to the urgent care with complaint of lower back pain onset 14 days   Pain increases w/activity but is present whether she is lying or standing. She actually thinks that the pain is less when she's standing. She has not had this kind of pain before and she can remember no accidents or incidents scipitated the pain.  Denies inj/trauma, urinary sx, numbness/tingly of LE, and no weakness in the lower extremities  Patient works as an Restaurant manager, fast food.     Past Medical History:  Diagnosis Date  . Allergy   . Anxiety   . Asthma    prn inhalers  . GERD (gastroesophageal reflux disease)   . 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 Headache(784.0)    tension  . Hypertension    under control with meds., has been on med. > 5 yr.  . Iron deficiency anemia    no current problems or meds.   Family History  Problem Relation Age of Onset  . Diabetes Mother   . Hypertension Mother   . Allergic rhinitis Mother   . Heart failure Mother   . Cancer Father        deceased metastaic throat cancer  . Esophageal cancer Father   . Diabetes Brother   . Pancreatic cancer Brother   . Asthma Brother   . Sleep apnea Brother   . Cancer Maternal Grandmother        breast  . Allergic rhinitis Son   . Asthma Son   . Colon cancer Neg Hx   . Rectal cancer Neg Hx   . Stomach cancer Neg Hx   . Colon polyps Neg Hx    Social History   Social History  . Marital status: Married    Spouse name: N/A  . Number of children: N/A  . Years of education: N/A   Occupational History  . Not on file.   Social History Main Topics  . Smoking status: Never Smoker  . Smokeless tobacco: Never Used  . Alcohol use No  . Drug use: No  . Sexual activity: Yes    Partners: Male    Birth control/ protection: None   Other Topics Concern  . Not on  file   Social History Narrative  . No narrative on file   Current Facility-Administered Medications for the 11/08/16 encounter [facility] Encounter)  Medication  . 0.9 %  sodium chloride infusion   Current Meds  Medication Sig  . amLODipine (NORVASC) 10 MG tablet Take 10 mg by mouth at bedtime.  Marland Kitchen escitalopram (LEXAPRO) 20 MG tablet Take 20 mg by mouth at bedtime.   Marland Kitchen loratadine (CLARITIN) 10 MG tablet Take 10 mg by mouth daily.  Marland Kitchen losartan (COZAAR) 100 MG tablet Take 1 tablet (100 mg total) by mouth daily.  Marland Kitchen MINIVELLE 0.05 MG/24HR patch Place 1 patch onto the skin 2 (two) times a week. Pt changes patch on Monday and Thursday.  . Multiple Vitamin (MULTIVITAMIN WITH MINERALS) TABS tablet Take 1 tablet by mouth daily.  Marland Kitchen omeprazole (PRILOSEC) 40 MG capsule Take 1 capsule (40 mg total) by mouth daily before breakfast.  . progesterone (PROMETRIUM) 100 MG capsule Take 100 mg by mouth at bedtime.    Allergies  Allergen Reactions  . Adhesive [Tape] Itching and Other (See Comments)    Reaction:  Blisters   .  Latex Itching and Rash  . Soap Other (See Comments)    Soaps that contain any kind of fragrance triggers an asthma attack.    . Other     Fragrances       ROS: As per HPI, remainder of ROS negative.   OBJECTIVE:   Vitals:   11/08/16 1814  BP: (!) 153/102  Pulse: 64  Resp: 20  Temp: 98.4 F (36.9 C)  TempSrc: Oral  SpO2: 98%     General appearance: alert; no distress but the patient does seem to be uncomfortable Eyes: PERRL; EOMI; conjunctiva normal HENT: normocephalic; atraumatic;  external ears normal without trauma; nasal mucosa normal; oral mucosa normal Neck: supple Abdomen: soft, mild tenderness diffusely; bowel sounds normal; no masses or organomegaly; no guarding or rebound tenderness Back:  CVA tenderness, mild, on the right Extremities: no cyanosis or edema; symmetrical with no gross deformities Skin: warm and dry Neurologic: normal gait; grossly  normal Psychological: alert and cooperative; normal mood and affect      Labs:  Results for orders placed or performed during the hospital encounter of 11/08/16  POCT urinalysis dip (device)  Result Value Ref Range   Glucose, UA NEGATIVE NEGATIVE mg/dL   Bilirubin Urine NEGATIVE NEGATIVE   Ketones, ur NEGATIVE NEGATIVE mg/dL   Specific Gravity, Urine 1.015 1.005 - 1.030   Hgb urine dipstick NEGATIVE NEGATIVE   pH 6.0 5.0 - 8.0   Protein, ur NEGATIVE NEGATIVE mg/dL   Urobilinogen, UA 0.2 0.0 - 1.0 mg/dL   Nitrite NEGATIVE NEGATIVE   Leukocytes, UA NEGATIVE NEGATIVE    Labs Reviewed  POCT URINALYSIS DIP (DEVICE)    No results found.     ASSESSMENT & PLAN:  1. Bilateral low back pain with sciatica, sciatica laterality unspecified, unspecified chronicity     Meds ordered this encounter  Medications  . predniSONE (DELTASONE) 20 MG tablet    Sig: Two daily with food    Dispense:  10 tablet    Refill:  0  . HYDROcodone-acetaminophen (NORCO) 5-325 MG tablet    Sig: Take 1 tablet by mouth every 6 (six) hours as needed for moderate pain.    Dispense:  12 tablet    Refill:  0    Reviewed expectations re: course of current medical issues. Questions answered. Outlined signs and symptoms indicating need for more acute intervention. Patient verbalized understanding. After Visit Summary given.    Procedures:      Robyn Haber, MD 11/08/16 1858

## 2016-11-08 NOTE — ED Triage Notes (Signed)
Pt c/o lower back pain onset 14 days   Pain increases w/activity  Denies inj/trauma, urinary sx, numbness/tingly of LE  A&O x4... NAD... Ambulatory

## 2016-11-08 NOTE — Discharge Instructions (Signed)
The urine test is negative, and the history does not suggest a serious problem.  Your degree of pain is concerning, however, and may represent a swollen disc.  Keep you appointment on Tuesday, since you may need further imaging.  Use ibuprofen for pain during the day if you are working

## 2016-11-10 ENCOUNTER — Ambulatory Visit (INDEPENDENT_AMBULATORY_CARE_PROVIDER_SITE_OTHER): Payer: 59 | Admitting: Internal Medicine

## 2016-11-10 ENCOUNTER — Encounter: Payer: Self-pay | Admitting: Internal Medicine

## 2016-11-10 ENCOUNTER — Ambulatory Visit
Admission: RE | Admit: 2016-11-10 | Discharge: 2016-11-10 | Disposition: A | Discharge status: Home / Self Care | Payer: 59 | Source: Ambulatory Visit | Attending: Internal Medicine | Admitting: Internal Medicine

## 2016-11-10 VITALS — BP 138/96 | HR 76 | Temp 98.4°F | Wt 233.0 lb

## 2016-11-10 DIAGNOSIS — R0781 Pleurodynia: Secondary | ICD-10-CM | POA: Diagnosis not present

## 2016-11-10 DIAGNOSIS — I10 Essential (primary) hypertension: Secondary | ICD-10-CM

## 2016-11-10 DIAGNOSIS — R0789 Other chest pain: Secondary | ICD-10-CM | POA: Diagnosis not present

## 2016-11-10 DIAGNOSIS — M549 Dorsalgia, unspecified: Secondary | ICD-10-CM | POA: Diagnosis not present

## 2016-11-10 MED ORDER — CYCLOBENZAPRINE HCL 10 MG PO TABS: 10.0000 mg | ORAL_TABLET | Freq: Three times a day (TID) | ORAL | 1 refills | Status: DC | PRN

## 2016-11-10 MED FILL — CYCLOBENZAPRINE 10 MG TAB: 10 | 10 days supply | Qty: 30 | Fill #0

## 2016-11-10 NOTE — Progress Notes (Signed)
   Subjective:    Patient ID: Jodi Bates, female    DOB: [birth year], 51 y.o.   MRN: [id number]  HPI Patient here today with extreme back pain for several days.  She went to [facility] urgent care center on October 14.  She saw Dr. Joseph Art who placed her on a short course of prednisone 40 mg daily for 5 days.  She was also given hydrocodone APAP 5/325 number 12 tablets for pain.  Still having pain.  She has a history of hypertension and blood pressure is elevated today at 138/96 but she is having extreme pain.  She denies falling or injuring her back or side.  She now has a left-sided pain along one rib.  No cough or recent URI.  Left rib detail films show no acute abnormality.  Offered referral to physical therapy but she declined.  Flu vaccine given through employment.    Review of Systems see above     Objective:   Physical Exam Is difficult for her to sit still due to pain.  She has mid left lower rib pain with palpation.  Straight leg raising is negative today at 90 degrees.  Muscle strength is normal and lower extremities.  Deep tendon reflexes 2+ and symmetrical in her knees.       Assessment & Plan:  It seems that her initial presentation was more like sciatica but now she has left-sided chest wall pain with negative x-ray.  Plan: Flexeril 10 mg up to 3 times daily as needed for pain.  Continue with previous pain medication prescription.  In for tapering course of prednisone going from 60 mg and 0 mg over 7 days.  Advised patient to be out of work but she says coworkers are on vacation and she needs to be at work.

## 2016-11-12 MED FILL — PROGESTERONE 100 MG CAPSULE: 100 | 30 days supply | Qty: 30 | Fill #5

## 2016-11-12 MED FILL — BREO ELLIPTA 100-25 MCG INH: 100-25 | 30 days supply | Qty: 60 | Fill #2

## 2016-11-12 MED FILL — predniSONE 10 MG TABS: 10 | 6 days supply | Qty: 21 | Fill #0

## 2016-11-12 MED FILL — MINIVELLE 0.075 MG PATCH: 0.075 | 28 days supply | Qty: 8 | Fill #7

## 2016-11-12 MED FILL — ESCITALOPRAM 20 MG TABLET: 20 | 30 days supply | Qty: 30 | Fill #7

## 2016-11-15 NOTE — Patient Instructions (Signed)
Take prednisone and tapering course as directed and continue with pain medication.  Flexeril up to 3 times daily.  If you change your mind, we will be happy to give you an excuse for work.  If you do not improve we will probably suggest physical therapy.

## 2016-12-02 MED FILL — HYDROCHLOROTHIAZIDE 12.5 MG: 12.5 | 90 days supply | Qty: 90 | Fill #2

## 2016-12-02 MED FILL — LOSARTAN POTASSIUM 100 MG T: 100 | 90 days supply | Qty: 90 | Fill #2

## 2016-12-10 MED FILL — MINIVELLE 0.075 MG PATCH: 0.075 | 28 days supply | Qty: 8 | Fill #8

## 2016-12-10 MED FILL — PROGESTERONE 100 MG CAPSULE: 100 | 30 days supply | Qty: 30 | Fill #6

## 2016-12-14 ENCOUNTER — Other Ambulatory Visit: Payer: Self-pay | Admitting: Allergy and Immunology

## 2016-12-14 MED FILL — BREO ELLIPTA 100-25 MCG INH: 100-25 | 30 days supply | Qty: 60 | Fill #3

## 2016-12-14 MED FILL — ESCITALOPRAM 20 MG TABLET: 20 | 30 days supply | Qty: 30 | Fill #8

## 2017-01-01 MED FILL — ESTRADIOL 0.075 MG/24HR PTT: 0.075 | 28 days supply | Qty: 8 | Fill #9

## 2017-01-04 MED FILL — PROGESTERONE 100 MG CAPSULE: 100 | 30 days supply | Qty: 30 | Fill #7

## 2017-01-25 MED FILL — AMLODIPINE BESYLATE 10 MG T: 10 | 90 days supply | Qty: 90 | Fill #2

## 2017-01-25 MED FILL — ESCITALOPRAM 20 MG TABLET: 20 | 30 days supply | Qty: 30 | Fill #9

## 2017-01-25 MED FILL — BREO ELLIPTA 100-25 MCG INH: 100-25 | 30 days supply | Qty: 60 | Fill #4

## 2017-02-08 MED FILL — PROGESTERONE 100 MG CAPSULE: 100 | 30 days supply | Qty: 30 | Fill #8

## 2017-02-08 MED FILL — ESTRADIOL 0.075 MG/24HR PTT: 0.075 | 28 days supply | Qty: 8 | Fill #10

## 2017-02-16 ENCOUNTER — Other Ambulatory Visit: Payer: Self-pay | Admitting: Internal Medicine

## 2017-02-16 DIAGNOSIS — Z139 Encounter for screening, unspecified: Secondary | ICD-10-CM

## 2017-02-22 MED FILL — BREO ELLIPTA 100-25 MCG INH: 100-25 | 30 days supply | Qty: 60 | Fill #5

## 2017-02-22 MED FILL — ESCITALOPRAM 20 MG TABLET: 20 | 30 days supply | Qty: 30 | Fill #10

## 2017-03-02 ENCOUNTER — Telehealth: Payer: Self-pay

## 2017-03-02 ENCOUNTER — Ambulatory Visit: Payer: 59 | Admitting: Internal Medicine

## 2017-03-02 ENCOUNTER — Ambulatory Visit
Admission: RE | Admit: 2017-03-02 | Discharge: 2017-03-02 | Disposition: A | Discharge status: Home / Self Care | Payer: 59 | Source: Ambulatory Visit | Attending: Internal Medicine | Admitting: Internal Medicine

## 2017-03-02 ENCOUNTER — Encounter: Payer: Self-pay | Admitting: Internal Medicine

## 2017-03-02 VITALS — BP 140/90 | HR 74 | Ht 64.0 in | Wt 241.0 lb

## 2017-03-02 DIAGNOSIS — F419 Anxiety disorder, unspecified: Secondary | ICD-10-CM

## 2017-03-02 DIAGNOSIS — F329 Major depressive disorder, single episode, unspecified: Secondary | ICD-10-CM

## 2017-03-02 DIAGNOSIS — F32A Depression, unspecified: Secondary | ICD-10-CM

## 2017-03-02 DIAGNOSIS — M79641 Pain in right hand: Secondary | ICD-10-CM

## 2017-03-02 DIAGNOSIS — F439 Reaction to severe stress, unspecified: Secondary | ICD-10-CM | POA: Diagnosis not present

## 2017-03-02 DIAGNOSIS — I10 Essential (primary) hypertension: Secondary | ICD-10-CM

## 2017-03-02 DIAGNOSIS — S6991XA Unspecified injury of right wrist, hand and finger(s), initial encounter: Secondary | ICD-10-CM | POA: Diagnosis not present

## 2017-03-02 DIAGNOSIS — S60221A Contusion of right hand, initial encounter: Secondary | ICD-10-CM | POA: Diagnosis not present

## 2017-03-02 DIAGNOSIS — M7989 Other specified soft tissue disorders: Secondary | ICD-10-CM | POA: Diagnosis not present

## 2017-03-02 MED ORDER — ALPRAZOLAM 0.5 MG PO TABS: 0.5000 mg | ORAL_TABLET | Freq: Three times a day (TID) | ORAL | 2 refills | Status: DC

## 2017-03-02 MED FILL — ALPRAZolam 0.5 MG TABS: 0.5 | 30 days supply | Qty: 90 | Fill #0

## 2017-03-02 NOTE — Telephone Encounter (Signed)
Have her go get Xrayed then come to office. We do ot order Xrays without office visit. Please order hand xray which hand?

## 2017-03-02 NOTE — Addendum Note (Signed)
Addended by: Mady Haagensen on: 03/02/2017 03:17 PM   Modules accepted: Orders

## 2017-03-02 NOTE — Telephone Encounter (Signed)
Araceli called patient back to verify that it was her right hand.  Order for x-ray was put into Epic and appointment was given for 2:30 today.

## 2017-03-02 NOTE — Telephone Encounter (Signed)
Patient called states she "punched something" last night and now her hand is swollen and hurting and can't make a fist. She would like to know if you order an xray for her?

## 2017-03-03 MED FILL — ESTRADIOL 0.075 MG/24HR PTT: 0.075 | 28 days supply | Qty: 8 | Fill #11

## 2017-03-03 NOTE — Progress Notes (Signed)
   Subjective:    Patient ID: Jodi Bates, female    DOB: 27-Jun-1965, 52 y.o.   MRN: [id number]  HPI Patient in today with pain and swelling right hand.  She injured it last evening.  X-ray is pending.  Has noticed swelling between first and second metacarpals.  Painful to close her hand.  Situational stress.  Mother has been hospitalized in [place] recently with hyperglycemia and mental confusion.  Wants to go home and live alone.  2 brothers live in [place] but they have dependent on patient to persuade mother to go to rehab facility.  Mother does not want to go home.  Also, her son is living with patient and her husband at present time.  He was hospitalized with a head injury after a motor vehicle accident for some time.  He is applying for disability but has been turned down.  An appeal is in process.  He is not driving at the present time.  Brother died of pancreatic cancer in 2016/04/25.  She has seen Dr. Oval Linsey in May 2018 for near syncope and hypertension.  She is since had follow-up visits with Dr. Oval Linsey in June and September.  Blood pressure tends to be elevated in the office but with home blood pressure reading seems to be under good control.  She is on HCTZ, losartan and amlodipine.    Review of Systems see above-upset over situational stress which was discussed at length     Objective:   Physical Exam  Spent 20 minutes speaking with her about issues regarding family stress.  Has mild swelling and tenderness between first and second metacarpals right hand.  She can close her hand but it is painful.  No obvious dislocations.      Assessment & Plan:  Anxiety with feeling of being overwhelmed  Contusion right hand-x-ray reveals no fractures  Essential hypertension-blood pressure 140/90- continue to monitor  Plan: Patient does not want to be out of work at this time.  Prescribe Xanax 0.5 mg up to 3 times daily as needed for anxiety.

## 2017-03-03 NOTE — Patient Instructions (Signed)
Xanax 0.5 mg up to 3 times daily as needed for anxiety.  Apply ice and may take Tylenol or Advil for pain right hand.

## 2017-03-04 MED FILL — PROGESTERONE 100 MG CAPSULE: 100 | 30 days supply | Qty: 30 | Fill #9

## 2017-03-08 MED FILL — LOSARTAN POTASSIUM 100 MG T: 100 | 90 days supply | Qty: 90 | Fill #3

## 2017-03-08 MED FILL — HYDROCHLOROTHIAZIDE 12.5 MG: 12.5 | 90 days supply | Qty: 90 | Fill #3

## 2017-03-09 ENCOUNTER — Ambulatory Visit
Admission: RE | Admit: 2017-03-09 | Discharge: 2017-03-09 | Disposition: A | Discharge status: Home / Self Care | Payer: 59 | Source: Ambulatory Visit | Attending: Internal Medicine | Admitting: Internal Medicine

## 2017-03-09 DIAGNOSIS — Z1231 Encounter for screening mammogram for malignant neoplasm of breast: Secondary | ICD-10-CM | POA: Diagnosis not present

## 2017-03-09 DIAGNOSIS — H40013 Open angle with borderline findings, low risk, bilateral: Secondary | ICD-10-CM | POA: Diagnosis not present

## 2017-03-09 DIAGNOSIS — Z139 Encounter for screening, unspecified: Secondary | ICD-10-CM

## 2017-03-09 DIAGNOSIS — H04123 Dry eye syndrome of bilateral lacrimal glands: Secondary | ICD-10-CM | POA: Diagnosis not present

## 2017-03-25 ENCOUNTER — Other Ambulatory Visit: Payer: Self-pay | Admitting: Allergy and Immunology

## 2017-03-25 MED FILL — ESTRADIOL 0.075 MG/24HR PTT: 0.075 | 28 days supply | Qty: 8 | Fill #0

## 2017-03-26 MED FILL — ESCITALOPRAM 20 MG TABLET: 20 | 30 days supply | Qty: 30 | Fill #11

## 2017-04-09 ENCOUNTER — Encounter: Payer: Self-pay | Admitting: *Deleted

## 2017-04-09 ENCOUNTER — Telehealth: Payer: Self-pay | Admitting: *Deleted

## 2017-04-09 ENCOUNTER — Telehealth (INDEPENDENT_AMBULATORY_CARE_PROVIDER_SITE_OTHER): Payer: 59 | Admitting: *Deleted

## 2017-04-09 DIAGNOSIS — R42 Dizziness and giddiness: Secondary | ICD-10-CM

## 2017-04-09 DIAGNOSIS — R000 Tachycardia, unspecified: Secondary | ICD-10-CM

## 2017-04-09 NOTE — Telephone Encounter (Signed)
This encounter was created in error - please disregard.

## 2017-04-09 NOTE — Telephone Encounter (Signed)
Received a call from patient and she is c/o fast HR 124,  feeling jittery and dizzy. She is an Glass blower/designer at [employer]. Called and spoke with [facility] triage nurse to see about getting an EKG on patient. Altha Harm stated she would go speak with patient. Patient aware.

## 2017-04-09 NOTE — Telephone Encounter (Signed)
See other phone note ./cy 

## 2017-04-09 NOTE — Telephone Encounter (Signed)
Pt called Dr Blenda Mounts nurse Rip Harbour) at [facility] office with complaints of elevated heart rate ,dizziness, and feeling jittery EKG done with heart rate running 84  B/p was 176/111.Per pt did not take Losartan this am only took Amlodipine. Also pt has taken today and yesterday Tylenol sinus med Disucssed with Dr Tito Dine looks okay B/p elevated per Dr Acie Fredrickson does not feel this is cardiac related. Pt does note pressure to ear may be causing dizziness Per Dr Acie Fredrickson may go home and rest and to take Losartan and if needs to may take an extra HCTZ if consumes salty foods for B/P.

## 2017-04-12 ENCOUNTER — Telehealth: Payer: Self-pay | Admitting: Internal Medicine

## 2017-04-12 NOTE — Telephone Encounter (Signed)
Patient called the office this morning stating that she called Dr. Mickie Hillier office to be seen for Vertigo.  They need a referral from our office in order to make an appointment for her.  Patient has been seen in the ED previously for dizziness.  Spoke with Dr. Renold Genta regarding this.  She states patient can make her own appointment.  Advised that we have to make the referral.  Dr. Renold Genta advised to make the referral.    Spoke with Dr. Pollie Friar office.  She advised to fax over the information to their office at [phone] and they will reach out to the patient to schedule.  Faxed information.

## 2017-04-20 DIAGNOSIS — H6993 Unspecified Eustachian tube disorder, bilateral: Secondary | ICD-10-CM | POA: Diagnosis not present

## 2017-04-20 DIAGNOSIS — J300 Vasomotor rhinitis: Secondary | ICD-10-CM | POA: Diagnosis not present

## 2017-04-27 ENCOUNTER — Encounter: Payer: Self-pay | Admitting: Allergy and Immunology

## 2017-04-27 ENCOUNTER — Ambulatory Visit: Payer: 59 | Admitting: Allergy and Immunology

## 2017-04-27 VITALS — BP 142/96 | HR 76 | Resp 16

## 2017-04-27 DIAGNOSIS — J4540 Moderate persistent asthma, uncomplicated: Secondary | ICD-10-CM | POA: Diagnosis not present

## 2017-04-27 DIAGNOSIS — J3089 Other allergic rhinitis: Secondary | ICD-10-CM

## 2017-04-27 DIAGNOSIS — K219 Gastro-esophageal reflux disease without esophagitis: Secondary | ICD-10-CM | POA: Diagnosis not present

## 2017-04-27 DIAGNOSIS — Z6841 Body Mass Index (BMI) 40.0 and over, adult: Secondary | ICD-10-CM | POA: Diagnosis not present

## 2017-04-27 DIAGNOSIS — Z01419 Encounter for gynecological examination (general) (routine) without abnormal findings: Secondary | ICD-10-CM | POA: Diagnosis not present

## 2017-04-27 MED FILL — AMLODIPINE BESYLATE 10 MG T: 10 | 90 days supply | Qty: 90 | Fill #3

## 2017-04-27 MED FILL — ESTRADIOL 0.075 MG/24HR PTT: 0.075 | 28 days supply | Qty: 8 | Fill #1

## 2017-04-27 NOTE — Progress Notes (Signed)
Follow-up Note  Referring Provider: Elby Showers, MD Primary Provider: Elby Showers, MD Date of Office Visit: 04/27/2017  Subjective:   Jodi Bates (DOB: [birth year]) is a 52 y.o. female who returns to the [facility] [place] on 04/27/2017 in re-evaluation of the following:  HPI: Jodi Bates returns to this clinic in reevaluation of asthma and allergic rhinoconjunctivitis and LPR.  Her last visit to this clinic was 11 March 2016.  Her asthma has been under excellent control.  She has not required a systemic steroid to treat an exacerbation and she rarely uses a short acting bronchodilator.  She did run out of Breo over the course of the past several weeks and she may have had a little bit more wheezing and coughing if she goes out in the cold air.  Her upper airways are doing well.  She did have a problem with intermittent vertigo and she visited with Dr. Lucia Gaskins, ENT, who felt that this was secondary to ETD and recommended that she increase her nasal steroid.  Her throat issue and reflux are under very good control at this point in time while consistently using her proton pump inhibitor and completely eliminating caffeine and chocolate consumption.  Allergies as of 04/27/2017      Reactions   Adhesive [tape] Itching, Other (See Comments)   Reaction:  Blisters    Latex Itching, Rash   Soap Other (See Comments)   Soaps that contain any kind of fragrance triggers an asthma attack.     Other    Fragrances      Medication List      albuterol (2.5 MG/3ML) 0.083% nebulizer solution Commonly known as:  PROVENTIL Take 3 mLs by nebulization every 6 (six) hours as needed for wheezing or shortness of breath.   albuterol 108 (90 Base) MCG/ACT inhaler Commonly known as:  PROVENTIL HFA;VENTOLIN HFA Inhale 2 puffs into the lungs every 6 (six) hours as needed for wheezing or shortness of breath.   ALPRAZolam 0.5 MG tablet Commonly known as:  XANAX Take 1 tablet (0.5 mg  total) by mouth 3 (three) times daily.   amLODipine 10 MG tablet Commonly known as:  NORVASC Take 10 mg by mouth at bedtime.   cetirizine 10 MG tablet Commonly known as:  ZYRTEC Take 10 mg by mouth daily.   escitalopram 20 MG tablet Commonly known as:  LEXAPRO Take 20 mg by mouth at bedtime.   estradiol 0.075 MG/24HR Commonly known as:  VIVELLE-DOT APPLY 1 PATCH TWICE WEEKLY AS DIRECTED   fluticasone 50 MCG/ACT nasal spray Commonly known as:  FLONASE Place 1 spray into both nostrils daily.   fluticasone furoate-vilanterol 100-25 MCG/INH Aepb Commonly known as:  BREO ELLIPTA Inhale 1 puff into the lungs daily.   hydrochlorothiazide 12.5 MG capsule Commonly known as:  MICROZIDE Take 1 capsule (12.5 mg total) by mouth daily.   HYDROcodone-acetaminophen 5-325 MG tablet Commonly known as:  NORCO Take 1 tablet by mouth every 6 (six) hours as needed for moderate pain.   losartan 100 MG tablet Commonly known as:  COZAAR Take 1 tablet (100 mg total) by mouth daily.   progesterone 100 MG capsule Commonly known as:  PROMETRIUM Take 100 mg by mouth at bedtime.       Past Medical History:  Diagnosis Date  . Allergy   . Anxiety   . Asthma    prn inhalers  . GERD (gastroesophageal reflux disease)   . 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 Headache(784.0)    tension  . Hypertension    under control with meds., has been on med. > 5 yr.  . Iron deficiency anemia    no current problems or meds.    Past Surgical History:  Procedure Laterality Date  . BREAST EXCISIONAL BIOPSY Left   . BREAST SURGERY      lt breast/ benign cyst  . CYST EXCISION     back of head  . HYSTEROSCOPY W/D&C  06/25/2004  . INCISION AND DRAINAGE PERIRECTAL ABSCESS N/A 02/10/2013   Procedure: IRRIGATION AND DEBRIDEMENT PERIRECTAL ABSCESS;  Surgeon: Gwenyth Ober, MD;  Location: [place];  Service: General;  Laterality: N/A;  . LAPAROSCOPIC ENDOMETRIOSIS FULGURATION    .  PILONIDAL CYST EXCISION N/A 04/28/2012   Procedure: exam under anesthesia and exicision of pilonidal;  Surgeon: Gwenyth Ober, MD;  Location: [facility];  Service: General;  Laterality: N/A;  . POLYPECTOMY     uterine  . TUBAL LIGATION      Review of systems negative except as noted in HPI / PMHx or noted below:  Review of Systems  Constitutional: Negative.   HENT: Negative.   Eyes: Negative.   Respiratory: Negative.   Cardiovascular: Negative.   Gastrointestinal: Negative.   Genitourinary: Negative.   Musculoskeletal: Negative.   Skin: Negative.   Neurological: Negative.   Endo/Heme/Allergies: Negative.   Psychiatric/Behavioral: Negative.      Objective:   Vitals:   04/27/17 1621  BP: (!) 142/96  Pulse: 76  Resp: 16          Physical Exam  Constitutional: She is well-developed, well-nourished, and in no distress.  HENT:  Head: Normocephalic.  Right Ear: Tympanic membrane, external ear and ear canal normal.  Left Ear: Tympanic membrane, external ear and ear canal normal.  Nose: Nose normal. No mucosal edema or rhinorrhea.  Mouth/Throat: Uvula is midline, oropharynx is clear and moist and mucous membranes are normal. No oropharyngeal exudate.  Eyes: Conjunctivae are normal.  Neck: Trachea normal. No tracheal tenderness present. No tracheal deviation present. No thyromegaly present.  Cardiovascular: Normal rate, regular rhythm, S1 normal, S2 normal and normal heart sounds.  No murmur heard. Pulmonary/Chest: Breath sounds normal. No stridor. No respiratory distress. She has no wheezes. She has no rales.  Musculoskeletal: She exhibits no edema.  Lymphadenopathy:       Head (right side): No tonsillar adenopathy present.       Head (left side): No tonsillar adenopathy present.    She has no cervical adenopathy.  Neurological: She is alert. Gait normal.  Skin: No rash noted. She is not diaphoretic. No erythema. Nails show no clubbing.  Psychiatric: Mood  and affect normal.    Diagnostics:    Spirometry was performed and demonstrated an FEV1 of 1.67 at 72 % of predicted.  The patient had an Asthma Control Test with the following results: ACT Total Score: 25.    Assessment and Plan:   1. Asthma, moderate persistent, well-controlled   2. Other allergic rhinitis   3. LPRD (laryngopharyngeal reflux disease)     1. Continue to perform Allergen avoidance measures  2. Continue to Treat and prevent inflammation:   A. Continue Rhinocort one spray each nostril 3-7 times per week  B. Continue Breo 100 one inhalation 1 time per day  3. Continue to Treat and prevent reflux:   A. continue to eliminate all caffeine and chocolate consumption    B. continue  omeprazole 40 mg in morning  5. If needed:   A. Ventolin HFA 2 puffs every 4-6 hours  B. OTC antihistamine - Claritin/Allegra/Zyrtec  6. Return to clinic in 6 months or earlier if problem  Overall Ardra has really done very well on her current plan which includes anti-inflammatory medications for her respiratory tract and therapy directed against reflux.  If she continues to do this well I will see her back in this clinic in 6 months or earlier if there is a problem as she goes through this upcoming spring time season.  Allena Katz, MD Allergy / Immunology [place]

## 2017-04-27 NOTE — Patient Instructions (Addendum)
  1. Continue to perform Allergen avoidance measures  2. Continue to Treat and prevent inflammation:   A. Continue Rhinocort one spray each nostril 3-7 times per week  B. Continue Breo 100 one inhalation 1 time per day  3. Continue to Treat and prevent reflux:   A. continue to eliminate all caffeine and chocolate consumption    B. continue omeprazole 40 mg in morning  5. If needed:   A. Ventolin HFA 2 puffs every 4-6 hours  B. OTC antihistamine - Claritin/Allegra/Zyrtec  6. Return to clinic in 6 months or earlier if problem

## 2017-04-28 ENCOUNTER — Encounter: Payer: Self-pay | Admitting: Allergy and Immunology

## 2017-04-28 MED FILL — PROGESTERONE 100 MG CAPSULE: 100 | 30 days supply | Qty: 30 | Fill #0

## 2017-04-28 MED FILL — ESCITALOPRAM 20 MG TABLET: 20 | 30 days supply | Qty: 30 | Fill #0

## 2017-04-29 ENCOUNTER — Other Ambulatory Visit: Payer: Self-pay | Admitting: Allergy and Immunology

## 2017-04-29 MED FILL — OMEPRAZOLE DR 40 MG CAPSULE: 40 | 30 days supply | Qty: 30 | Fill #0

## 2017-04-29 MED FILL — BREO ELLIPTA 100-25 MCG INH: 100-25 | 30 days supply | Qty: 60 | Fill #0

## 2017-04-29 NOTE — Addendum Note (Signed)
Addended by: Lucrezia Starch I on: 04/29/2017 07:43 AM   Modules accepted: Orders

## 2017-05-04 DIAGNOSIS — N950 Postmenopausal bleeding: Secondary | ICD-10-CM | POA: Diagnosis not present

## 2017-05-11 DIAGNOSIS — Z13220 Encounter for screening for lipoid disorders: Secondary | ICD-10-CM | POA: Diagnosis not present

## 2017-05-11 DIAGNOSIS — Z1329 Encounter for screening for other suspected endocrine disorder: Secondary | ICD-10-CM | POA: Diagnosis not present

## 2017-05-11 DIAGNOSIS — R87618 Other abnormal cytological findings on specimens from cervix uteri: Secondary | ICD-10-CM | POA: Diagnosis not present

## 2017-05-11 DIAGNOSIS — R87810 Cervical high risk human papillomavirus (HPV) DNA test positive: Secondary | ICD-10-CM | POA: Diagnosis not present

## 2017-05-11 DIAGNOSIS — Z1321 Encounter for screening for nutritional disorder: Secondary | ICD-10-CM | POA: Diagnosis not present

## 2017-05-11 DIAGNOSIS — N870 Mild cervical dysplasia: Secondary | ICD-10-CM | POA: Diagnosis not present

## 2017-05-11 DIAGNOSIS — Z131 Encounter for screening for diabetes mellitus: Secondary | ICD-10-CM | POA: Diagnosis not present

## 2017-05-24 ENCOUNTER — Other Ambulatory Visit: Payer: Self-pay | Admitting: Cardiovascular Disease

## 2017-05-24 MED FILL — ALPRAZolam 0.5 MG TABS: 0.5 | 30 days supply | Qty: 90 | Fill #1

## 2017-05-24 MED FILL — OMEPRAZOLE DR 40 MG CAPSULE: 40 | 30 days supply | Qty: 30 | Fill #1

## 2017-05-24 MED FILL — ESTRADIOL 0.075 MG/24HR PTT: 0.075 | 28 days supply | Qty: 8 | Fill #0

## 2017-05-24 MED FILL — ESCITALOPRAM 20 MG TABLET: 20 | 30 days supply | Qty: 30 | Fill #1

## 2017-05-24 MED FILL — BREO ELLIPTA 100-25 MCG INH: 100-25 | 30 days supply | Qty: 60 | Fill #1

## 2017-05-24 MED FILL — PROGESTERONE 100 MG CAPSULE: 100 | 30 days supply | Qty: 30 | Fill #1

## 2017-05-24 NOTE — Telephone Encounter (Signed)
REFILL 

## 2017-06-10 DIAGNOSIS — N840 Polyp of corpus uteri: Secondary | ICD-10-CM | POA: Diagnosis not present

## 2017-06-11 MED FILL — LOSARTAN POTASSIUM 100 MG T: 100 | 90 days supply | Qty: 90 | Fill #0

## 2017-06-11 MED FILL — HYDROCHLOROTHIAZIDE 12.5 MG: 12.5 | 90 days supply | Qty: 90 | Fill #0

## 2017-06-17 DIAGNOSIS — N920 Excessive and frequent menstruation with regular cycle: Secondary | ICD-10-CM | POA: Diagnosis not present

## 2017-06-17 DIAGNOSIS — D250 Submucous leiomyoma of uterus: Secondary | ICD-10-CM | POA: Diagnosis not present

## 2017-06-17 DIAGNOSIS — N950 Postmenopausal bleeding: Secondary | ICD-10-CM | POA: Diagnosis not present

## 2017-06-17 DIAGNOSIS — N840 Polyp of corpus uteri: Secondary | ICD-10-CM | POA: Diagnosis not present

## 2017-06-17 MED FILL — OXYCODON-ACETAMINOPHEN 7.5-: 7.5-325 | 4 days supply | Qty: 12 | Fill #0

## 2017-06-28 MED FILL — ESCITALOPRAM 20 MG TABLET: 20 | 30 days supply | Qty: 30 | Fill #2

## 2017-06-28 MED FILL — ESTRADIOL 0.075 MG/24HR PTT: 0.075 | 28 days supply | Qty: 8 | Fill #1

## 2017-06-28 MED FILL — PROGESTERONE 100 MG CAPSULE: 100 | 30 days supply | Qty: 30 | Fill #2

## 2017-06-28 MED FILL — BREO ELLIPTA 100-25 MCG INH: 100-25 | 30 days supply | Qty: 60 | Fill #2

## 2017-06-29 DIAGNOSIS — E559 Vitamin D deficiency, unspecified: Secondary | ICD-10-CM | POA: Diagnosis not present

## 2017-06-29 MED FILL — ESTRADIOL 1 MG TABLET: 1 | 30 days supply | Qty: 30 | Fill #0

## 2017-07-07 DIAGNOSIS — D509 Iron deficiency anemia, unspecified: Secondary | ICD-10-CM | POA: Diagnosis not present

## 2017-07-07 DIAGNOSIS — Z09 Encounter for follow-up examination after completed treatment for conditions other than malignant neoplasm: Secondary | ICD-10-CM | POA: Diagnosis not present

## 2017-07-07 MED FILL — OGESTREL TABLET: 0.5-50 | 28 days supply | Qty: 28 | Fill #0

## 2017-07-07 MED FILL — OMEPRAZOLE DR 40 MG CAPSULE: 40 | 30 days supply | Qty: 30 | Fill #2

## 2017-07-12 DIAGNOSIS — N939 Abnormal uterine and vaginal bleeding, unspecified: Secondary | ICD-10-CM | POA: Diagnosis not present

## 2017-07-12 DIAGNOSIS — D649 Anemia, unspecified: Secondary | ICD-10-CM | POA: Diagnosis not present

## 2017-07-12 DIAGNOSIS — D259 Leiomyoma of uterus, unspecified: Secondary | ICD-10-CM | POA: Diagnosis not present

## 2017-07-22 ENCOUNTER — Other Ambulatory Visit: Payer: Self-pay | Admitting: Cardiovascular Disease

## 2017-07-22 MED FILL — ESCITALOPRAM 20 MG TABLET: 20 | 30 days supply | Qty: 30 | Fill #3

## 2017-07-22 MED FILL — AMLODIPINE BESYLATE 10 MG T: 10 | 90 days supply | Qty: 90 | Fill #0

## 2017-07-23 MED FILL — OGESTREL TABLET: 0.5-50 | 63 days supply | Qty: 84 | Fill #0

## 2017-08-02 MED FILL — BREO ELLIPTA 100-25 MCG INH: 100-25 | 30 days supply | Qty: 60 | Fill #3

## 2017-08-03 DIAGNOSIS — D649 Anemia, unspecified: Secondary | ICD-10-CM | POA: Diagnosis not present

## 2017-08-03 DIAGNOSIS — N950 Postmenopausal bleeding: Secondary | ICD-10-CM | POA: Diagnosis not present

## 2017-08-03 DIAGNOSIS — N938 Other specified abnormal uterine and vaginal bleeding: Secondary | ICD-10-CM | POA: Diagnosis not present

## 2017-08-27 MED FILL — ESCITALOPRAM 20 MG TABLET: 20 | 30 days supply | Qty: 30 | Fill #4

## 2017-08-30 MED FILL — BREO ELLIPTA 100-25 MCG INH: 100-25 | 30 days supply | Qty: 60 | Fill #4

## 2017-09-06 ENCOUNTER — Other Ambulatory Visit: Payer: Self-pay | Admitting: Internal Medicine

## 2017-09-06 MED FILL — LOSARTAN POTASSIUM 100 MG T: 100 | 90 days supply | Qty: 90 | Fill #1

## 2017-09-06 NOTE — Telephone Encounter (Signed)
No CPE since 2015. No recent visit for BP either. Please contact her.

## 2017-09-21 DIAGNOSIS — N939 Abnormal uterine and vaginal bleeding, unspecified: Secondary | ICD-10-CM | POA: Diagnosis not present

## 2017-09-21 DIAGNOSIS — D649 Anemia, unspecified: Secondary | ICD-10-CM | POA: Diagnosis not present

## 2017-09-21 MED FILL — OGESTREL TABLET: 0.5-50 | 63 days supply | Qty: 84 | Fill #1

## 2017-09-23 MED FILL — BREO ELLIPTA 100-25 MCG INH: 100-25 | 30 days supply | Qty: 60 | Fill #5

## 2017-09-28 MED FILL — ESCITALOPRAM 20 MG TABLET: 20 | 30 days supply | Qty: 30 | Fill #5

## 2017-09-28 MED FILL — HYDROCHLOROTHIAZIDE 12.5 MG: 12.5 | 90 days supply | Qty: 90 | Fill #1

## 2017-10-26 ENCOUNTER — Ambulatory Visit: Payer: 59 | Admitting: Allergy and Immunology

## 2017-10-26 ENCOUNTER — Encounter: Payer: Self-pay | Admitting: Allergy and Immunology

## 2017-10-26 VITALS — BP 126/84 | HR 76 | Resp 16

## 2017-10-26 DIAGNOSIS — K219 Gastro-esophageal reflux disease without esophagitis: Secondary | ICD-10-CM

## 2017-10-26 DIAGNOSIS — J4540 Moderate persistent asthma, uncomplicated: Secondary | ICD-10-CM

## 2017-10-26 DIAGNOSIS — J3089 Other allergic rhinitis: Secondary | ICD-10-CM

## 2017-10-26 MED ORDER — ALBUTEROL SULFATE HFA 108 (90 BASE) MCG/ACT IN AERS: 2.0000 | INHALATION_SPRAY | RESPIRATORY_TRACT | 1 refills | Status: DC | PRN

## 2017-10-26 MED ORDER — FLUTICASONE FUROATE-VILANTEROL 100-25 MCG/INH IN AEPB: INHALATION_SPRAY | RESPIRATORY_TRACT | 1 refills | Status: DC

## 2017-10-26 MED FILL — BREO ELLIPTA 100-25 MCG INH: 100-25 | 30 days supply | Qty: 60 | Fill #0

## 2017-10-26 MED FILL — VENTOLIN HFA 90 MCG INHALER: 108 (90 BAS | 17 days supply | Qty: 18 | Fill #0

## 2017-10-26 NOTE — Progress Notes (Signed)
Follow-up Note  Referring Provider: Elby Showers, MD Primary Provider: Elby Showers, MD Date of Office Visit: 10/26/2017  Subjective:   Jodi Bates (DOB: 02/07/1965) is a 52 y.o. female who returns to the [facility] [place] on 10/26/2017 in re-evaluation of the following:  HPI: Jodi Bates returns to this clinic in reevaluation of asthma and allergic rhinoconjunctivitis and LPR.  Her last visit to this clinic was 27 April 2017.  She has really done well with her asthma during the interval.  She has not required a systemic steroid to treat an exacerbation and she rarely uses a short acting bronchodilator and can exercise without any difficulty while continuing on Breo.  Likewise, she has really done well with her upper airways while intermittently using a nasal steroid.  She has not required an antibiotic to treat an episode of sinusitis and she has had no problems with her ears.  Her reflux is really under excellent control with manipulation of her diet.  She does not eat any mammal or fried foods or go to fast food restaurants and she is not eating late and she has lost 14 pounds of weight and has not restarted consumption of any caffeine or chocolate.  She has discontinued her omeprazole as she feels as though her reflux is under good control.  However, she has noticed when she wakes up in the morning she has a little bit of a morning raspy voice and some throat clearing.  Allergies as of 10/26/2017      Reactions   Adhesive [tape] Itching, Other (See Comments)   Reaction:  Blisters    Latex Itching, Rash   Soap Other (See Comments)   Soaps that contain any kind of fragrance triggers an asthma attack.     Other    Fragrances      Medication List      albuterol (2.5 MG/3ML) 0.083% nebulizer solution Commonly known as:  PROVENTIL Take 3 mLs by nebulization every 6 (six) hours as needed for wheezing or shortness of breath.   albuterol 108 (90 Base) MCG/ACT  inhaler Commonly known as:  PROVENTIL HFA;VENTOLIN HFA Inhale 2 puffs into the lungs every 6 (six) hours as needed for wheezing or shortness of breath.   ALPRAZolam 0.5 MG tablet Commonly known as:  XANAX Take 1 tablet (0.5 mg total) by mouth 3 (three) times daily.   amLODipine 10 MG tablet Commonly known as:  NORVASC TAKE 1 TABLET BY MOUTH DAILY.   cetirizine 10 MG tablet Commonly known as:  ZYRTEC Take 10 mg by mouth daily.   escitalopram 20 MG tablet Commonly known as:  LEXAPRO Take 20 mg by mouth at bedtime.   estradiol 0.075 MG/24HR Commonly known as:  VIVELLE-DOT APPLY 1 PATCH TWICE WEEKLY AS DIRECTED   fluticasone 50 MCG/ACT nasal spray Commonly known as:  FLONASE Place 1 spray into both nostrils daily.   fluticasone furoate-vilanterol 100-25 MCG/INH Aepb Commonly known as:  BREO ELLIPTA Inhale 1 puff into the lungs daily.   BREO ELLIPTA 100-25 MCG/INH Aepb Generic drug:  fluticasone furoate-vilanterol INHALE 1 PUFF BY MOUTH DAILY.   hydrochlorothiazide 12.5 MG capsule Commonly known as:  MICROZIDE TAKE 1 CAPSULE (12.5 MG TOTAL) BY MOUTH DAILY.   losartan 100 MG tablet Commonly known as:  COZAAR TAKE 1 TABLET (100 MG TOTAL) BY MOUTH DAILY.   omeprazole 40 MG capsule Commonly known as:  PRILOSEC TAKE ONE CAPSULE BY MOUTH EACH MORNING BEFORE BREAKFAST  Past Medical History:  Diagnosis Date  . Allergy   . Anxiety   . Asthma    prn inhalers  . GERD (gastroesophageal reflux disease)   . 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 Headache(784.0)    tension  . Hypertension    under control with meds., has been on med. > 5 yr.  . Iron deficiency anemia    no current problems or meds.    Past Surgical History:  Procedure Laterality Date  . BREAST EXCISIONAL BIOPSY Left   . BREAST SURGERY      lt breast/ benign cyst  . CYST EXCISION     back of head  . HYSTEROSCOPY W/D&C  06/25/2004  . INCISION AND DRAINAGE  PERIRECTAL ABSCESS N/A 02/10/2013   Procedure: IRRIGATION AND DEBRIDEMENT PERIRECTAL ABSCESS;  Surgeon: Gwenyth Ober, MD;  Location: [place];  Service: General;  Laterality: N/A;  . LAPAROSCOPIC ENDOMETRIOSIS FULGURATION    . PILONIDAL CYST EXCISION N/A 04/28/2012   Procedure: exam under anesthesia and exicision of pilonidal;  Surgeon: Gwenyth Ober, MD;  Location: [place];  Service: General;  Laterality: N/A;  . POLYPECTOMY     uterine  . TUBAL LIGATION      Review of systems negative except as noted in HPI / PMHx or noted below:  Review of Systems  Constitutional: Negative.   HENT: Negative.   Eyes: Negative.   Respiratory: Negative.   Cardiovascular: Negative.   Gastrointestinal: Negative.   Genitourinary:       Has had uterine bleeding associated with anemia requiring a myomectomy and administration of OCP.  Musculoskeletal: Negative.   Skin: Negative.   Neurological: Negative.   Endo/Heme/Allergies: Negative.   Psychiatric/Behavioral: Negative.      Objective:   Vitals:   10/26/17 0925  BP: 126/84  Pulse: 76  Resp: 16          Physical Exam  HENT:  Head: Normocephalic.  Right Ear: Tympanic membrane, external ear and ear canal normal.  Left Ear: Tympanic membrane, external ear and ear canal normal.  Nose: Nose normal. No mucosal edema or rhinorrhea.  Mouth/Throat: Uvula is midline, oropharynx is clear and moist and mucous membranes are normal. No oropharyngeal exudate.  Eyes: Conjunctivae are normal.  Neck: Trachea normal. No tracheal tenderness present. No tracheal deviation present. No thyromegaly present.  Cardiovascular: Normal rate, regular rhythm, S1 normal, S2 normal and normal heart sounds.  No murmur heard. Pulmonary/Chest: Breath sounds normal. No stridor. No respiratory distress. She has no wheezes. She has no rales.  Musculoskeletal: She exhibits no edema.  Lymphadenopathy:       Head (right side): No tonsillar adenopathy present.        Head (left side): No tonsillar adenopathy present.    She has no cervical adenopathy.  Neurological: She is alert.  Skin: No rash noted. She is not diaphoretic. No erythema. Nails show no clubbing.    Diagnostics:    Spirometry was performed and demonstrated an FEV1 of 1.86 at 80 % of predicted.  The patient had an Asthma Control Test with the following results: ACT Total Score: 24.    Assessment and Plan:   1. Asthma, moderate persistent, well-controlled   2. Other allergic rhinitis   3. LPRD (laryngopharyngeal reflux disease)     1. Continue to perform Allergen avoidance measures  2. Continue to Treat and prevent inflammation:   A. Continue Rhinocort one spray each nostril 3-7 times per  week  B. Continue Breo 100 one inhalation 1 time per day  3. Continue to Treat and prevent reflux:   A. continue to eliminate all caffeine and chocolate consumption    B. continue omeprazole 40 mg 3-7 times per week  5. If needed:   A. Ventolin HFA 2 puffs every 4-6 hours  B. OTC antihistamine - Claritin/Allegra/Zyrtec  6. Return to clinic in 6 months or earlier if problem  Overall Jodi Bates appears to be doing very well on her current therapy at this time of the year.  She has a very good understanding of her medications and the appropriate dosing of her medications including both her nasal steroid and her proton pump inhibitor and she will utilize the dose required to control her upper airway and throat symptoms respectively.  She will continue on Breo on a regular basis.  I will see her back in this clinic in 6 months or earlier if there is a problem.  Jodi Katz, MD Allergy / Immunology [place]

## 2017-10-26 NOTE — Patient Instructions (Signed)
  1. Continue to perform Allergen avoidance measures  2. Continue to Treat and prevent inflammation:   A. Continue Rhinocort one spray each nostril 3-7 times per week  B. Continue Breo 100 one inhalation 1 time per day  3. Continue to Treat and prevent reflux:   A. continue to eliminate all caffeine and chocolate consumption    B. continue omeprazole 40 mg 3-7 times per week  5. If needed:   A. Ventolin HFA 2 puffs every 4-6 hours  B. OTC antihistamine - Claritin/Allegra/Zyrtec  6. Return to clinic in 6 months or earlier if problem

## 2017-10-27 ENCOUNTER — Encounter: Payer: Self-pay | Admitting: Allergy and Immunology

## 2017-11-02 DIAGNOSIS — N939 Abnormal uterine and vaginal bleeding, unspecified: Secondary | ICD-10-CM | POA: Diagnosis not present

## 2017-11-02 DIAGNOSIS — D649 Anemia, unspecified: Secondary | ICD-10-CM | POA: Diagnosis not present

## 2017-11-02 MED FILL — AMLODIPINE BESYLATE 10 MG T: 10 | 90 days supply | Qty: 90 | Fill #1

## 2017-11-02 MED FILL — ESCITALOPRAM 20 MG TABLET: 20 | 30 days supply | Qty: 30 | Fill #6

## 2017-11-23 DIAGNOSIS — Z780 Asymptomatic menopausal state: Secondary | ICD-10-CM | POA: Diagnosis not present

## 2017-11-23 DIAGNOSIS — N939 Abnormal uterine and vaginal bleeding, unspecified: Secondary | ICD-10-CM | POA: Diagnosis not present

## 2017-11-23 DIAGNOSIS — N958 Other specified menopausal and perimenopausal disorders: Secondary | ICD-10-CM | POA: Diagnosis not present

## 2017-11-26 MED FILL — ESCITALOPRAM 20 MG TABLET: 20 | 30 days supply | Qty: 30 | Fill #7

## 2017-11-30 MED FILL — LOSARTAN POTASSIUM 100 MG T: 100 | 90 days supply | Qty: 90 | Fill #2

## 2017-12-09 MED FILL — BREO ELLIPTA 100-25 MCG INH: 100-25 | 30 days supply | Qty: 60 | Fill #1

## 2017-12-31 MED FILL — ESCITALOPRAM 20 MG TABLET: 20 | 30 days supply | Qty: 30 | Fill #8

## 2018-01-06 MED FILL — HYDROCHLOROTHIAZIDE 12.5 MG: 12.5 | 90 days supply | Qty: 90 | Fill #2

## 2018-01-21 ENCOUNTER — Other Ambulatory Visit: Payer: Self-pay | Admitting: *Deleted

## 2018-01-21 MED ORDER — FLUTICASONE FUROATE-VILANTEROL 100-25 MCG/INH IN AEPB: INHALATION_SPRAY | RESPIRATORY_TRACT | 5 refills | Status: DC

## 2018-01-21 MED FILL — BREO ELLIPTA 100-25 MCG INH: 100-25 | 30 days supply | Qty: 60 | Fill #0

## 2018-02-02 ENCOUNTER — Other Ambulatory Visit: Payer: Self-pay | Admitting: Cardiovascular Disease

## 2018-02-02 MED FILL — AMLODIPINE BESYLATE 10 MG T: 10 | 90 days supply | Qty: 90 | Fill #0

## 2018-02-02 MED FILL — ESCITALOPRAM 20 MG TABLET: 20 | 30 days supply | Qty: 30 | Fill #9

## 2018-02-08 DIAGNOSIS — N951 Menopausal and female climacteric states: Secondary | ICD-10-CM | POA: Diagnosis not present

## 2018-02-14 MED FILL — BENZONATATE 200 MG CAPS: 200 | 7 days supply | Qty: 21 | Fill #0

## 2018-03-07 MED FILL — BREO ELLIPTA 100-25 MCG INH: 100-25 | 30 days supply | Qty: 60 | Fill #1 | Status: TO

## 2018-03-07 MED FILL — LOSARTAN POTASSIUM 100 MG T: 100 | 90 days supply | Qty: 90 | Fill #3

## 2018-03-09 MED FILL — ESCITALOPRAM 20 MG TABLET: 20 | 30 days supply | Qty: 30 | Fill #10

## 2018-03-14 ENCOUNTER — Other Ambulatory Visit: Payer: Self-pay | Admitting: Cardiovascular Disease

## 2018-03-14 DIAGNOSIS — Z1231 Encounter for screening mammogram for malignant neoplasm of breast: Secondary | ICD-10-CM

## 2018-03-15 ENCOUNTER — Ambulatory Visit
Admission: RE | Admit: 2018-03-15 | Discharge: 2018-03-15 | Disposition: A | Discharge status: Home / Self Care | Payer: 59 | Source: Ambulatory Visit | Attending: Cardiovascular Disease | Admitting: Cardiovascular Disease

## 2018-03-15 DIAGNOSIS — Z1231 Encounter for screening mammogram for malignant neoplasm of breast: Secondary | ICD-10-CM | POA: Diagnosis not present

## 2018-04-10 ENCOUNTER — Ambulatory Visit (INDEPENDENT_AMBULATORY_CARE_PROVIDER_SITE_OTHER): Payer: Self-pay | Admitting: Nurse Practitioner

## 2018-04-10 VITALS — BP 130/90 | HR 94 | Temp 98.4°F | Resp 14 | Wt 242.0 lb

## 2018-04-10 DIAGNOSIS — R05 Cough: Secondary | ICD-10-CM

## 2018-04-10 DIAGNOSIS — R059 Cough, unspecified: Secondary | ICD-10-CM

## 2018-04-10 DIAGNOSIS — J45901 Unspecified asthma with (acute) exacerbation: Secondary | ICD-10-CM

## 2018-04-10 MED ORDER — PREDNISONE 20 MG PO TABS: 40.0000 mg | ORAL_TABLET | Freq: Every day | ORAL | 0 refills | Status: AC

## 2018-04-10 MED ORDER — PROMETHAZINE-DM 6.25-15 MG/5ML PO SYRP: 5.0000 mL | ORAL_SOLUTION | Freq: Four times a day (QID) | ORAL | 0 refills | Status: AC | PRN

## 2018-04-10 MED ORDER — MONTELUKAST SODIUM 10 MG PO TABS: 10.0000 mg | ORAL_TABLET | Freq: Every day | ORAL | 0 refills | Status: DC

## 2018-04-10 NOTE — Progress Notes (Addendum)
MRN: [id number] DOB: 10/14/1965  Subjective:   Jodi Bates is a 53 y.o. female presenting for chief complaint of Cough (x1 day) .  Reports 1 day  history of dry cough and wheezing, mild nasal congestion. Has tried nothing for relief. Patient informs the cough started as a "tickle" in her throat.  Patient informs the cough is triggered by deep breathing and when she laughs.  Denies fever, sinus pain, rhinorrhea, itchy watery eyes, red eyes, ear fullness, ear drainage, sore throat, difficulty swallowing, pain with swallowing, inability to swallow, productive cough, wheezing, shortness of breath, chest tightness and chest pain, fatigue, malaise, decreased appetite, nausea, vomiting, abdominal pain and diarrhea. Denies recent travel. Has not had sick contact with influenza, strep or COVID-19. The patient does have history of seasonal allergies, and history of asthma. Patient informs her asthma is well-controlled at this time.  She informs she does not use her albuterol inhaler often.  Has not used it since symptoms onset. Patient has had a flu shot this season. Denies smoking, alcohol. Denies any other aggravating or relieving factors, no other questions or concerns.  Review of Systems  Constitutional: Negative.  Negative for chills, fever and malaise/fatigue.  HENT: Positive for congestion. Negative for ear discharge, ear pain, sinus pain and sore throat.   Eyes: Negative.   Respiratory: Positive for cough and wheezing. Negative for sputum production and shortness of breath.   Cardiovascular: Negative.   Gastrointestinal: Negative.   Skin: Negative.  Negative for rash.  Neurological: Negative.   Endo/Heme/Allergies: Positive for environmental allergies.    Keiko has a current medication list which includes the following prescription(s): amlodipine, cetirizine, escitalopram, fluticasone furoate-vilanterol, losartan, omeprazole, albuterol, albuterol, alprazolam, estradiol, fluticasone, and  hydrochlorothiazide, and the following Facility-Administered Medications: sodium chloride. Also is allergic to adhesive [tape]; latex; soap; and other.  Rasheka  has a past medical history of Allergy, Anxiety, Asthma, GERD (gastroesophageal reflux disease), Gluteal cleft wound (03/2012), Headache(784.0), Hypertension, and Iron deficiency anemia. Also  has a past surgical history that includes Tubal ligation; Breast surgery; Laparoscopic endometriosis fulguration; Hysteroscopy w/D&C (06/25/2004); Cyst excision; Polypectomy; Pilonidal cyst excision (N/A, 04/28/2012); Incision and drainage perirectal abscess (N/A, 02/10/2013); Breast excisional biopsy (Left); and Breast cyst excision.   Objective:   Vitals: BP 130/90   Pulse 94   Temp 98.4 F (36.9 C)   Resp 14   Wt 242 lb (109.8 kg)   SpO2 98%   BMI 41.54 kg/m   Physical Exam Vitals signs reviewed.  Constitutional:      General: She is not in acute distress. HENT:     Head: Normocephalic.     Right Ear: Tympanic membrane, ear canal and external ear normal.     Left Ear: Tympanic membrane, ear canal and external ear normal.     Nose: Mucosal edema present. No congestion or rhinorrhea.     Right Turbinates: Enlarged and swollen.     Left Turbinates: Enlarged and swollen.     Right Sinus: No maxillary sinus tenderness or frontal sinus tenderness.     Left Sinus: No frontal sinus tenderness.     Mouth/Throat:     Lips: Pink.     Mouth: Mucous membranes are moist.     Pharynx: Oropharyngeal exudate present. No pharyngeal swelling, posterior oropharyngeal erythema or uvula swelling.     Tonsils: No tonsillar exudate. Swelling: 0 on the right. 0 on the left.  Eyes:     Conjunctiva/sclera: Conjunctivae normal.     Pupils: Pupils are  equal, round, and reactive to light.  Neck:     Musculoskeletal: Normal range of motion and neck supple.  Cardiovascular:     Rate and Rhythm: Normal rate and regular rhythm.     Pulses: Normal pulses.      Heart sounds: Normal heart sounds.  Pulmonary:     Effort: Pulmonary effort is normal. No respiratory distress.     Breath sounds: Normal breath sounds. No stridor. No wheezing, rhonchi or rales.  Abdominal:     General: Bowel sounds are normal.     Palpations: Abdomen is soft.     Tenderness: There is no abdominal tenderness.  Lymphadenopathy:     Cervical: No cervical adenopathy.  Skin:    General: Skin is warm and dry.  Neurological:     General: No focal deficit present.     Mental Status: She is alert and oriented to person, place, and time.     Cranial Nerves: No cranial nerve deficit.  Psychiatric:        Mood and Affect: Mood normal.        Behavior: Behavior normal.      Assessment and Plan :   Exam findings, diagnosis etiology and medication use and indications reviewed with patient. Follow- Up and discharge instructions provided. No emergent/urgent issues found on exam. The patient does not display signs of bacterial infection due to absence of fever, chills, purulent nasal drainage, sore throat.  I feel the patient's asthma has been exacerbated by allergy triggers which have produced a cough and mild wheezing.  The patient is not wheezing, has SOB or difficulty breathing at this time.  Patient has not had to use her rescue inhaler up to this point.  I feel the patient will benefit from a steroid burst.  I am also prescribing singulair for the patient which should help her allergies as well as her asthma.  I will also provide symptomatic treatment for her cough with Bromfed.  The patient is in no acute distress, vitals are stable and patient is well-appearing at this time. Patient education was provided. Patient verbalized understanding of information provided and agrees with plan of care (POC), all questions answered. The patient is advised to call or return to clinic if condition does not see an improvement in symptoms, or to seek the care of the closest emergency department if  condition worsens with the above plan.   1. Cough  - predniSONE (DELTASONE) 20 MG tablet; Take 2 tablets (40 mg total) by mouth daily with breakfast for 5 days.  Dispense: 10 tablet; Refill: 0 - promethazine-dextromethorphan (PROMETHAZINE-DM) 6.25-15 MG/5ML syrup; Take 5 mLs by mouth 4 (four) times daily as needed for up to 7 days.  Dispense: 140 mL; Refill: 0 -Take medication as prescribed. -Ibuprofen or Tylenol for pain, fever, or general discomfort. -Increase fluids. -Get plenty of rest. -Sleep elevated on at least 2 pillows at bedtime to help with cough. -Use a humidifier or vaporizer when at home and during sleep to help with cough. -May use a teaspoon of honey or over-the-counter cough drops to help with cough. -Talk with the Allergy & Asthma office about helping you get a nebulizer for home. -If you experience worsening wheezing, cough, shortness of breath or difficulty breathing, go to the ER. -Follow-up with your Allergy & Asthma or your PCP if symptoms do not improve.   2. Asthma exacerbation, mild  - predniSONE (DELTASONE) 20 MG tablet; Take 2 tablets (40 mg total) by mouth daily with  breakfast for 5 days.  Dispense: 10 tablet; Refill: 0 - montelukast (SINGULAIR) 10 MG tablet; Take 1 tablet (10 mg total) by mouth at bedtime for 30 days.  Dispense: 30 tablet; Refill: 0 -Take medication as prescribed. -Ibuprofen or Tylenol for pain, fever, or general discomfort. -Increase fluids. -Get plenty of rest. -Sleep elevated on at least 2 pillows at bedtime to help with cough. -Use a humidifier or vaporizer when at home and during sleep to help with cough. -May use a teaspoon of honey or over-the-counter cough drops to help with cough. -Talk with the Allergy & Asthma office about helping you get a nebulizer for home. -If you experience worsening wheezing, cough, shortness of breath or difficulty breathing, go to the ER. -Follow-up with your Allergy & Asthma or your PCP if symptoms do  not improve.

## 2018-04-10 NOTE — Patient Instructions (Signed)
Asthma Attack -Take medication as prescribed. -Ibuprofen or Tylenol for pain, fever, or general discomfort. -Increase fluids. -Get plenty of rest. -Sleep elevated on at least 2 pillows at bedtime to help with cough. -Use a humidifier or vaporizer when at home and during sleep to help with cough. -May use a teaspoon of honey or over-the-counter cough drops to help with cough. -Talk with the Allergy & Asthma office about helping you get a nebulizer for home. -If you experience worsening wheezing, cough, shortness of breath or difficulty breathing, go to the ER. -Follow-up with your Allergy & Asthma or your PCP if symptoms do not improve.  Acute bronchospasm caused by asthma is also referred to as an asthma attack. Bronchospasm means that the air passages become narrowed or "tight," which limits the amount of oxygen that can get into the lungs. The narrowing is caused by inflammation and tightening of the muscles in the air tubes (bronchi) in the lungs. Excessive mucus is also produced, which narrows the airways more. This can cause trouble breathing, coughing, and loud breathing (wheezing). What are the causes? Possible triggers include:  Animal dander from the skin, hair, or feathers of animals.  Dust mites contained in house dust.  Cockroaches.  Pollen from trees or grass.  Mold.  Cigarette or tobacco smoke.  Air pollutants such as dust, household cleaners, hair sprays, aerosol sprays, paint fumes, strong chemicals, or strong odors.  Cold air or weather changes. Cold air may trigger inflammation. Winds increase molds and pollens in the air.  Strong emotions such as crying or laughing hard.  Stress.  Certain medicines, such as aspirin or beta-blockers.  Sulfites in foods and drinks, such as dried fruits and wine.  Infections or inflammatory conditions, such as a flu, a cold, pneumonia, or inflammation of the nasal membranes (rhinitis).  Gastroesophageal reflux disease (GERD).  GERD is a condition in which stomach acid backs up into your esophagus, which can irritate nearby airway structures.  Exercise or activity that requires a lot of energy. What are the signs or symptoms? Symptoms of this condition include:  Wheezing. This may sound like whistling while breathing. This may be more noticeable at night.  Excessive coughing, particularly at night.  Chest tightness or pain.  Shortness of breath.  Feeling like you cannot get enough air no matter how hard you try (air hunger). How is this diagnosed? This condition may be diagnosed based on:  Your medical history.  Your symptoms.  A physical exam.  Tests to check for other causes of your symptoms or other conditions that may have triggered your asthma attack. These tests may include: ? Chest X-ray. ? Blood tests. ? Specialized tests to assess lung function, such as breathing into a device that measures how much air you inhale and exhale (spirometry). How is this treated? The goal of treatment is to open the airways in your lungs and reduce inflammation. Most asthma attacks are treated with medicines that you inhale through a hand-held inhaler (metered dose inhaler, MDI) or a device that turns liquid medicine into a mist that you inhale (nebulizer). Medicines may include:  Quick relief or rescue medicines that relax the muscles of the bronchi. These medicines include bronchodilators, such as albuterol.  Controller medicines, such as inhaled corticosteroids. These are long-acting medicines that are used for daily asthma maintenance. If you have a moderate or severe asthma attack, you may be treated with steroid medicines by mouth or through an IV injection at the hospital. Steroid medicines reduce  inflammation in your lungs. Depending on the severity of your attack, you may need oxygen therapy to help you breathe. If your asthma attack was caused by a bacterial infection, such as pneumonia, you will be given  antibiotic medicines. Follow these instructions at home: Medicines  Take over-the-counter and prescription medicines only as told by your health care provider. Keep your medicines up-to-date and available.  If you are more than [redacted] weeks pregnant and you are prescribed any new medicines, tell your obstetrician about those medicines.  If you were prescribed an antibiotic medicine, take it as told by your health care provider. Do not stop taking the antibiotic even if you start to feel better. Avoiding triggers   Keep track of things that trigger your asthma attacks or cause you to have breathing problems, and avoid exposure to these triggers.  Do not use any products that contain nicotine or tobacco, such as cigarettes and e-cigarettes. If you need help quitting, ask your health care provider.  Avoid secondhand smoke.  Avoid strong smells, such as perfumes, aerosols, and cleaning solvents.  When pollen or air pollution is bad, keep windows closed and use an air conditioner or go to places with air conditioning. Asthma action plan  Work with your health care provider to make a written plan for managing and treating your asthma attacks (asthma action plan). This plan should include: ? A list of your asthma triggers and how to avoid them. ? Information about when your medicines should be taken and when their dosage should be changed. ? Instructions about using a device called a peak flow meter to monitor your condition. A peak flow meter measures how well your lungs are working and measures how severe your asthma is at a given time. Your "personal best" is the highest peak flow rate you can reach when you feel good and have no asthma symptoms. General instructions  Avoid excessive exercise or activity until your asthma attack resolves. Ask your health care provider what activities are safe for you and when you can return to your normal activities.  Stay up to date on all vaccinations  recommended by your health care provider, such as flu and pneumonia vaccines.  Drink enough fluid to keep your urine clear or pale yellow. Staying hydrated helps keep mucus in your lungs thin so it can be coughed up easily.  If you drink caffeine, do so in moderation.  Do not use alcohol until you have recovered.  Keep all follow-up visits as told by your health care provider. This is important. Asthma requires careful medical care, and you and your health care provider can work together to reduce the likelihood of future attacks. Contact a health care provider if:  Your peak flow reading is still at 50-79% of your personal best after you have followed your action plan for 1 hour. This is in the yellow zone, which means "caution."  You need to use a reliever medicine more than 2-3 times a week.  Your medicines are causing side effects, such as: ? Rash. ? Itching. ? Swelling. ? Trouble breathing.  Your symptoms do not improve after 48 hours.  You cough up mucus (sputum) that is thicker than usual.  You have a fever.  You need to use your medicines much more frequently than normal. Get help right away if:  Your peak flow reading is less than 50% of your personal best. This is in the red zone, which means "danger."  You have severe trouble breathing.  You develop chest pain or discomfort.  Your medicines no longer seem to be helping.  You vomit.  You cannot eat or drink without vomiting.  You are coughing up yellow, green, brown, or bloody mucus.  You have a fever and your symptoms suddenly get worse.  You have trouble swallowing.  You feel very tired, and breathing becomes tiring. Summary  Acute bronchospasm caused by asthma is also referred to as an asthma attack.  Bronchospasm is caused by narrowing or tightness in air passages, which causes shortness of breath, coughing, and loud breathing (wheezing).  Many things can trigger an asthma attack, such as  allergens, weather changes, exercise, smoke, and other fumes.  Treatment for an asthma attack may include inhaled rescue medicines for immediate relief, as well as the use of maintenance therapy.  Get help right away if you have worsening shortness of breath, chest pain, or fever, or if your home medicines are no longer helping with your symptoms. This information is not intended to replace advice given to you by your health care provider. Make sure you discuss any questions you have with your health care provider. Document Released: 04/29/2006 Document Revised: 02/14/2016 Document Reviewed: 02/14/2016 Elsevier Interactive Patient Education  2019 [employer]  Cough, Adult  Coughing is a reflex that clears your throat and your airways. Coughing helps to heal and protect your lungs. It is normal to cough occasionally, but a cough that happens with other symptoms or lasts a long time may be a sign of a condition that needs treatment. A cough may last only 2-3 weeks (acute), or it may last longer than 8 weeks (chronic). What are the causes? Coughing is commonly caused by:  Breathing in substances that irritate your lungs.  A viral or bacterial respiratory infection.  Allergies.  Asthma.  Postnasal drip.  Smoking.  Acid backing up from the stomach into the esophagus (gastroesophageal reflux).  Certain medicines.  Chronic lung problems, including COPD (or rarely, lung cancer).  Other medical conditions such as heart failure. Follow these instructions at home: Pay attention to any changes in your symptoms. Take these actions to help with your discomfort:  Take medicines only as told by your health care provider. ? If you were prescribed an antibiotic medicine, take it as told by your health care provider. Do not stop taking the antibiotic even if you start to feel better. ? Talk with your health care provider before you take a cough suppressant medicine.  Drink enough fluid to  keep your urine clear or pale yellow.  If the air is dry, use a cold steam vaporizer or humidifier in your bedroom or your home to help loosen secretions.  Avoid anything that causes you to cough at work or at home.  If your cough is worse at night, try sleeping in a semi-upright position.  Avoid cigarette smoke. If you smoke, quit smoking. If you need help quitting, ask your health care provider.  Avoid caffeine.  Avoid alcohol.  Rest as needed. Contact a health care provider if:  You have new symptoms.  You cough up pus.  Your cough does not get better after 2-3 weeks, or your cough gets worse.  You cannot control your cough with suppressant medicines and you are losing sleep.  You develop pain that is getting worse or pain that is not controlled with pain medicines.  You have a fever.  You have unexplained weight loss.  You have night sweats. Get help right away if:  You cough up blood.  You have difficulty breathing.  Your heartbeat is very fast. This information is not intended to replace advice given to you by your health care provider. Make sure you discuss any questions you have with your health care provider. Document Released: 07/11/2010 Document Revised: 06/20/2015 Document Reviewed: 03/21/2014 Elsevier Interactive Patient Education  2019 [employer].

## 2018-04-12 MED FILL — HYDROCHLOROTHIAZIDE 12.5 MG: 12.5 | 90 days supply | Qty: 90 | Fill #3

## 2018-04-12 MED FILL — ESCITALOPRAM 20 MG TABLET: 20 | 30 days supply | Qty: 30 | Fill #0 | Status: TO

## 2018-04-26 ENCOUNTER — Encounter: Payer: Self-pay | Admitting: Allergy and Immunology

## 2018-04-26 ENCOUNTER — Ambulatory Visit: Payer: 59 | Admitting: Allergy and Immunology

## 2018-04-26 ENCOUNTER — Other Ambulatory Visit: Payer: Self-pay

## 2018-04-26 VITALS — BP 142/78 | HR 88 | Temp 97.9°F | Resp 18

## 2018-04-26 DIAGNOSIS — J3089 Other allergic rhinitis: Secondary | ICD-10-CM | POA: Diagnosis not present

## 2018-04-26 DIAGNOSIS — J4540 Moderate persistent asthma, uncomplicated: Secondary | ICD-10-CM | POA: Diagnosis not present

## 2018-04-26 DIAGNOSIS — K219 Gastro-esophageal reflux disease without esophagitis: Secondary | ICD-10-CM | POA: Diagnosis not present

## 2018-04-26 MED ORDER — ALBUTEROL SULFATE HFA 108 (90 BASE) MCG/ACT IN AERS: 2.0000 | INHALATION_SPRAY | RESPIRATORY_TRACT | 1 refills | Status: DC | PRN

## 2018-04-26 MED ORDER — FLUTICASONE FUROATE-VILANTEROL 100-25 MCG/INH IN AEPB: INHALATION_SPRAY | RESPIRATORY_TRACT | 5 refills | Status: DC

## 2018-04-26 NOTE — Progress Notes (Signed)
[facility] - [place] [facility] [place]   Follow-up Note  Referring Provider: Elby Showers, MD Primary Provider: Elby Showers, MD Date of Office Visit: 04/26/2018  Subjective:   Jodi Bates (DOB: 06-Nov-1965) is a 53 y.o. female who returns to the [facility] [place] on 04/26/2018 in re-evaluation of the following:  HPI: [name] returns to this clinic in reevaluation of asthma and allergic rhinoconjunctivitis and LPR.  Her last visit to this clinic was 26 October 2017.  Overall she has really done well with her airway since her last visit.  Apparently on 14 March she did develop a coughing spell associated with some soreness in her chest and her coughing appeared to be precipitated by talking and she went to the urgent care center on the 15th and was treated with a short course of prednisone.  She was better within 3 days.  Other than that one event she has had no significant issues with her airway and rarely uses a short acting bronchodilator and can exercise without any problem and has not required a systemic steroid or antibiotic to treat any type of respiratory tract issue while she consistently uses Breo and occasional nasal steroid.  She has had no issues with reflux at this point in time.  She has tapered off her omeprazole.  She is very careful about consuming caffeine and chocolate consumption.  She did have the flu vaccine this year.  Allergies as of 04/26/2018      Reactions   Adhesive [tape] Itching, Other (See Comments)   Reaction:  Blisters    Latex Itching, Rash   Soap Other (See Comments)   Soaps that contain any kind of fragrance triggers an asthma attack.     Other    Fragrances      Medication List      albuterol (2.5 MG/3ML) 0.083% nebulizer solution Commonly known as:  PROVENTIL Take 3 mLs by nebulization every 6 (six) hours as needed for wheezing or shortness of breath.   albuterol 108 (90 Base) MCG/ACT inhaler  Commonly known as:  Ventolin HFA Inhale 2 puffs into the lungs every 4 (four) hours as needed for wheezing or shortness of breath.   ALPRAZolam 0.5 MG tablet Commonly known as:  Xanax Take 1 tablet (0.5 mg total) by mouth 3 (three) times daily.   amLODipine 10 MG tablet Commonly known as:  NORVASC Take 1 tablet (10 mg total) by mouth daily. Future refills go to PCP   cetirizine 10 MG tablet Commonly known as:  ZYRTEC Take 10 mg by mouth daily.   escitalopram 20 MG tablet Commonly known as:  LEXAPRO Take 20 mg by mouth at bedtime.   fluticasone 50 MCG/ACT nasal spray Commonly known as:  FLONASE Place 1 spray into both nostrils daily.   fluticasone furoate-vilanterol 100-25 MCG/INH Aepb Commonly known as:  Breo Ellipta INHALE 1 PUFF BY MOUTH DAILY.   hydrochlorothiazide 12.5 MG capsule Commonly known as:  MICROZIDE TAKE 1 CAPSULE (12.5 MG TOTAL) BY MOUTH DAILY.   losartan 100 MG tablet Commonly known as:  COZAAR TAKE 1 TABLET (100 MG TOTAL) BY MOUTH DAILY.       Past Medical History:  Diagnosis Date  . Allergy   . Anxiety   . Asthma    prn inhalers  . GERD (gastroesophageal reflux disease)   . 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 Headache(784.0)    tension  . Hypertension  under control with meds., has been on med. > 5 yr.  . Iron deficiency anemia    no current problems or meds.    Past Surgical History:  Procedure Laterality Date  . BREAST CYST EXCISION    . BREAST EXCISIONAL BIOPSY Left   . BREAST SURGERY      lt breast/ benign cyst  . CYST EXCISION     back of head  . HYSTEROSCOPY W/D&C  06/25/2004  . INCISION AND DRAINAGE PERIRECTAL ABSCESS N/A 02/10/2013   Procedure: IRRIGATION AND DEBRIDEMENT PERIRECTAL ABSCESS;  Surgeon: Gwenyth Ober, MD;  Location: [facility];  Service: General;  Laterality: N/A;  . LAPAROSCOPIC ENDOMETRIOSIS FULGURATION    . PILONIDAL CYST EXCISION N/A 04/28/2012   Procedure: exam under anesthesia  and exicision of pilonidal;  Surgeon: Gwenyth Ober, MD;  Location: [facility];  Service: General;  Laterality: N/A;  . POLYPECTOMY     uterine  . TUBAL LIGATION      Review of systems negative except as noted in HPI / PMHx or noted below:  Review of Systems  Constitutional: Negative.   HENT: Negative.   Eyes: Negative.   Respiratory: Negative.   Cardiovascular: Negative.   Gastrointestinal: Negative.   Genitourinary: Negative.   Musculoskeletal: Negative.   Skin: Negative.   Neurological: Negative.   Endo/Heme/Allergies: Negative.   Psychiatric/Behavioral: Negative.      Objective:   Vitals:   04/26/18 1033  BP: (!) 142/78  Pulse: 88  Resp: 18  Temp: 97.9 F (36.6 C)  SpO2: 98%          Physical Exam Constitutional:      Appearance: She is not diaphoretic.  HENT:     Head: Normocephalic.     Right Ear: Tympanic membrane, ear canal and external ear normal.     Left Ear: Tympanic membrane, ear canal and external ear normal.     Nose: Nose normal. No mucosal edema or rhinorrhea.     Mouth/Throat:     Pharynx: Uvula midline. No oropharyngeal exudate.  Eyes:     Conjunctiva/sclera: Conjunctivae normal.  Neck:     Thyroid: No thyromegaly.     Trachea: Trachea normal. No tracheal tenderness or tracheal deviation.  Cardiovascular:     Rate and Rhythm: Normal rate and regular rhythm.     Heart sounds: Normal heart sounds, S1 normal and S2 normal. No murmur.  Pulmonary:     Effort: No respiratory distress.     Breath sounds: Normal breath sounds. No stridor. No wheezing or rales.  Lymphadenopathy:     Head:     Right side of head: No tonsillar adenopathy.     Left side of head: No tonsillar adenopathy.     Cervical: No cervical adenopathy.  Skin:    Findings: No erythema or rash.     Nails: There is no clubbing.   Neurological:     Mental Status: She is alert.     Diagnostics:    Spirometry was performed and demonstrated an FEV1 of 1.79  at 78 % of predicted.  Assessment and Plan:   1. Asthma, moderate persistent, well-controlled   2. Other allergic rhinitis   3. LPRD (laryngopharyngeal reflux disease)     1. Continue to Treat and prevent inflammation:   A. Continue Rhinocort one spray each nostril 3-7 times per week  B. Continue Breo 100 one inhalation 1 time per day  2. Continue to Treat and prevent reflux:   A.  continue to eliminate all caffeine and chocolate consumption    B. RESTART omeprazole 40 mg 3-7 times per week IF NEEDED  3. If needed:   A. Ventolin HFA 2 puffs every 4-6 hours  B. OTC antihistamine - Claritin/Allegra/Zyrtec  4. Return to clinic in 6 months or earlier if problem  Teegan appears to be doing very well on her current plan.  She has a very good understanding about her disease state and how her medications work and the appropriate dosing of her medications.  I will see her back in this clinic in 6 months or earlier if there is a problem.  Allena Katz, MD Allergy / Immunology [place]

## 2018-04-26 NOTE — Patient Instructions (Addendum)
  1. Continue to Treat and prevent inflammation:   A. Continue Rhinocort one spray each nostril 3-7 times per week  B. Continue Breo 100 one inhalation 1 time per day  2. Continue to Treat and prevent reflux:   A. continue to eliminate all caffeine and chocolate consumption    B. RESTART omeprazole 40 mg 3-7 times per week IF NEEDED  3. If needed:   A. Ventolin HFA 2 puffs every 4-6 hours  B. OTC antihistamine - Claritin/Allegra/Zyrtec  4. Return to clinic in 6 months or earlier if problem

## 2018-04-27 ENCOUNTER — Encounter: Payer: Self-pay | Admitting: Allergy and Immunology

## 2018-05-02 ENCOUNTER — Other Ambulatory Visit: Payer: Self-pay | Admitting: Cardiovascular Disease

## 2018-05-02 MED FILL — ESCITALOPRAM 20 MG TABLET: 20 | 30 days supply | Qty: 30 | Fill #0

## 2018-05-02 MED FILL — AMLODIPINE BESYLATE 10 MG T: 10 | 90 days supply | Qty: 90 | Fill #0

## 2018-05-02 MED FILL — BREO ELLIPTA 100-25 MCG INH: 100-25 | 30 days supply | Qty: 60 | Fill #0

## 2018-05-16 ENCOUNTER — Encounter: Payer: Self-pay | Admitting: *Deleted

## 2018-05-16 ENCOUNTER — Telehealth: Payer: Self-pay | Admitting: Allergy and Immunology

## 2018-05-16 NOTE — Telephone Encounter (Signed)
Please provide patient a letter that states that because of her pre-existing respiratory tract disease she is at risk of severe COVID associated respiratory track disease and.. find out what she actually wants in the letter.  Does she want to not go to work?  Does she want specific changes made at work to minimize her exposure to [place]?

## 2018-05-16 NOTE — Telephone Encounter (Signed)
Patient just needs a not stating that she should not be in direct contact with patient's who have suspected or confirmed cases of COVID because her job continues to try and place her with these patient's when she has explained that she is at higher risk.

## 2018-05-16 NOTE — Telephone Encounter (Signed)
Patient is wondering if she can have a letter for her employer Discussing her asthma and due to [place] that she is at risk to work Please call patient to answer any questions

## 2018-05-16 NOTE — Telephone Encounter (Signed)
OK, please provide note.

## 2018-05-16 NOTE — Telephone Encounter (Signed)
Please advise 

## 2018-05-17 ENCOUNTER — Encounter: Payer: Self-pay | Admitting: *Deleted

## 2018-05-17 NOTE — Telephone Encounter (Signed)
Letter placed up front for patient to pick up. Patient aware.

## 2018-06-04 ENCOUNTER — Other Ambulatory Visit: Payer: Self-pay | Admitting: Cardiovascular Disease

## 2018-06-06 MED FILL — ESCITALOPRAM 20 MG TABLET: 20 | 30 days supply | Qty: 30 | Fill #0

## 2018-06-06 MED FILL — LOSARTAN POTASSIUM 100 MG T: 100 | 30 days supply | Qty: 30 | Fill #0

## 2018-06-07 DIAGNOSIS — H52203 Unspecified astigmatism, bilateral: Secondary | ICD-10-CM | POA: Diagnosis not present

## 2018-06-07 DIAGNOSIS — H524 Presbyopia: Secondary | ICD-10-CM | POA: Diagnosis not present

## 2018-06-07 DIAGNOSIS — H40013 Open angle with borderline findings, low risk, bilateral: Secondary | ICD-10-CM | POA: Diagnosis not present

## 2018-06-07 DIAGNOSIS — H04123 Dry eye syndrome of bilateral lacrimal glands: Secondary | ICD-10-CM | POA: Diagnosis not present

## 2018-06-07 DIAGNOSIS — H5213 Myopia, bilateral: Secondary | ICD-10-CM | POA: Diagnosis not present

## 2018-06-16 DIAGNOSIS — Z01419 Encounter for gynecological examination (general) (routine) without abnormal findings: Secondary | ICD-10-CM | POA: Diagnosis not present

## 2018-06-16 DIAGNOSIS — Z808 Family history of malignant neoplasm of other organs or systems: Secondary | ICD-10-CM | POA: Diagnosis not present

## 2018-06-16 DIAGNOSIS — L732 Hidradenitis suppurativa: Secondary | ICD-10-CM | POA: Diagnosis not present

## 2018-06-16 DIAGNOSIS — Z803 Family history of malignant neoplasm of breast: Secondary | ICD-10-CM | POA: Diagnosis not present

## 2018-06-16 DIAGNOSIS — Z6841 Body Mass Index (BMI) 40.0 and over, adult: Secondary | ICD-10-CM | POA: Diagnosis not present

## 2018-06-16 MED FILL — BREO ELLIPTA 100-25 MCG INH: 100-25 | 30 days supply | Qty: 60 | Fill #1

## 2018-06-16 MED FILL — TETRACYCLINE HCL 250 MG CAP: 250 | 30 days supply | Qty: 30 | Fill #0

## 2018-07-06 MED FILL — LOSARTAN POTASSIUM 100 MG T: 100 | 30 days supply | Qty: 30 | Fill #1

## 2018-07-12 ENCOUNTER — Other Ambulatory Visit: Payer: Self-pay | Admitting: Cardiovascular Disease

## 2018-07-12 MED FILL — ESCITALOPRAM 20 MG TABLET: 20 | 30 days supply | Qty: 30 | Fill #0

## 2018-07-12 MED FILL — HYDROCHLOROTHIAZIDE 12.5 MG: 12.5 | 90 days supply | Qty: 90 | Fill #0

## 2018-07-13 ENCOUNTER — Other Ambulatory Visit: Payer: Self-pay | Admitting: Cardiovascular Disease

## 2018-07-13 ENCOUNTER — Telehealth: Payer: Self-pay | Admitting: Physician Assistant

## 2018-07-13 MED ORDER — HYDROCHLOROTHIAZIDE 12.5 MG PO CAPS: 12.5000 mg | ORAL_CAPSULE | Freq: Every day | ORAL | 0 refills | Status: DC

## 2018-07-13 NOTE — Telephone Encounter (Signed)
°*  STAT* If patient is at the pharmacy, call can be transferred to refill team.   1. Which medications need to be refilled? (please list name of each medication and dose if known)  hydrochlorothiazide (MICROZIDE) 12.5 MG capsule  2. Which pharmacy/location (including street and city if local pharmacy) is medication to be sent to?  [facility], [place].   3. Do they need a 30 day or 90 day supply? 30  Patient said the pharmacy would not refill this medication, and her request is denied. Will set patient up for a virtual visit with Dr. Margaretann Loveless or an APP

## 2018-07-13 NOTE — Telephone Encounter (Signed)
smartphone/ my chart/ consent/ pre reg completed  °

## 2018-07-18 NOTE — Progress Notes (Signed)
Virtual Visit via Video Note   This visit type was conducted due to national recommendations for restrictions regarding the COVID-19 Pandemic (e.g. social distancing) in an effort to limit this patient's exposure and mitigate transmission in our community.  Due to her co-morbid illnesses, this patient is at least at moderate risk for complications without adequate follow up.  This format is felt to be most appropriate for this patient at this time.  All issues noted in this document were discussed and addressed.  A limited physical exam was performed with this format.  Please refer to the patient's chart for her consent to telehealth for [facility].   Date:  07/19/2018   ID:  Verl Dicker, DOB [birth year], MRN [id number]  Patient Location: Home Provider Location: Office  PCP:  Elby Showers, MD  Cardiologist:  [name], MD  Electrophysiologist:  None   Evaluation Performed:  Follow-Up Visit  Chief Complaint:  Follow up, HTN  History of Present Illness:    Jodi Bates is a 53 y.o. female with hypertension, bradycardia and near syncope, and asthma. 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 HR found to be in the 40s and she was taking nebivolol. BB D/C'ed and she has not had a recurrence of near syncope. HTN has been controlled with HCTZ, losartan, and amlodipine 10 mg. She was last seen in clinic with Dr. Oval Linsey 10/13/16 and was doing well at that time. She was working on exercise and meditation. She noted that amlodipine makes her sleepy, but she has adjusted the schedule of her medications.   She presents today for follow up. Pressures generally well-controlled. Her pressure is elevated this morning because she did not take her norvasc last night. Norvasc makes her sleepy and she was keeping her 18 month old granddaughter last evening and wanted to be alert in case she woke. Her pressures have been well-controlled. She  denies bradycardia and syncope. HR is generally in the 60s. She does report weight gain that she thinks is related to menopausal changes. She has lost 10 lbs in the last month using Weight Watchers. She is also doing video aerobics.    The patient does not have symptoms concerning for COVID-19 infection (fever, chills, cough, or new shortness of breath).    Past Medical History:  Diagnosis Date  . Allergy   . Anxiety   . Asthma    prn inhalers  . GERD (gastroesophageal reflux disease)   . 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 Headache(784.0)    tension  . Hypertension    under control with meds., has been on med. > 5 yr.  . Iron deficiency anemia    no current problems or meds.   Past Surgical History:  Procedure Laterality Date  . BREAST CYST EXCISION    . BREAST EXCISIONAL BIOPSY Left   . BREAST SURGERY      lt breast/ benign cyst  . CYST EXCISION     back of head  . HYSTEROSCOPY W/D&C  06/25/2004  . INCISION AND DRAINAGE PERIRECTAL ABSCESS N/A 02/10/2013   Procedure: IRRIGATION AND DEBRIDEMENT PERIRECTAL ABSCESS;  Surgeon: Gwenyth Ober, MD;  Location: [facility];  Service: General;  Laterality: N/A;  . LAPAROSCOPIC ENDOMETRIOSIS FULGURATION    . PILONIDAL CYST EXCISION N/A 04/28/2012   Procedure: exam under anesthesia and exicision of pilonidal;  Surgeon: Gwenyth Ober, MD;  Location: [facility];  Service: General;  Laterality: N/A;  . POLYPECTOMY     uterine  . TUBAL LIGATION       Current Meds  Medication Sig  . albuterol (PROVENTIL) (2.5 MG/3ML) 0.083% nebulizer solution Take 3 mLs by nebulization every 6 (six) hours as needed for wheezing or shortness of breath.   Marland Kitchen albuterol (VENTOLIN HFA) 108 (90 Base) MCG/ACT inhaler Inhale 2 puffs into the lungs every 4 (four) hours as needed for wheezing or shortness of breath.  . ALPRAZolam (XANAX) 0.5 MG tablet Take 1 tablet (0.5 mg total) by mouth 3 (three) times daily.  Marland Kitchen amLODipine  (NORVASC) 10 MG tablet Take 1 tablet (10 mg total) by mouth daily.  . cetirizine (ZYRTEC) 10 MG tablet Take 10 mg by mouth daily.  Marland Kitchen escitalopram (LEXAPRO) 20 MG tablet Take 20 mg by mouth at bedtime.   . fluticasone (FLONASE) 50 MCG/ACT nasal spray Place 1 spray into both nostrils daily.  . fluticasone furoate-vilanterol (BREO ELLIPTA) 100-25 MCG/INH AEPB INHALE 1 PUFF BY MOUTH DAILY.  . hydrochlorothiazide (MICROZIDE) 12.5 MG capsule Take 1 capsule (12.5 mg total) by mouth daily.  Marland Kitchen losartan (COZAAR) 100 MG tablet Take 1 tablet (100 mg total) by mouth daily.  . [DISCONTINUED] amLODipine (NORVASC) 10 MG tablet TAKE 1 TABLET BY MOUTH DAILY. FUTURE REFILLS GO TO PCP  . [DISCONTINUED] hydrochlorothiazide (MICROZIDE) 12.5 MG capsule Take 1 capsule (12.5 mg total) by mouth daily.  . [DISCONTINUED] losartan (COZAAR) 100 MG tablet TAKE 1 TABLET BY MOUTH DAILY.   Current Facility-Administered Medications for the 07/19/18 encounter (Telemedicine) with Ledora Bottcher, PA  Medication  . 0.9 %  sodium chloride infusion     Allergies:   Adhesive [tape], Latex, Soap, and Other   Social History   Tobacco Use  . Smoking status: Never Smoker  . Smokeless tobacco: Never Used  Substance Use Topics  . Alcohol use: No  . Drug use: No     Family Hx: The patient's family history includes Allergic rhinitis in her mother and son; Asthma in her brother and son; Breast cancer in her maternal grandmother; Cancer in her father and maternal grandmother; Diabetes in her brother and mother; Esophageal cancer in her father; Heart failure in her mother; Hypertension in her mother; Pancreatic cancer in her brother; Sleep apnea in her brother. There is no history of Colon cancer, Rectal cancer, Stomach cancer, or Colon polyps.  ROS:   Please see the history of present illness.     All other systems reviewed and are negative.   Prior CV studies:   The following studies were reviewed today:  none   Labs/Other Tests and Data Reviewed:    EKG:  An ECG dated 04/09/17 was personally reviewed today and demonstrated:  NSR, HR 84, poor R wave progression 2/lead placement  Recent Labs: No results found for requested labs within last 8760 hours.   Recent Lipid Panel Lab Results  Component Value Date/Time   CHOL 184 03/25/2015 09:05 AM   TRIG 81 03/25/2015 09:05 AM   HDL 58 03/25/2015 09:05 AM   CHOLHDL 3.2 03/25/2015 09:05 AM   LDLCALC 110 03/25/2015 09:05 AM    Wt Readings from Last 3 Encounters:  07/19/18 237 lb (107.5 kg)  04/10/18 242 lb (109.8 kg)  03/02/17 241 lb (109.3 kg)     Objective:    Vital Signs:  BP (!) 118/92   Pulse 62   Ht 5\' 4"  (1.626 m)   Wt 237 lb (  107.5 kg)   BMI 40.68 kg/m    VITAL SIGNS:  reviewed GEN:  no acute distress EYES:  sclerae anicteric, EOMI - Extraocular Movements Intact RESPIRATORY:  normal respiratory effort, symmetric expansion CARDIOVASCULAR:  no peripheral edema SKIN:  no rash, lesions or ulcers. MUSCULOSKELETAL:  no obvious deformities. NEURO:  alert and oriented x 3, no obvious focal deficit PSYCH:  normal affect  ASSESSMENT & PLAN:     Hypertension - HCTZ, losartan, norvasc - pressures have been well-controlled at home - no medication changes   Symptomatic bradycardia Near syncope - no further episodes since BB D/C'ed - HR generally in the 60s   Obesity, menopausal-related weight gain She is working on diet and exercise - has lost 10 lbs over the last month using weight watchers and video aerobics. I congratulated her on her success.    COVID-19 Education: The signs and symptoms of COVID-19 were discussed with the patient and how to seek care for testing (follow up with PCP or arrange E-visit).  The importance of social distancing was discussed today.  Time:   Today, I have spent 15 minutes with the patient with telehealth technology discussing the above problems.     Medication Adjustments/Labs and Tests  Ordered: Current medicines are reviewed at length with the patient today.  Concerns regarding medicines are outlined above.   Tests Ordered: No orders of the defined types were placed in this encounter.   Medication Changes: Meds ordered this encounter  Medications  . amLODipine (NORVASC) 10 MG tablet    Sig: Take 1 tablet (10 mg total) by mouth daily.    Dispense:  90 tablet    Refill:  3    please send new rx to [facility], [facility] to [facility]  . losartan (COZAAR) 100 MG tablet    Sig: Take 1 tablet (100 mg total) by mouth daily.    Dispense:  90 tablet    Refill:  3    PLEASE SCHEDULE AN APPT FOR FUTURE REFILLS  . hydrochlorothiazide (MICROZIDE) 12.5 MG capsule    Sig: Take 1 capsule (12.5 mg total) by mouth daily.    Dispense:  90 capsule    Refill:  3    Follow Up:  Virtual Visit or In Person in 1 year(s)  Signed, Marigny Borre, PA  07/19/2018 8:55 AM    [place]

## 2018-07-19 ENCOUNTER — Encounter: Payer: Self-pay | Admitting: Physician Assistant

## 2018-07-19 ENCOUNTER — Other Ambulatory Visit: Payer: Self-pay

## 2018-07-19 ENCOUNTER — Telehealth (INDEPENDENT_AMBULATORY_CARE_PROVIDER_SITE_OTHER): Payer: 59 | Admitting: Physician Assistant

## 2018-07-19 VITALS — BP 118/92 | HR 62 | Ht 64.0 in | Wt 237.0 lb

## 2018-07-19 DIAGNOSIS — R001 Bradycardia, unspecified: Secondary | ICD-10-CM

## 2018-07-19 DIAGNOSIS — R55 Syncope and collapse: Secondary | ICD-10-CM

## 2018-07-19 DIAGNOSIS — I10 Essential (primary) hypertension: Secondary | ICD-10-CM | POA: Diagnosis not present

## 2018-07-19 MED ORDER — AMLODIPINE BESYLATE 10 MG PO TABS: 10.0000 mg | ORAL_TABLET | Freq: Every day | ORAL | 3 refills | Status: DC

## 2018-07-19 MED ORDER — LOSARTAN POTASSIUM 100 MG PO TABS: 100.0000 mg | ORAL_TABLET | Freq: Every day | ORAL | 3 refills | Status: DC

## 2018-07-19 MED ORDER — HYDROCHLOROTHIAZIDE 12.5 MG PO CAPS: 12.5000 mg | ORAL_CAPSULE | Freq: Every day | ORAL | 3 refills | Status: DC

## 2018-07-19 MED FILL — AMLODIPINE BESYLATE 10 MG T: 10 | 90 days supply | Qty: 90 | Fill #0

## 2018-07-19 NOTE — Patient Instructions (Signed)
Medication Instructions:  Your physician recommends that you continue on your current medications as directed. Please refer to the Current Medication list given to you today.  If you need a refill on your cardiac medications before your next appointment, please call your pharmacy.    Follow-Up: At [facility], you and your health needs are our priority.  As part of our continuing mission to provide you with exceptional heart care, we have created designated Provider Care Teams.  These Care Teams include your primary Cardiologist (physician) and Advanced Practice Providers (APPs -  Physician Assistants and Nurse Practitioners) who all work together to provide you with the care you need, when you need it. You will need a follow up appointment in 12 months.  Please call our office 2 months in advance to schedule this appointment.  You may see [name], MD or one of the following Advanced Practice Providers on your designated Care Team:   Kerin Ransom, PA-C Roby Lofts, [place] . Sande Rives, PA-C

## 2018-07-26 MED FILL — TETRACYCLINE HCL 250 MG CAP: 250 | 30 days supply | Qty: 30 | Fill #1

## 2018-08-02 MED FILL — LOSARTAN POTASSIUM 100 MG T: 100 | 30 days supply | Qty: 30 | Fill #2

## 2018-08-05 MED FILL — ESCITALOPRAM 20 MG TABLET: 20 | 30 days supply | Qty: 30 | Fill #1

## 2018-08-05 MED FILL — BREO ELLIPTA 100-25 MCG INH: 100-25 | 30 days supply | Qty: 60 | Fill #0

## 2018-08-31 MED FILL — LOSARTAN POTASSIUM 100 MG T: 100 | 30 days supply | Qty: 30 | Fill #0

## 2018-08-31 MED FILL — TETRACYCLINE 250 MG CAPSULE: 250 | 30 days supply | Qty: 30 | Fill #0

## 2018-08-31 MED FILL — ESCITALOPRAM 20 MG TABLET: 20 | 30 days supply | Qty: 30 | Fill #2

## 2018-09-06 DIAGNOSIS — H40013 Open angle with borderline findings, low risk, bilateral: Secondary | ICD-10-CM | POA: Diagnosis not present

## 2018-09-06 DIAGNOSIS — H04123 Dry eye syndrome of bilateral lacrimal glands: Secondary | ICD-10-CM | POA: Diagnosis not present

## 2018-09-22 MED FILL — HYDROCHLOROTHIAZIDE 12.5 MG: 12.5 | 90 days supply | Qty: 90 | Fill #0

## 2018-09-23 IMAGING — MG DIGITAL SCREENING BILATERAL MAMMOGRAM WITH TOMO AND CAD
8 series · 8 of 24 positions shown · non-contrast
Comparison: Previous exam(s).

CLINICAL DATA: Screening.

EXAM:
DIGITAL SCREENING BILATERAL MAMMOGRAM WITH TOMO AND CAD

[L MLO synth-2D]
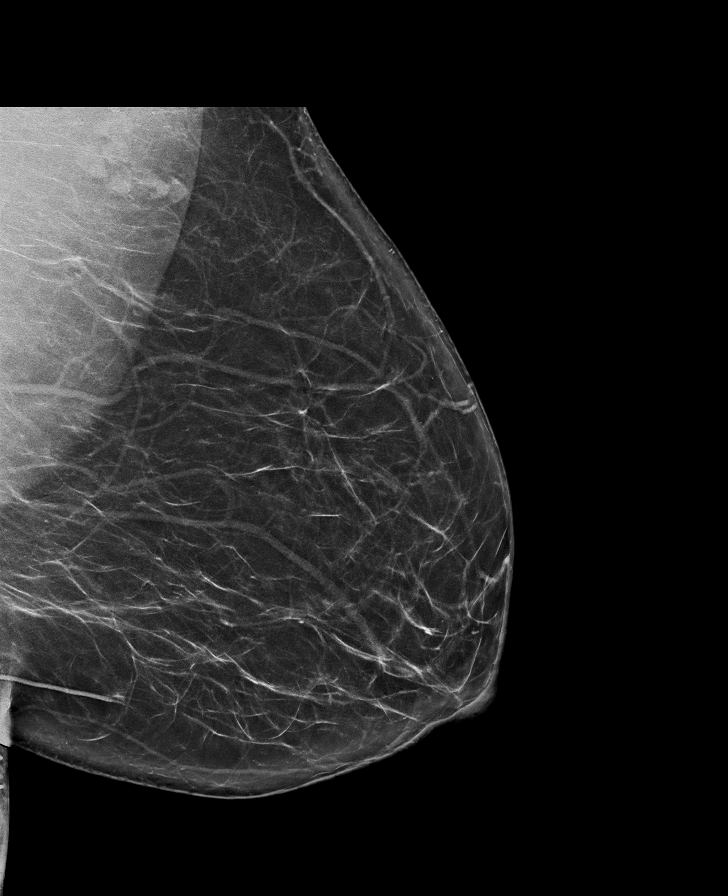

[R MLO synth-2D]
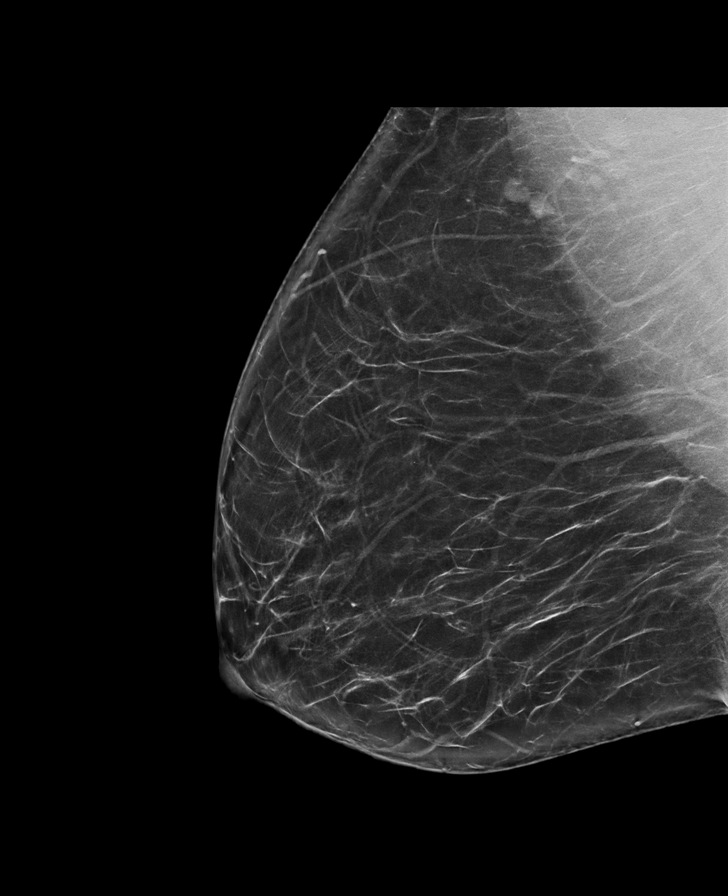

[R CC synth-2D]
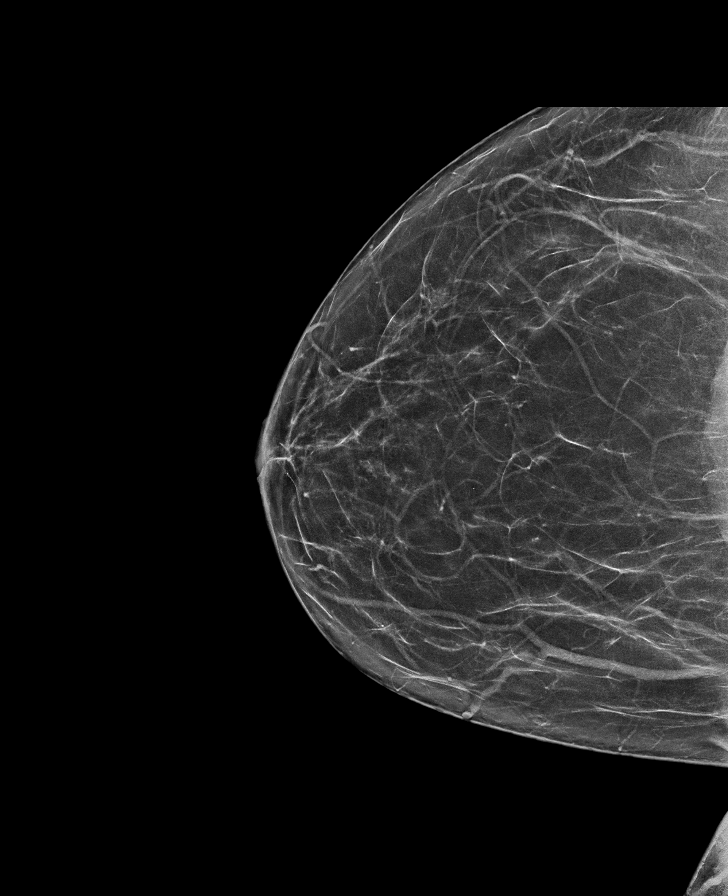

[L CC synth-2D]
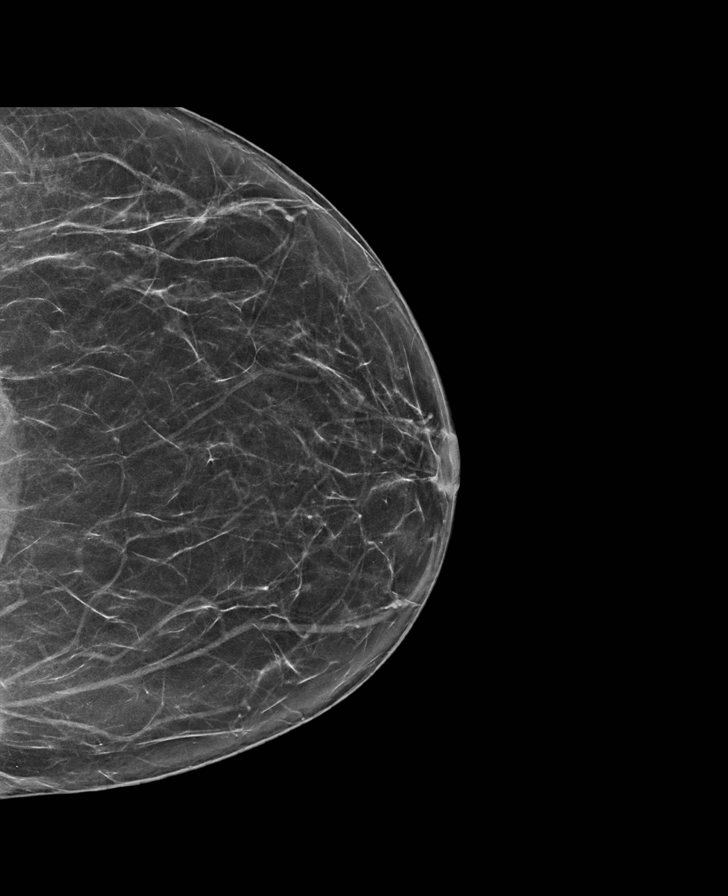

[R MLO tomo · tomo slice 43/85.0]
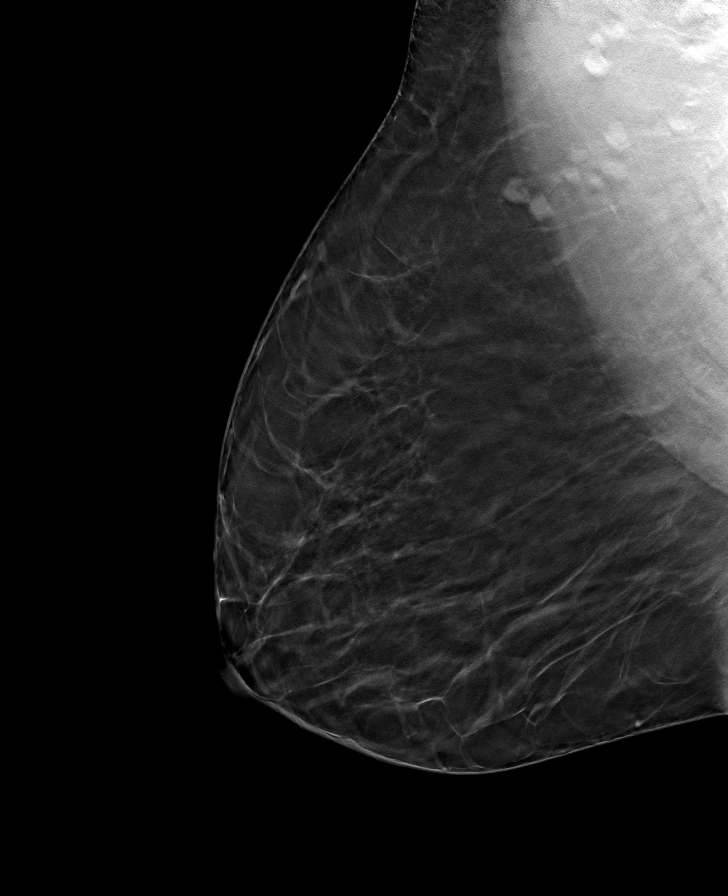

[R CC tomo · tomo slice 35/70.0]
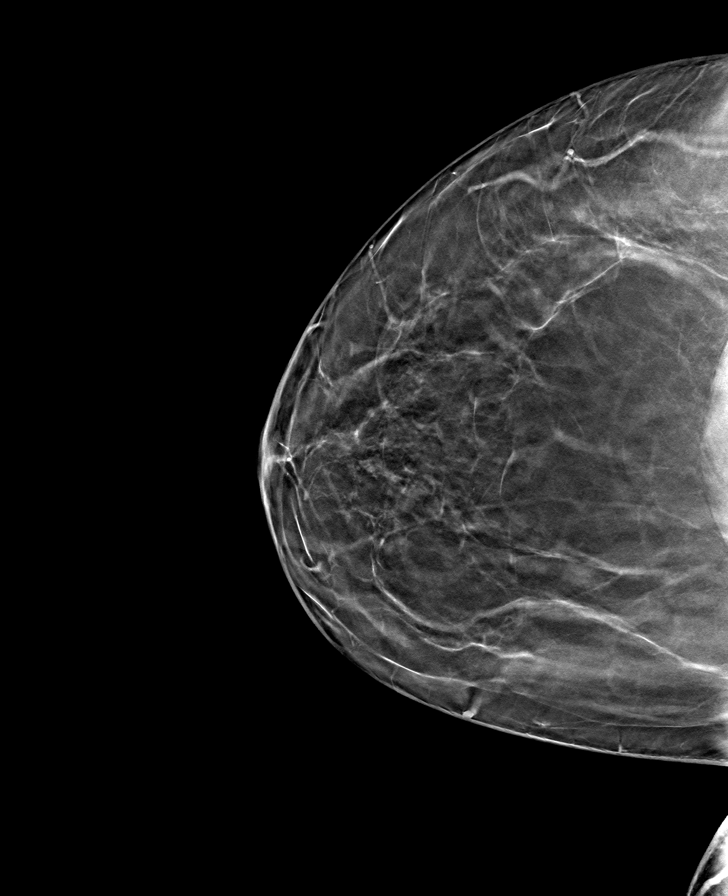

[L CC tomo · tomo slice 35/69.0]
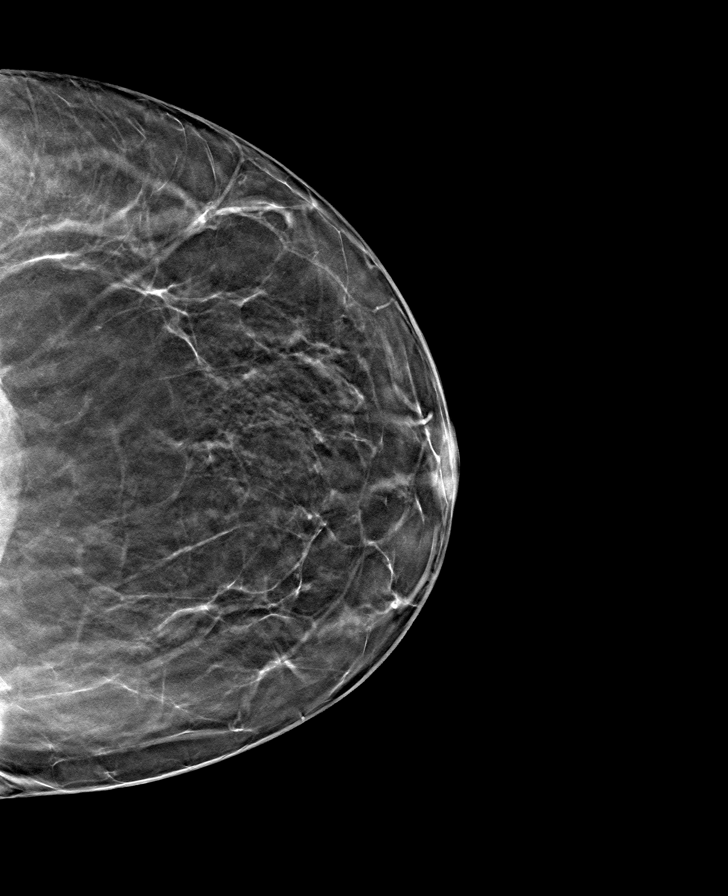

[L MLO tomo · tomo slice 43/85.0]
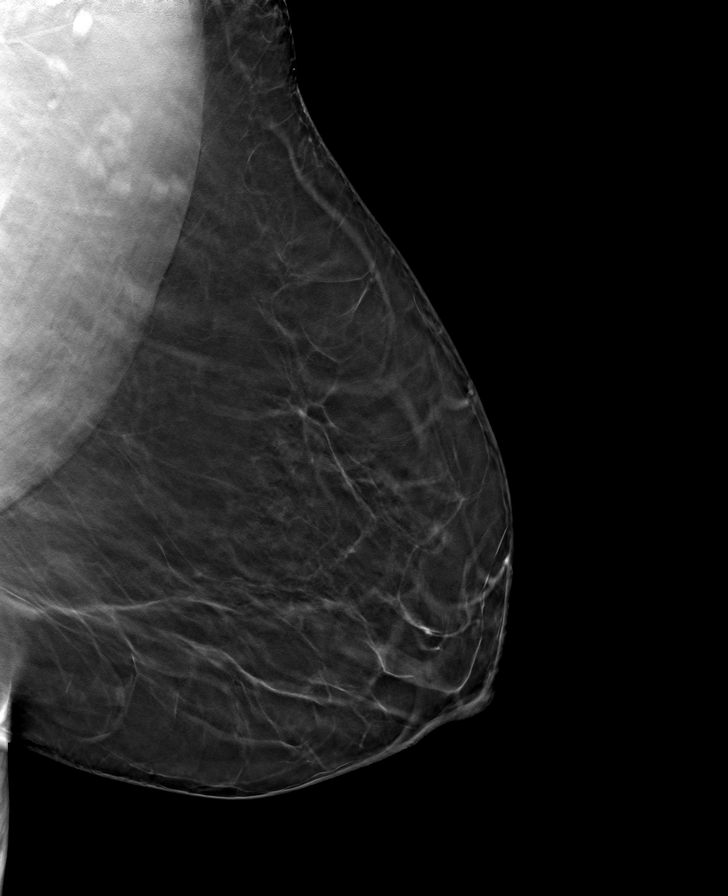

[8 of 24 positions shown; findings below may reference images not displayed]

ACR Breast Density Category b: There are scattered areas of
fibroglandular density.
FINDINGS: There are no findings suspicious for malignancy. Images were
processed with CAD.
IMPRESSION: No mammographic evidence of malignancy. A result letter of this
screening mammogram will be mailed directly to the patient.

RECOMMENDATION:
Screening mammogram in one year. (Code:[id number])

BI-RADS CATEGORY  1: Negative.

## 2018-10-05 MED FILL — LOSARTAN POTASSIUM 100 MG T: 100 | 30 days supply | Qty: 30 | Fill #1

## 2018-10-10 MED FILL — HYDROCHLOROTHIAZIDE 12.5 MG: 12.5 | 90 days supply | Qty: 90 | Fill #0

## 2018-10-17 MED FILL — ALBUTEROL SULFATE HFA 108 (: 108 (90 BAS | 17 days supply | Qty: 18 | Fill #0

## 2018-10-17 MED FILL — BREO ELLIPTA 100-25 MCG INH: 100-25 | 30 days supply | Qty: 60 | Fill #1

## 2018-10-20 MED FILL — ESCITALOPRAM 20 MG TABLET: 20 | 30 days supply | Qty: 30 | Fill #3

## 2018-10-25 ENCOUNTER — Telehealth: Payer: Self-pay | Admitting: Internal Medicine

## 2018-10-25 ENCOUNTER — Encounter: Payer: Self-pay | Admitting: Internal Medicine

## 2018-10-25 ENCOUNTER — Ambulatory Visit (INDEPENDENT_AMBULATORY_CARE_PROVIDER_SITE_OTHER): Payer: 59 | Admitting: Allergy and Immunology

## 2018-10-25 ENCOUNTER — Other Ambulatory Visit: Payer: Self-pay

## 2018-10-25 ENCOUNTER — Encounter: Payer: Self-pay | Admitting: Allergy and Immunology

## 2018-10-25 VITALS — BP 124/76 | HR 60 | Temp 97.2°F | Resp 18 | Ht 64.0 in | Wt 223.0 lb

## 2018-10-25 DIAGNOSIS — F4321 Adjustment disorder with depressed mood: Secondary | ICD-10-CM

## 2018-10-25 DIAGNOSIS — J3089 Other allergic rhinitis: Secondary | ICD-10-CM

## 2018-10-25 DIAGNOSIS — J4540 Moderate persistent asthma, uncomplicated: Secondary | ICD-10-CM | POA: Diagnosis not present

## 2018-10-25 DIAGNOSIS — K219 Gastro-esophageal reflux disease without esophagitis: Secondary | ICD-10-CM | POA: Diagnosis not present

## 2018-10-25 MED ORDER — ALBUTEROL SULFATE HFA 108 (90 BASE) MCG/ACT IN AERS: 2.0000 | INHALATION_SPRAY | RESPIRATORY_TRACT | 1 refills | Status: DC | PRN

## 2018-10-25 MED ORDER — ALPRAZOLAM 0.5 MG PO TABS: 0.5000 mg | ORAL_TABLET | Freq: Three times a day (TID) | ORAL | 0 refills | Status: DC

## 2018-10-25 MED ORDER — BREO ELLIPTA 100-25 MCG/INH IN AEPB: INHALATION_SPRAY | RESPIRATORY_TRACT | 5 refills | Status: DC

## 2018-10-25 NOTE — Telephone Encounter (Signed)
I have e-scribed to [facility] pharmacy. We are sorry for her loss.

## 2018-10-25 NOTE — Telephone Encounter (Signed)
See request for Xanax refill due to mother's passing. This has been refilled. She called saying mother had passed away. She spoke with Liane Comber and asked for refill on Xanax. We will not require OV today due to bereavement.

## 2018-10-25 NOTE — Telephone Encounter (Signed)
Pt mother passed away Sunday and pt wanted to know if she could get her Xanax filled, Does she need and appt and if so when and what kind?

## 2018-10-25 NOTE — Telephone Encounter (Signed)
Left detailed message.   

## 2018-10-25 NOTE — Progress Notes (Signed)
Jodi Bates - Jodi Bates - Jodi Bates   Follow-up Note  Referring Provider: Elby Showers, MD Primary Provider: Elby Showers, MD Date of Office Visit: 10/25/2018  Subjective:   Jodi Bates (DOB: 02/03/1965) is a 53 y.o. female who returns [facility] on 10/25/2018 in re-evaluation of the following:  HPI: Jodi Bates returns to this clinic in reevaluation of asthma and allergic rhinoconjunctivitis and LPR.  Her last visit to this clinic was 05/13/2018.  Jodi Bates has had a very difficult time as her mom passed away the May 13, 2022 after prolonged illness.  She appeared to be the primary caregiver for her mom over the course of the past 6 months since her mom moved down from [place].  Her asthma has not been an issue while using Breo on a regular basis.  She rarely uses a short acting bronchodilator and she has not required a systemic steroid to treat an exacerbation.  She really has very little problems with her upper airways while intermittently and rarely using a nasal steroid.  She has not required an antibiotic to treat an episode of sinusitis.  Her reflux is under very good control as is her throat problem.  She is very careful about caffeine and chocolate consumption.  She intermittently and rarely uses omeprazole.  Allergies as of 10/25/2018      Reactions   Adhesive [tape] Itching, Other (See Comments)   Reaction:  Blisters    Latex Itching, Rash   Soap Other (See Comments)   Soaps that contain any kind of fragrance triggers an asthma attack.     Other    Fragrances      Medication List      albuterol (2.5 MG/3ML) 0.083% nebulizer solution Commonly known as: PROVENTIL Take 3 mLs by nebulization every 6 (six) hours as needed for wheezing or shortness of breath.   albuterol 108 (90 Base) MCG/ACT inhaler Commonly known as: Ventolin HFA Inhale 2 puffs into the lungs every 4 (four) hours as needed for wheezing or  shortness of breath.   ALPRAZolam 0.5 MG tablet Commonly known as: Xanax Take 1 tablet (0.5 mg total) by mouth 3 (three) times daily.   amLODipine 10 MG tablet Commonly known as: NORVASC Take 1 tablet (10 mg total) by mouth daily.   cetirizine 10 MG tablet Commonly known as: ZYRTEC Take 10 mg by mouth daily.   escitalopram 20 MG tablet Commonly known as: LEXAPRO Take 20 mg by mouth at bedtime.   fluticasone 50 MCG/ACT nasal spray Commonly known as: FLONASE Place 1 spray into both nostrils daily.   fluticasone furoate-vilanterol 100-25 MCG/INH Aepb Commonly known as: Breo Ellipta INHALE 1 PUFF BY MOUTH DAILY.   hydrochlorothiazide 12.5 MG capsule Commonly known as: MICROZIDE Take 1 capsule (12.5 mg total) by mouth daily.   losartan 100 MG tablet Commonly known as: COZAAR Take 1 tablet (100 mg total) by mouth daily.       Past Medical History:  Diagnosis Date  . Allergy   . Anxiety   . Asthma    prn inhalers  . GERD (gastroesophageal reflux disease)   . 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 Headache(784.0)    tension  . Hypertension    under control with meds., has been on med. > 5 yr.  . Iron deficiency anemia    no current problems or meds.    Past Surgical History:  Procedure  Laterality Date  . BREAST CYST EXCISION    . BREAST EXCISIONAL BIOPSY Left   . BREAST SURGERY      lt breast/ benign cyst  . CYST EXCISION     back of head  . HYSTEROSCOPY W/D&C  06/25/2004  . INCISION AND DRAINAGE PERIRECTAL ABSCESS N/A 02/10/2013   Procedure: IRRIGATION AND DEBRIDEMENT PERIRECTAL ABSCESS;  Surgeon: Gwenyth Ober, MD;  Location: [place];  Service: General;  Laterality: N/A;  . LAPAROSCOPIC ENDOMETRIOSIS FULGURATION    . PILONIDAL CYST EXCISION N/A 04/28/2012   Procedure: exam under anesthesia and exicision of pilonidal;  Surgeon: Gwenyth Ober, MD;  Location: [place];  Service: General;  Laterality: N/A;  . POLYPECTOMY      uterine  . TUBAL LIGATION      Review of systems negative except as noted in HPI / PMHx or noted below:  Review of Systems  Constitutional: Negative.   HENT: Negative.   Eyes: Negative.   Respiratory: Negative.   Cardiovascular: Negative.   Gastrointestinal: Negative.   Genitourinary: Negative.   Musculoskeletal: Negative.   Skin: Negative.   Neurological: Negative.   Endo/Heme/Allergies: Negative.   Psychiatric/Behavioral: Negative.      Objective:   Vitals:   10/25/18 1051  BP: 124/76  Pulse: 60  Resp: 18  Temp: (!) 97.2 F (36.2 C)  SpO2: 98%   Height: 5\' 4"  (162.6 cm)  Weight: 223 lb (101.2 kg)   Physical Exam Constitutional:      Appearance: She is not diaphoretic.  HENT:     Head: Normocephalic.     Right Ear: Tympanic membrane, ear canal and external ear normal.     Left Ear: Tympanic membrane, ear canal and external ear normal.     Nose: Nose normal. No mucosal edema or rhinorrhea.     Mouth/Throat:     Pharynx: Uvula midline. No oropharyngeal exudate.  Eyes:     Conjunctiva/sclera: Conjunctivae normal.  Neck:     Thyroid: No thyromegaly.     Trachea: Trachea normal. No tracheal tenderness or tracheal deviation.  Cardiovascular:     Rate and Rhythm: Normal rate and regular rhythm.     Heart sounds: Normal heart sounds, S1 normal and S2 normal. No murmur.  Pulmonary:     Effort: No respiratory distress.     Breath sounds: Normal breath sounds. No stridor. No wheezing or rales.  Lymphadenopathy:     Head:     Right side of head: No tonsillar adenopathy.     Left side of head: No tonsillar adenopathy.     Cervical: No cervical adenopathy.  Skin:    Findings: No erythema or rash.     Nails: There is no clubbing.   Neurological:     Mental Status: She is alert.     Diagnostics:    Spirometry was not performed  The patient had an Asthma Control Test with the following results: ACT Total Score: 22.    Assessment and Plan:   1.  Asthma, moderate persistent, well-controlled   2. Other allergic rhinitis   3. LPRD (laryngopharyngeal reflux disease)     1. Continue to Treat and prevent inflammation:   A. Continue Rhinocort one spray each nostril 3-7 times per week  B. Continue Breo 100 one inhalation 1 time per day  2. Continue to Treat and prevent reflux:   A. continue to eliminate all caffeine and chocolate consumption    B. omeprazole 40 mg 3-7 times per  week if needed  3. If needed:   A. Ventolin HFA 2 puffs every 4-6 hours  B. OTC antihistamine - Claritin/Allegra/Zyrtec  4. Return to clinic in 6 months or earlier if problem  5.  Obtain COVID vaccination when available  Yaribeth appears to be doing relatively well from a respiratory standpoint on her current medical therapy and she will continue to use anti-inflammatory agents for her airway and therapy directed against reflux as noted above.  Assuming she does well I will see her back in this clinic in 6 months or earlier if there is a problem.  Allena Katz, MD Allergy / Immunology [place]

## 2018-10-25 NOTE — Patient Instructions (Addendum)
  1. Continue to Treat and prevent inflammation:   A. Continue Rhinocort one spray each nostril 3-7 times per week  B. Continue Breo 100 one inhalation 1 time per day  2. Continue to Treat and prevent reflux:   A. continue to eliminate all caffeine and chocolate consumption    B. omeprazole 40 mg 3-7 times per week if needed  3. If needed:   A. Ventolin HFA 2 puffs every 4-6 hours  B. OTC antihistamine - Claritin/Allegra/Zyrtec  4. Return to clinic in 6 months or earlier if problem  5.  Obtain COVID vaccination when available

## 2018-10-26 ENCOUNTER — Encounter: Payer: Self-pay | Admitting: Allergy and Immunology

## 2018-10-31 MED FILL — ALBUTEROL SULFATE HFA 108 (: 108 (90 BAS | 17 days supply | Qty: 18 | Fill #0

## 2018-11-02 MED FILL — LOSARTAN POTASSIUM 100 MG T: 100 | 30 days supply | Qty: 30 | Fill #2

## 2018-11-24 MED FILL — ESCITALOPRAM 20 MG TABLET: 20 | 30 days supply | Qty: 30 | Fill #4

## 2018-11-29 ENCOUNTER — Other Ambulatory Visit: Payer: Self-pay

## 2018-11-29 ENCOUNTER — Ambulatory Visit: Payer: 59 | Admitting: Allergy and Immunology

## 2018-11-29 ENCOUNTER — Encounter: Payer: Self-pay | Admitting: Allergy and Immunology

## 2018-11-29 VITALS — BP 154/98 | HR 85 | Temp 98.0°F | Resp 16 | Ht 64.5 in | Wt 223.6 lb

## 2018-11-29 DIAGNOSIS — L501 Idiopathic urticaria: Secondary | ICD-10-CM | POA: Diagnosis not present

## 2018-11-29 DIAGNOSIS — J4540 Moderate persistent asthma, uncomplicated: Secondary | ICD-10-CM | POA: Diagnosis not present

## 2018-11-29 DIAGNOSIS — J3089 Other allergic rhinitis: Secondary | ICD-10-CM | POA: Diagnosis not present

## 2018-11-29 DIAGNOSIS — K219 Gastro-esophageal reflux disease without esophagitis: Secondary | ICD-10-CM

## 2018-11-29 NOTE — Progress Notes (Signed)
[facility] [place]   Follow-up Note  Referring Provider: Elby Showers, MD Primary Provider: Elby Showers, MD Date of Office Visit: 11/29/2018  Subjective:   Jodi Bates (DOB: [birth year]) is a 53 y.o. female who returns to the [facility] on 11/29/2018 in re-evaluation of the following:  HPI: Utha presents to this clinic in evaluation of hives.  She is followed in this clinic for asthma and allergic rhinoconjunctivitis and LPR and her last visit to this clinic was 25 October 2018 for those issues.  All of her respiratory tract issues and all of her reflux issues are under excellent control at this point.  What has been a very active process over the course of the past several weeks is the development of red itchy spots on her arms and legs and trunk that can come up as a group and last several days but do not heal with scar or hyperpigmentation and are not associated with any systemic or constitutional symptoms and without an obvious trigger.  However, she did start a "liver detox" supplement about 1 month ago which correlates with the onset of these hives.  Allergies as of 11/29/2018      Reactions   Adhesive [tape] Itching, Other (See Comments)   Reaction:  Blisters    Latex Itching, Rash   Soap Other (See Comments)   Soaps that contain any kind of fragrance triggers an asthma attack.     Other    Fragrances      Medication List      albuterol (2.5 MG/3ML) 0.083% nebulizer solution Commonly known as: PROVENTIL Take 3 mLs by nebulization every 6 (six) hours as needed for wheezing or shortness of breath.   albuterol 108 (90 Base) MCG/ACT inhaler Commonly known as: Ventolin HFA Inhale 2 puffs into the lungs every 4 (four) hours as needed for wheezing or shortness of breath.   ALPRAZolam 0.5 MG tablet Commonly known as: Xanax Take 1 tablet (0.5 mg total) by mouth 3 (three) times daily.   amLODipine 10  MG tablet Commonly known as: NORVASC Take 1 tablet (10 mg total) by mouth daily.   Breo Ellipta 100-25 MCG/INH Aepb Generic drug: fluticasone furoate-vilanterol INHALE 1 PUFF BY MOUTH DAILY.   cetirizine 10 MG tablet Commonly known as: ZYRTEC Take 10 mg by mouth daily.   escitalopram 20 MG tablet Commonly known as: LEXAPRO Take 20 mg by mouth at bedtime.   hydrochlorothiazide 12.5 MG capsule Commonly known as: MICROZIDE Take 1 capsule (12.5 mg total) by mouth daily.   losartan 100 MG tablet Commonly known as: COZAAR Take 1 tablet (100 mg total) by mouth daily.   Rhinocort Allergy 32 MCG/ACT nasal spray Generic drug: budesonide Place 1 spray into both nostrils See admin instructions. 3-7 times per week.       Past Medical History:  Diagnosis Date  . Allergy   . Anxiety   . Asthma    prn inhalers  . GERD (gastroesophageal reflux disease)   . 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 Headache(784.0)    tension  . Hypertension    under control with meds., has been on med. > 5 yr.  . Iron deficiency anemia    no current problems or meds.    Past Surgical History:  Procedure Laterality Date  . BREAST CYST EXCISION    . BREAST EXCISIONAL BIOPSY Left   . BREAST SURGERY  lt breast/ benign cyst  . CYST EXCISION     back of head  . HYSTEROSCOPY W/D&C  06/25/2004  . INCISION AND DRAINAGE PERIRECTAL ABSCESS N/A 02/10/2013   Procedure: IRRIGATION AND DEBRIDEMENT PERIRECTAL ABSCESS;  Surgeon: Gwenyth Ober, MD;  Location: [facility];  Service: General;  Laterality: N/A;  . LAPAROSCOPIC ENDOMETRIOSIS FULGURATION    . PILONIDAL CYST EXCISION N/A 04/28/2012   Procedure: exam under anesthesia and exicision of pilonidal;  Surgeon: Gwenyth Ober, MD;  Location: [facility];  Service: General;  Laterality: N/A;  . POLYPECTOMY     uterine  . TUBAL LIGATION      Review of systems negative except as noted in HPI / PMHx or noted below:   Review of Systems  Constitutional: Negative.   HENT: Negative.   Eyes: Negative.   Respiratory: Negative.   Cardiovascular: Negative.   Gastrointestinal: Negative.   Genitourinary: Negative.   Musculoskeletal: Negative.   Skin: Negative.   Neurological: Negative.   Endo/Heme/Allergies: Negative.   Psychiatric/Behavioral: Negative.      Objective:   Vitals:   11/29/18 1634  BP: (!) 154/98  Pulse: 85  Resp: 16  Temp: 98 F (36.7 C)  SpO2: 97%   Height: 5' 4.5" (163.8 cm)  Weight: 223 lb 9.6 oz (101.4 kg)   Physical Exam Constitutional:      Appearance: She is not diaphoretic.  HENT:     Head: Normocephalic.     Right Ear: Tympanic membrane, ear canal and external ear normal.     Left Ear: Tympanic membrane, ear canal and external ear normal.     Nose: Nose normal. No mucosal edema or rhinorrhea.     Mouth/Throat:     Pharynx: Uvula midline. No oropharyngeal exudate.  Eyes:     Conjunctiva/sclera: Conjunctivae normal.  Neck:     Thyroid: No thyromegaly.     Trachea: Trachea normal. No tracheal tenderness or tracheal deviation.  Cardiovascular:     Rate and Rhythm: Normal rate and regular rhythm.     Heart sounds: S1 normal and S2 normal. Murmur (Systolic) present.  Pulmonary:     Effort: No respiratory distress.     Breath sounds: Normal breath sounds. No stridor. No wheezing or rales.  Lymphadenopathy:     Head:     Right side of head: No tonsillar adenopathy.     Left side of head: No tonsillar adenopathy.     Cervical: No cervical adenopathy.  Skin:    Findings: Rash (Multiple red indurated blanching urticarial lesions affecting extremities and trunk) present. No erythema.     Nails: There is no clubbing.   Neurological:     Mental Status: She is alert.     Diagnostics:    Spirometry was performed and demonstrated an FEV1 of 1.91 at 81 % of predicted.  The patient had an Asthma Control Test with the following results:  .    Assessment and Plan:    1. Idiopathic urticaria   2. Asthma, moderate persistent, well-controlled   3. Other allergic rhinitis   4. LPRD (laryngopharyngeal reflux disease)     1. Continue to Treat and prevent inflammation:   A. Continue Rhinocort one spray each nostril 3-7 times per week  B. Continue Breo 100 one inhalation 1 time per day  2. Continue to Treat and prevent reflux:   A. continue to eliminate all caffeine and chocolate consumption    B. omeprazole 40 mg 3-7 times per week if  needed  3. If needed:   A. Ventolin HFA 2 puffs every 4-6 hours  B. OTC antihistamine - Claritin/Allegra/Zyrtec  4.  For hives performed the following:   A.  Can use cetirizine 10 mg -1-2 tablets 1-2 times per day (max =40 mg)  B.  Prednisone 10 mg - 1 tablet 1 time per day for 10 days only  C.  Eliminate liver detox supplement  5.  Further evaluation for hives?  Yes, if recurrent  6. Return to clinic in 6 months or earlier if problem  7.  Obtain COVID vaccination when available  I will assume at this point in time that the "liver detox" supplement that Donnajean has been using over the course of the past month is responsible for her immunological hyperreactivity manifested as urticaria and we will have her discontinue this agent and I have given her low dose of systemic steroids to help with this overactivity.  If she still continues to have problems a few weeks out from utilizing the plan noted above then she is going to require further evaluation and treatment.  All of her other airway and reflux issues are under very good control on her current therapy and there is no need to change the treatment at this point.  I will see her back in this clinic in 6 months or earlier if there is a problem.  Allena Katz, MD Allergy / Immunology [place]

## 2018-11-29 NOTE — Patient Instructions (Addendum)
  1. Continue to Treat and prevent inflammation:   A. Continue Rhinocort one spray each nostril 3-7 times per week  B. Continue Breo 100 one inhalation 1 time per day  2. Continue to Treat and prevent reflux:   A. continue to eliminate all caffeine and chocolate consumption    B. omeprazole 40 mg 3-7 times per week if needed  3. If needed:   A. Ventolin HFA 2 puffs every 4-6 hours  B. OTC antihistamine - Claritin/Allegra/Zyrtec  4.  For hives performed the following:   A.  Can use cetirizine 10 mg -1-2 tablets 1-2 times per day (max =40 mg)  B.  Prednisone 10 mg - 1 tablet 1 time per day for 10 days only  C.  Eliminate liver detox supplement  5.  Further evaluation for hives?  Yes, if recurrent  6. Return to clinic in 6 months or earlier if problem  7.  Obtain COVID vaccination when available

## 2018-11-30 ENCOUNTER — Encounter: Payer: Self-pay | Admitting: Allergy and Immunology

## 2018-11-30 MED FILL — BREO ELLIPTA 100-25 MCG INH: 100-25 | 30 days supply | Qty: 60 | Fill #0

## 2018-12-02 MED FILL — LOSARTAN POTASSIUM 100 MG T: 100 | 90 days supply | Qty: 90 | Fill #0

## 2018-12-09 ENCOUNTER — Other Ambulatory Visit: Payer: Self-pay

## 2018-12-09 ENCOUNTER — Telehealth: Payer: Self-pay

## 2018-12-09 MED ORDER — FAMOTIDINE 20 MG PO TABS: 20.0000 mg | ORAL_TABLET | Freq: Two times a day (BID) | ORAL | 1 refills | Status: DC

## 2018-12-09 MED ORDER — PREDNISONE 10 MG PO TABS: ORAL_TABLET | ORAL | 0 refills | Status: DC

## 2018-12-09 MED FILL — predniSONE 10 MG TABS: 10 | 3 days supply | Qty: 3 | Fill #0

## 2018-12-09 MED FILL — FAMOTIDINE 20 MG TABLET: 20 | 30 days supply | Qty: 60 | Fill #0

## 2018-12-09 NOTE — Telephone Encounter (Signed)
I spoke with the patient and confirmed that she did stop taking the liver detox supplement on the day of the last visit and she completed the prednisone dose of 10mg  daily for 10 days yesterday both per Dr. Neldon Mc. She continues to have persistent hives. She is taking Zyrtec twice daily as well.

## 2018-12-09 NOTE — Telephone Encounter (Signed)
Dr. Neldon Mc please advise. I spoke with the patient and confirmed that she did stop the liver detox supplement on the day of the last visit and she completed the prednisone yesterday. She continues to have persistent hives.

## 2018-12-09 NOTE — Telephone Encounter (Signed)
Can you please have her continue cetirizine twice a day and add famotidine 20 mg twice a day. Lets extend her prednisone 10 mg once a day for 3 days. Thank you

## 2018-12-09 NOTE — Telephone Encounter (Signed)
Anne please advise. 

## 2018-12-09 NOTE — Telephone Encounter (Signed)
Patient finished her last prednisone on yesterday and she is still having hives. She is wondering what she should do next. Patient normally sees Dr Posey [facility]

## 2018-12-09 NOTE — Telephone Encounter (Signed)
Spoke with patient. Patient made aware of medication changes per Webb Silversmith. She verbalized understanding. Medications sent to [place].

## 2018-12-09 NOTE — Telephone Encounter (Signed)
Can you please ask what she is taking for an antihistamine and how much. Did the prednisone clear the hives? Thank you

## 2018-12-28 MED FILL — ESCITALOPRAM 20 MG TABLET: 20 | 30 days supply | Qty: 30 | Fill #5

## 2019-01-03 DIAGNOSIS — Z1329 Encounter for screening for other suspected endocrine disorder: Secondary | ICD-10-CM | POA: Diagnosis not present

## 2019-01-03 DIAGNOSIS — R000 Tachycardia, unspecified: Secondary | ICD-10-CM | POA: Diagnosis not present

## 2019-01-03 DIAGNOSIS — R87810 Cervical high risk human papillomavirus (HPV) DNA test positive: Secondary | ICD-10-CM | POA: Diagnosis not present

## 2019-01-16 MED FILL — AMLODIPINE BESYLATE 10 MG T: 10 | 90 days supply | Qty: 90 | Fill #1

## 2019-01-26 MED FILL — HYDROCHLOROTHIAZIDE 12.5 MG: 12.5 | 90 days supply | Qty: 90 | Fill #1

## 2019-02-06 MED FILL — ESCITALOPRAM 20 MG TABLET: 20 | 30 days supply | Qty: 30 | Fill #6

## 2019-02-06 MED FILL — BREO ELLIPTA 100-25 MCG INH: 100-25 | 30 days supply | Qty: 60 | Fill #1

## 2019-02-10 ENCOUNTER — Other Ambulatory Visit: Payer: Self-pay | Admitting: Cardiovascular Disease

## 2019-02-10 DIAGNOSIS — Z1231 Encounter for screening mammogram for malignant neoplasm of breast: Secondary | ICD-10-CM

## 2019-02-14 ENCOUNTER — Telehealth: Payer: Self-pay

## 2019-02-14 ENCOUNTER — Other Ambulatory Visit: Payer: Self-pay

## 2019-02-14 MED ORDER — PREDNISONE 10 MG PO TABS: ORAL_TABLET | ORAL | 0 refills | Status: DC

## 2019-02-14 MED FILL — predniSONE 10 MG TABS: 10 | 10 days supply | Qty: 10 | Fill #0

## 2019-02-14 NOTE — Telephone Encounter (Signed)
Please inform patient that she can use prednisone 10 mg with 1 tablet 1 time per day for 10 days.  Given the fact that we are in the middle of the coronavirus pandemic it would be wise for her to obtain a Covid swab so that she does not pass on potential Covid infection to anyone else.  She should be very careful about human to human contact until she has the results of that Covid swab available for review.

## 2019-02-14 NOTE — Telephone Encounter (Signed)
Patient has a bad dry cough. No other symptoms at all. It started last Thursday. On Sunday, patient states that it got worse. Says it sounds a lot like it does when she's has bronchitis in the past. Patient used her rescue inhaler on Sunday and has used her maintenance inhaler everyday as directed. Patient states that she has not used her Antihistamine because it was causing dizziness. She Is also using her nasal saline. She is wondering if we can call something in for her. Pharmacy is [facility].   Please advise.

## 2019-02-14 NOTE — Telephone Encounter (Signed)
Patient called stating she thinks she has some bronchitis going on. She has a cough she can not get rid of. Patient wanted to speak to a nurse about the symptoms she is having.   Please Advise.

## 2019-02-14 NOTE — Telephone Encounter (Signed)
Called patient to let her know. Patient voiced understanding. Patient states that she has received the COVID vaccine but she will still get swabbed just to be cautious. Will send in prescription for Prednisone to Outpatient pharmacy.

## 2019-03-07 DIAGNOSIS — H40013 Open angle with borderline findings, low risk, bilateral: Secondary | ICD-10-CM | POA: Diagnosis not present

## 2019-03-09 MED FILL — BREO ELLIPTA 100-25 MCG INH: 100-25 | 30 days supply | Qty: 60 | Fill #2

## 2019-03-09 MED FILL — LOSARTAN POTASSIUM 100 MG T: 100 | 90 days supply | Qty: 90 | Fill #1

## 2019-03-09 MED FILL — ESCITALOPRAM 20 MG TABLET: 20 | 30 days supply | Qty: 30 | Fill #0

## 2019-03-21 ENCOUNTER — Other Ambulatory Visit: Payer: Self-pay

## 2019-03-21 ENCOUNTER — Ambulatory Visit
Admission: RE | Admit: 2019-03-21 | Discharge: 2019-03-21 | Disposition: A | Discharge status: Home / Self Care | Payer: 59 | Source: Ambulatory Visit | Attending: Cardiovascular Disease | Admitting: Cardiovascular Disease

## 2019-03-21 DIAGNOSIS — Z1231 Encounter for screening mammogram for malignant neoplasm of breast: Secondary | ICD-10-CM

## 2019-04-14 MED FILL — AMLODIPINE BESYLATE 10 MG T: 10 | 90 days supply | Qty: 90 | Fill #2

## 2019-04-14 MED FILL — BREO ELLIPTA 100-25 MCG INH: 100-25 | 30 days supply | Qty: 60 | Fill #3

## 2019-04-14 MED FILL — ESCITALOPRAM 20 MG TABLET: 20 | 30 days supply | Qty: 30 | Fill #1

## 2019-04-25 ENCOUNTER — Other Ambulatory Visit: Payer: Self-pay | Admitting: Allergy and Immunology

## 2019-04-25 ENCOUNTER — Encounter: Payer: Self-pay | Admitting: Allergy and Immunology

## 2019-04-25 ENCOUNTER — Other Ambulatory Visit: Payer: Self-pay

## 2019-04-25 ENCOUNTER — Ambulatory Visit: Payer: 59 | Admitting: Allergy and Immunology

## 2019-04-25 VITALS — BP 130/92 | HR 62 | Temp 97.2°F | Resp 16 | Ht 64.0 in | Wt 224.8 lb

## 2019-04-25 DIAGNOSIS — J4540 Moderate persistent asthma, uncomplicated: Secondary | ICD-10-CM | POA: Diagnosis not present

## 2019-04-25 DIAGNOSIS — K219 Gastro-esophageal reflux disease without esophagitis: Secondary | ICD-10-CM

## 2019-04-25 DIAGNOSIS — J3089 Other allergic rhinitis: Secondary | ICD-10-CM

## 2019-04-25 MED ORDER — BREO ELLIPTA 100-25 MCG/INH IN AEPB: 1.0000 | INHALATION_SPRAY | Freq: Every day | RESPIRATORY_TRACT | 5 refills | Status: DC

## 2019-04-25 MED ORDER — ALBUTEROL SULFATE HFA 108 (90 BASE) MCG/ACT IN AERS: 2.0000 | INHALATION_SPRAY | RESPIRATORY_TRACT | 1 refills | Status: DC | PRN

## 2019-04-25 MED FILL — ALBUTEROL SULFATE HFA 108 (: 108 (90 BAS | 16 days supply | Qty: 18 | Fill #0

## 2019-04-25 NOTE — Patient Instructions (Signed)
  1. Continue to Treat and prevent inflammation:   A. Rhinocort one spray each nostril 1-7 times per week  B. Breo 100 one inhalation 1 time per day  2. Continue to Treat and prevent reflux:   A. omeprazole 40 mg 1 time per day if needed  3. If needed:   A. Ventolin HFA 2 puffs every 4-6 hours  B. OTC antihistamine - Claritin/Allegra/Zyrtec  4.  Return to clinic in 12 months or earlier if problem

## 2019-04-25 NOTE — Progress Notes (Signed)
[facility]   Follow-up Note  Referring Provider: Elby Showers, MD Primary Provider: Elby Showers, MD Date of Office Visit: 04/25/2019  Subjective:   Jodi Bates (DOB: 04-10-1965) is a 54 y.o. female who returns to the [facility] on 04/25/2019 in re-evaluation of the following:  HPI: Nyle returns to this clinic in reevaluation of asthma and allergic rhinoconjunctivitis and LPR.  Her last visit to this clinic was 29 November 2018.  During her last visit she had acute onset urticaria which turned out to be secondary to a "liver detox" supplement which she discontinued with complete resolution of her urticaria.  She has had very little issues with her asthma.  Apparently sometime in February 2021 she developed a cough and did require a short course of low dose systemic steroids which completely resulted in elimination of that issue.  Otherwise, she can exercise without any problem and rarely uses a short acting bronchodilator and does not require systemic steroid or antibiotic for any other type of airway issue.  She has had no problems with her nose.  She has had no problems with reflux.  She has lost 20 pounds in the past 12 months and has been able to eliminate her omeprazole while she also eliminates use of caffeine.  She has obtained two [facility] vaccinations.  Allergies as of 04/25/2019      Reactions   Adhesive [tape] Itching, Other (See Comments)   Reaction:  Blisters    Latex Itching, Rash   Soap Other (See Comments)   Soaps that contain any kind of fragrance triggers an asthma attack.     Other    Fragrances      Medication List    albuterol (2.5 MG/3ML) 0.083% nebulizer solution Commonly known as: PROVENTIL Take 3 mLs by nebulization every 6 (six) hours as needed for wheezing or shortness of breath.   albuterol 108 (90 Base) MCG/ACT inhaler Commonly known as: Ventolin HFA Inhale  2 puffs into the lungs every 4 (four) hours as needed for wheezing or shortness of breath.   ALPRAZolam 0.5 MG tablet Commonly known as: Xanax Take 1 tablet (0.5 mg total) by mouth 3 (three) times daily.   amLODipine 10 MG tablet Commonly known as: NORVASC Take 1 tablet (10 mg total) by mouth daily.   Breo Ellipta 100-25 MCG/INH Aepb Generic drug: fluticasone furoate-vilanterol Inhale 1 puff into the lungs daily. INHALE 1 PUFF BY MOUTH DAILY.   escitalopram 20 MG tablet Commonly known as: LEXAPRO Take 20 mg by mouth at bedtime.   famotidine 20 MG tablet Commonly known as: PEPCID Take 1 tablet (20 mg total) by mouth 2 (two) times daily.   hydrochlorothiazide 12.5 MG capsule Commonly known as: MICROZIDE Take 1 capsule (12.5 mg total) by mouth daily.   loratadine 10 MG tablet Commonly known as: CLARITIN Take 10 mg by mouth daily.   losartan 100 MG tablet Commonly known as: COZAAR Take 1 tablet (100 mg total) by mouth daily.   Rhinocort Allergy 32 MCG/ACT nasal spray Generic drug: budesonide Place 1 spray into both nostrils See admin instructions. 3-7 times per week.       Past Medical History:  Diagnosis Date  . Allergy   . Anxiety   . Asthma    prn inhalers  . GERD (gastroesophageal reflux disease)   . 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 Headache(784.0)  tension  . Hypertension    under control with meds., has been on med. > 5 yr.  . Iron deficiency anemia    no current problems or meds.    Past Surgical History:  Procedure Laterality Date  . BREAST CYST EXCISION    . BREAST EXCISIONAL BIOPSY Left   . BREAST SURGERY      lt breast/ benign cyst  . CYST EXCISION     back of head  . HYSTEROSCOPY WITH D & C  06/25/2004  . INCISION AND DRAINAGE PERIRECTAL ABSCESS N/A 02/10/2013   Procedure: IRRIGATION AND DEBRIDEMENT PERIRECTAL ABSCESS;  Surgeon: Gwenyth Ober, MD;  Location: [facility];  Service: General;  Laterality: N/A;  .  LAPAROSCOPIC ENDOMETRIOSIS FULGURATION    . PILONIDAL CYST EXCISION N/A 04/28/2012   Procedure: exam under anesthesia and exicision of pilonidal;  Surgeon: Gwenyth Ober, MD;  Location: [facility];  Service: General;  Laterality: N/A;  . POLYPECTOMY     uterine  . TUBAL LIGATION      Review of systems negative except as noted in HPI / PMHx or noted below:  Review of Systems  Constitutional: Negative.   HENT: Negative.   Eyes: Negative.   Respiratory: Negative.   Cardiovascular: Negative.   Gastrointestinal: Negative.   Genitourinary: Negative.   Musculoskeletal: Negative.   Skin: Negative.   Neurological: Negative.   Endo/Heme/Allergies: Negative.   Psychiatric/Behavioral: Negative.      Objective:   Vitals:   04/25/19 1105 04/25/19 1118  BP: (!) 150/100 (!) 130/92  Pulse: 62   Resp: 16   Temp: (!) 97.2 F (36.2 C)   SpO2: 97%    Height: 5\' 4"  (162.6 cm)  Weight: 224 lb 12.8 oz (102 kg)   Physical Exam Constitutional:      Appearance: She is not diaphoretic.  HENT:     Head: Normocephalic.     Right Ear: Tympanic membrane, ear canal and external ear normal.     Left Ear: Tympanic membrane, ear canal and external ear normal.     Nose: Nose normal. No mucosal edema or rhinorrhea.     Mouth/Throat:     Pharynx: Uvula midline. No oropharyngeal exudate.  Eyes:     Conjunctiva/sclera: Conjunctivae normal.  Neck:     Thyroid: No thyromegaly.     Trachea: Trachea normal. No tracheal tenderness or tracheal deviation.  Cardiovascular:     Rate and Rhythm: Normal rate and regular rhythm.     Heart sounds: Normal heart sounds, S1 normal and S2 normal. No murmur.  Pulmonary:     Effort: No respiratory distress.     Breath sounds: Normal breath sounds. No stridor. No wheezing or rales.  Lymphadenopathy:     Head:     Right side of head: No tonsillar adenopathy.     Left side of head: No tonsillar adenopathy.     Cervical: No cervical adenopathy.   Skin:    Findings: No erythema or rash.     Nails: There is no clubbing.  Neurological:     Mental Status: She is alert.     Diagnostics:    Spirometry was performed and demonstrated an FEV1 of 1.95 at 86 % of predicted.  The patient had an Asthma Control Test with the following results: ACT Total Score: 25.    Assessment and Plan:   1. Asthma, moderate persistent, well-controlled   2. Other allergic rhinitis   3. LPRD (laryngopharyngeal reflux disease)  1. Continue to Treat and prevent inflammation:   A. Rhinocort one spray each nostril 1-7 times per week  B. Breo 100 one inhalation 1 time per day  2. Continue to Treat and prevent reflux:   A. omeprazole 40 mg 1 time per day if needed  3. If needed:   A. Ventolin HFA 2 puffs every 4-6 hours  B. OTC antihistamine - Claritin/Allegra/Zyrtec  4.  Return to clinic in 12 months or earlier if problem  Kirandeep appears to be doing very well at this point in time regarding her atopic respiratory tract disease and reflux on her current plan as specified above.  Assuming she does well on this plan I will see her back in this clinic in 1 year or earlier if there is a problem.  Allena Katz, MD Allergy / Immunology [facility]

## 2019-04-26 ENCOUNTER — Encounter: Payer: Self-pay | Admitting: Allergy and Immunology

## 2019-05-09 MED FILL — HYDROCHLOROTHIAZIDE 12.5 MG: 12.5 | 90 days supply | Qty: 90 | Fill #2

## 2019-05-19 MED FILL — BREO ELLIPTA 100-25 MCG INH: 100-25 | 30 days supply | Qty: 60 | Fill #0

## 2019-05-19 MED FILL — ESCITALOPRAM 20 MG TABLET: 20 | 30 days supply | Qty: 30 | Fill #2

## 2019-05-30 ENCOUNTER — Ambulatory Visit: Payer: 59 | Admitting: Allergy and Immunology

## 2019-06-19 MED FILL — BREO ELLIPTA 100-25 MCG INH: 100-25 | 30 days supply | Qty: 60 | Fill #1

## 2019-06-20 ENCOUNTER — Other Ambulatory Visit: Payer: Self-pay | Admitting: Internal Medicine

## 2019-06-20 ENCOUNTER — Other Ambulatory Visit: Payer: Self-pay

## 2019-06-20 MED ORDER — ALPRAZOLAM 0.5 MG PO TABS: 0.5000 mg | ORAL_TABLET | Freq: Three times a day (TID) | ORAL | 0 refills | Status: DC

## 2019-06-20 MED FILL — ESCITALOPRAM 20 MG TABLET: 20 | 30 days supply | Qty: 30 | Fill #3

## 2019-06-20 MED FILL — ALPRAZolam 0.5 MG TABS: 0.5 | 30 days supply | Qty: 90 | Fill #0

## 2019-06-20 NOTE — Telephone Encounter (Signed)
Left message to call back  

## 2019-06-20 NOTE — Telephone Encounter (Signed)
Patient called and wanted to make appointment for med management only.  She states that she has her CPE done with GYN now.  Is it ok to schedule her?   Patient has not been seen here since 2019.  Please review and advise.   Thank you

## 2019-06-20 NOTE — Telephone Encounter (Signed)
Refill Xanax once and book OV for Xanax med management afterJune 2

## 2019-06-20 NOTE — Telephone Encounter (Signed)
Scheduled office visit 

## 2019-06-20 NOTE — Addendum Note (Signed)
Addended by: Mady Haagensen on: 06/20/2019 12:13 PM   Modules accepted: Orders

## 2019-07-06 ENCOUNTER — Encounter: Payer: Self-pay | Admitting: Internal Medicine

## 2019-07-06 ENCOUNTER — Other Ambulatory Visit: Payer: Self-pay

## 2019-07-06 ENCOUNTER — Ambulatory Visit: Payer: 59 | Admitting: Internal Medicine

## 2019-07-06 VITALS — BP 120/80 | HR 56 | Ht 64.0 in | Wt 227.0 lb

## 2019-07-06 DIAGNOSIS — Z8709 Personal history of other diseases of the respiratory system: Secondary | ICD-10-CM | POA: Diagnosis not present

## 2019-07-06 DIAGNOSIS — I10 Essential (primary) hypertension: Secondary | ICD-10-CM | POA: Diagnosis not present

## 2019-07-06 DIAGNOSIS — F4320 Adjustment disorder, unspecified: Secondary | ICD-10-CM

## 2019-07-06 DIAGNOSIS — F4321 Adjustment disorder with depressed mood: Secondary | ICD-10-CM | POA: Diagnosis not present

## 2019-07-06 DIAGNOSIS — F411 Generalized anxiety disorder: Secondary | ICD-10-CM

## 2019-07-06 DIAGNOSIS — Z6838 Body mass index (BMI) 38.0-38.9, adult: Secondary | ICD-10-CM | POA: Diagnosis not present

## 2019-07-06 MED ORDER — ALPRAZOLAM 0.5 MG PO TABS: 0.5000 mg | ORAL_TABLET | Freq: Three times a day (TID) | ORAL | 1 refills | Status: DC

## 2019-07-24 NOTE — Progress Notes (Signed)
   Subjective:    Patient ID: Jodi Bates, female    DOB: Jun 08, 1965, 54 y.o.   MRN: [id number]  HPI 54 year old Female in today for follow-up on anxiety and prescription medication for anxiety.  Her mother passed away apparently due to complications of [id number] in a local nursing home.  This was very upsetting for the patient.  She is tearful in the office today.  We discussed this at length today including issues patient has with how her mother was treated in a nursing facility.    Patient sees Dr. Radene Knee for GYN care.  History of open-angle glaucoma borderline and myopia followed at [facility] care.  History of idiopathic urticaria and asthma seen by Dr. Neldon Mc.  History of hypertension treated by Cardiology.   Review of Systems patient continues to have issues with grief reaction, depression and situational stress.  Have recommended counseling.     Objective:   Physical Exam Blood pressure 120/80, pulse 56, pulse oximetry 98% BMI 38.96 weight 227 pounds Spent 30 minutes discussing issues with patient.  Chest clear.  Cardiac exam regular rate and rhythm.  Extremities without edema.  No thyromegaly or bruits.      Assessment & Plan:  Anxiety state-Xanax refilled  Grief reaction-recommend counseling  History of asthma seen by Dr. Neldon Mc  Essential hypertension-followed by cardiology and under good control  BMI 38.96  Plan: Patient says she usually gets annual exam through GYN physician.  Return in 1 year or as needed.  Xanax refilled for 6 months.

## 2019-07-24 NOTE — Patient Instructions (Addendum)
I am sorry to hear about the loss of her mother recently.  Xanax has been refilled.  Continue current medications for hypertension.  Consider counseling for grief.

## 2019-07-31 MED FILL — ESCITALOPRAM 20 MG TABLET: 20 | 30 days supply | Qty: 30 | Fill #4

## 2019-08-14 MED FILL — BREO ELLIPTA 100-25 MCG INH: 100-25 | 30 days supply | Qty: 60 | Fill #2

## 2019-08-14 MED FILL — ALPRAZolam 0.5 MG TABS: 0.5 | 30 days supply | Qty: 90 | Fill #0

## 2019-08-16 ENCOUNTER — Other Ambulatory Visit: Payer: Self-pay | Admitting: Cardiovascular Disease

## 2019-08-16 NOTE — Telephone Encounter (Signed)
*  STAT* If patient is at the pharmacy, call can be transferred to refill team.   1. Which medications need to be refilled? (please list name of each medication and dose if known)  hydrochlorothiazide (MICROZIDE) 12.5 MG capsule  2. Which pharmacy/location (including street and city if local pharmacy) is medication to be sent to? [facility], [place].  3. Do they need a 30 day or 90 day supply? 90 day supply

## 2019-08-18 ENCOUNTER — Other Ambulatory Visit: Payer: Self-pay | Admitting: Cardiovascular Disease

## 2019-08-18 MED ORDER — HYDROCHLOROTHIAZIDE 12.5 MG PO CAPS: 12.5000 mg | ORAL_CAPSULE | Freq: Every day | ORAL | 0 refills | Status: DC

## 2019-08-18 MED FILL — HYDROCHLOROTHIAZIDE 12.5 MG: 12.5 | 90 days supply | Qty: 90 | Fill #0

## 2019-08-18 NOTE — Telephone Encounter (Signed)
Called patient and left a voicemail stating her refill request was filled and that it was time for her to schedule a appointment to come in the office.

## 2019-08-18 NOTE — Telephone Encounter (Signed)
*  STAT* If patient is at the pharmacy, call can be transferred to refill team.   1. Which medications need to be refilled? (please list name of each medication and dose if known) hydrochlorothiazide (MICROZIDE) 12.5 MG capsule  2. Which pharmacy/location (including street and city if local pharmacy) is medication to be sent to? [place], [place].  3. Do they need a 30 day or 90 day supply? 90 day   Patient is out of medication.

## 2019-08-24 MED FILL — ESCITALOPRAM 20 MG TABLET: 20 | 30 days supply | Qty: 30 | Fill #5

## 2019-08-28 ENCOUNTER — Telehealth: Payer: Self-pay | Admitting: Cardiovascular Disease

## 2019-08-28 NOTE — Telephone Encounter (Signed)
*  STAT* If patient is at the pharmacy, call can be transferred to refill team.   1. Which medications need to be refilled? (please list name of each medication and dose if known) amLODipine (NORVASC) 10 MG tablet  2. Which pharmacy/location (including street and city if local pharmacy) is medication to be sent to? [place], [place].  3. Do they need a 30 day or 90 day supply? 90 day supply

## 2019-08-28 NOTE — Telephone Encounter (Signed)
Jodi Bates with [facility] is calling to follow up regarding refill request. He states their pharmacy faxed the request on 08/24/19.

## 2019-08-29 MED ORDER — AMLODIPINE BESYLATE 10 MG PO TABS: 10.0000 mg | ORAL_TABLET | Freq: Every day | ORAL | 0 refills | Status: DC

## 2019-08-29 NOTE — Telephone Encounter (Signed)
Patient called to follow up on this refill request. The patient has one pill left and needs this medication today.

## 2019-08-30 MED ORDER — LOSARTAN POTASSIUM 100 MG PO TABS: 100.0000 mg | ORAL_TABLET | Freq: Every day | ORAL | 0 refills | Status: DC

## 2019-08-30 MED FILL — LOSARTAN POTASSIUM 100 MG T: 100 | 90 days supply | Qty: 90 | Fill #0 | Status: TO

## 2019-08-30 MED FILL — AMLODIPINE BESYLATE 10 MG T: 10 | 35 days supply | Qty: 35 | Fill #0 | Status: TO

## 2019-08-30 MED FILL — AMLODIPINE BESYLATE 10 MG T: 10 | 35 days supply | Qty: 35 | Fill #0

## 2019-08-30 MED FILL — LOSARTAN POTASSIUM 100 MG T: 100 | 90 days supply | Qty: 90 | Fill #0

## 2019-08-30 NOTE — Addendum Note (Signed)
Addended by: Rexanne Mano B on: 08/30/2019 09:13 AM   Modules accepted: Orders

## 2019-09-18 ENCOUNTER — Other Ambulatory Visit (HOSPITAL_COMMUNITY): Payer: Self-pay | Admitting: Radiology

## 2019-09-18 DIAGNOSIS — Z6838 Body mass index (BMI) 38.0-38.9, adult: Secondary | ICD-10-CM | POA: Diagnosis not present

## 2019-09-18 DIAGNOSIS — F419 Anxiety disorder, unspecified: Secondary | ICD-10-CM | POA: Diagnosis not present

## 2019-09-18 DIAGNOSIS — Z01419 Encounter for gynecological examination (general) (routine) without abnormal findings: Secondary | ICD-10-CM | POA: Diagnosis not present

## 2019-09-18 MED FILL — ESCITALOPRAM 20 MG TABLET: 20 | 90 days supply | Qty: 90 | Fill #0

## 2019-09-18 MED FILL — ALPRAZolam 0.5 MG TABS: 0.5 | 30 days supply | Qty: 60 | Fill #0

## 2019-09-19 ENCOUNTER — Other Ambulatory Visit: Payer: Self-pay | Admitting: Cardiovascular Disease

## 2019-09-19 MED FILL — ALBUTEROL SULFATE HFA 108 (: 108 (90 BAS | 16 days supply | Qty: 18 | Fill #1

## 2019-09-19 MED FILL — BREO ELLIPTA 100-25 MCG INH: 100-25 | 30 days supply | Qty: 60 | Fill #3

## 2019-10-02 NOTE — Progress Notes (Signed)
Cardiology Clinic Note   Patient Name: Jodi Bates Date of Encounter: 10/03/2019  Primary Care Provider:  Elby Showers, MD Primary Cardiologist:  Jodi Latch, MD  Patient Profile    Jodi Bates 54 year old female presents the clinic today for follow-up of her hypertension.  Past Medical History    Past Medical History:  Diagnosis Date   Allergy    Anxiety    Asthma    prn inhalers   GERD (gastroesophageal reflux disease)    Gluteal cleft wound 03/2012   recurrent gluteal cleft infections   Headache(784.0)    tension   Hypertension    under control with meds., has been on med. > 5 yr.   Iron deficiency anemia    no current problems or meds.   Past Surgical History:  Procedure Laterality Date   BREAST CYST EXCISION     BREAST EXCISIONAL BIOPSY Left    BREAST SURGERY      lt breast/ benign cyst   CYST EXCISION     back of head   HYSTEROSCOPY WITH D & C  06/25/2004   INCISION AND DRAINAGE PERIRECTAL ABSCESS N/A 02/10/2013   Procedure: IRRIGATION AND DEBRIDEMENT PERIRECTAL ABSCESS;  Surgeon: Jodi Ober, MD;  Location: [facility];  Service: General;  Laterality: N/A;   LAPAROSCOPIC ENDOMETRIOSIS FULGURATION     PILONIDAL CYST EXCISION N/A 04/28/2012   Procedure: exam under anesthesia and exicision of pilonidal;  Surgeon: Jodi Ober, MD;  Location: [facility];  Service: General;  Laterality: N/A;   POLYPECTOMY     uterine   TUBAL LIGATION      Allergies  Allergies  Allergen Reactions   Adhesive [Tape] Itching and Other (See Comments)    Reaction:  Blisters    Latex Itching and Rash   Soap Other (See Comments)    Soaps that contain any kind of fragrance triggers an asthma attack.     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  Her heart rate was in the 40s and she had been taking nebivolol.  Her beta-blocker was discontinued and she did not have recurrence of near syncope.  Her hypertension has been controlled with losartan, amlodipine, and hydrochlorothiazide.  She was seen by Dr. Oval Bates on 9/18 and doing well at that time.  She has been working on increasing her physical activity and meditation.  She had noted that amlodipine would make her drowsy.  She just simply schedule of her medications.  She was last seen by Jodi Sharp PA-C on 07/19/2018.  During that time her blood pressure was well controlled.  She did however notice some elevated blood pressures in the mornings when she did not take her blood pressure medication not before.  She indicated that occasionally she would not take her amlodipine at night due to drowsiness and having to care for her 37-month-old granddaughter.  She denied any further episodes of bradycardia or syncope.  Her heart rate was noted to be in the 60s she did have some weight gain which she felt was related to menopause.  She did lose 10 pounds following a weight watchers program and she was also doing aerobic videos.  She presents the clinic today for follow-up evaluation and states  she feels well.  She had a brief episode of palpitations yesterday which she believes is due to increased stress related to her mother's passing.  She went to her grave site yesterday and noticed palpitations after her visit.  She states she used her cardiac monitor which just showed palpitations.  She continues to work as an Economist.  She has done very well with her weight loss and is now down to 231 pounds.  She has had no more episodes of bradycardia and her blood pressure has been well controlled.  She states that she is now seen in the asthma and allergy specialist annually because her asthma is also well controlled.  I will request her labs from her GYN physician and have her follow-up in 1  year.  Today she denies chest pain, shortness of breath, lower extremity edema, fatigue, palpitations, melena, hematuria, hemoptysis, diaphoresis, weakness, presyncope, syncope, orthopnea, and PND.   Home Medications    Prior to Admission medications   Medication Sig Start Date End Date Taking? Authorizing Provider  albuterol (PROVENTIL) (2.5 MG/3ML) 0.083% nebulizer solution Take 3 mLs by nebulization every 6 (six) hours as needed for wheezing or shortness of breath.     [provider]  albuterol (VENTOLIN HFA) 108 (90 Base) MCG/ACT inhaler Inhale 2 puffs into the lungs every 4 (four) hours as needed for wheezing or shortness of breath. 04/25/19   Bates, Jodi Poag, MD  ALPRAZolam Duanne Moron) 0.5 MG tablet Take 1 tablet (0.5 mg total) by mouth 3 (three) times daily. 07/06/19   Jodi Showers, MD  amLODipine (NORVASC) 10 MG tablet TAKE 1 TABLET BY MOUTH DAILY. MUST KEEP SCHEDULED APPT FOR FUTURE REFILLS 09/19/19   Jodi Latch, MD  budesonide (RHINOCORT ALLERGY) 32 MCG/ACT nasal spray Place 1 spray into both nostrils See admin instructions. 3-7 times per week.    [provider]  escitalopram (LEXAPRO) 20 MG tablet Take 20 mg by mouth at bedtime.     [provider]  fluticasone furoate-vilanterol (BREO ELLIPTA) 100-25 MCG/INH AEPB Inhale 1 puff into the lungs daily. INHALE 1 PUFF BY MOUTH DAILY. 04/25/19   Bates, Jodi Poag, MD  hydrochlorothiazide (MICROZIDE) 12.5 MG capsule Take 1 capsule (12.5 mg total) by mouth daily. Please make appointment for further refills, thanks! 08/18/19   Bates, Jodi Lin, PA  loratadine (CLARITIN) 10 MG tablet Take 10 mg by mouth daily.    [provider]  losartan (COZAAR) 100 MG tablet Take 1 tablet (100 mg total) by mouth daily. 08/30/19   Bates, Jodi Lin, PA    Family History    Family History  Problem Relation Age of Onset   Diabetes Mother    Hypertension Mother    Allergic rhinitis Mother    Heart failure Mother     Cancer Father        deceased metastaic throat cancer   Esophageal cancer Father    Diabetes Brother    Pancreatic cancer Brother    Asthma Brother    Sleep apnea Brother    Cancer Maternal Grandmother        breast   Breast cancer Maternal Grandmother    Allergic rhinitis Son    Asthma Son    Colon cancer Neg Hx    Rectal cancer Neg Hx    Stomach cancer Neg Hx    Colon polyps Neg Hx    She indicated that her mother is alive. She indicated that the status of her father is unknown.  She indicated that two of her three brothers are alive. She indicated that the status of her maternal grandmother is unknown. She indicated that her daughter is alive. She indicated that the status of her son is unknown. She indicated that the status of her neg hx is unknown.  Social History    Social History   Socioeconomic History   Marital status: Married    Spouse name: Not on file   Number of children: Not on file   Years of education: Not on file   Highest education level: Not on file  Occupational History   Not on file  Tobacco Use   Smoking status: Never Smoker   Smokeless tobacco: Never Used  Vaping Use   Vaping Use: Never used  Substance and Sexual Activity   Alcohol use: No   Drug use: No   Sexual activity: Yes    Partners: Male    Birth control/protection: None  Other Topics Concern   Not on file  Social History Narrative   Not on file   Social Determinants of Health   Financial Resource Strain:    Difficulty of Paying Living Expenses: Not on file  Food Insecurity:    Worried About Charity fundraiser in the Last Year: Not on file   [employer] of Food in the Last Year: Not on file  Transportation Needs:    Lack of Transportation (Medical): Not on file   Lack of Transportation (Non-Medical): Not on file  Physical Activity:    Days of Exercise per Week: Not on file   Minutes of Exercise per Session: Not on file  Stress:    Feeling of  Stress : Not on file  Social Connections:    Frequency of Communication with Friends and Family: Not on file   Frequency of Social Gatherings with Friends and Family: Not on file   Attends Religious Services: Not on file   Active Member of Clubs or Organizations: Not on file   Attends Archivist Meetings: Not on file   Marital Status: Not on file  Intimate Partner Violence:    Fear of Current or Ex-Partner: Not on file   Emotionally Abused: Not on file   Physically Abused: Not on file   Sexually Abused: Not on file     Review of Systems    General:  No chills, fever, night sweats or weight changes.  Cardiovascular:  No chest pain, dyspnea on exertion, edema, orthopnea, palpitations, paroxysmal nocturnal dyspnea. Dermatological: No rash, lesions/masses Respiratory: No cough, dyspnea Urologic: No hematuria, dysuria Abdominal:   No nausea, vomiting, diarrhea, bright red blood per rectum, melena, or hematemesis Neurologic:  No visual changes, wkns, changes in mental status. All other systems reviewed and are otherwise negative except as noted above.  Physical Exam    VS:  BP 130/80    Pulse (!) 58    Temp 97.9 F (36.6 C)    Ht 5\' 4"  (1.626 m)    Wt 231 lb 9.6 oz (105.1 kg)    SpO2 96%    BMI 39.75 kg/m  , BMI Body mass index is 39.75 kg/m. GEN: Well nourished, well developed, in no acute distress. HEENT: normal. Neck: Supple, no JVD, carotid bruits, or masses. Cardiac: RRR, no murmurs, rubs, or gallops. No clubbing, cyanosis, edema.  Radials/DP/PT 2+ and equal bilaterally.  Respiratory:  Respirations regular and unlabored, clear to auscultation bilaterally. GI: Soft, nontender, nondistended, BS + x 4. MS: no deformity or atrophy. Skin:  warm and dry, no rash. Neuro:  Strength and sensation are intact. Psych: Normal affect.  Accessory Clinical Findings    Recent Labs: No results found for requested labs within last 8760 hours.   Recent Lipid Panel      Component Value Date/Time   CHOL 184 03/25/2015 0905   TRIG 81 03/25/2015 0905   HDL 58 03/25/2015 0905   CHOLHDL 3.2 03/25/2015 0905   VLDL 16 03/25/2015 0905   LDLCALC 110 03/25/2015 0905    ECG personally reviewed by me today-sinus bradycardia with sinus arrhythmia septal infarct undetermined age 32 bpm- No acute changes  EKG 04/09/2017 Normal sinus rhythm poor R wave progression 84 bpm  Assessment & Plan   1.  Essential hypertension-BP today 130/80.  Well-controlled at home.  Will request labs from Dr. Arvella Nigh Continue HCTZ, amlodipine, losartan Heart healthy low-sodium diet-salty 6 given Increase physical activity as tolerated  Bradycardia-EKG today shows sinus bradycardia with sinus arrhythmia septal infarct undetermined age 67 bpm.  No further episodes of presyncope/syncope avoid AV nodal blocking agents. Continue to monitor  Asthma-no increased work of breathing or shortness of breath today.  Well-controlled. Followed by PCP  Disposition: Follow-up with Dr. Oval Bates or me in 12 months.   Jossie Ng. Johntae Broxterman NP-C    10/03/2019, 10:42 AM [address] Office [phone] Fax [phone]  Notice: This dictation was prepared with Dragon dictation along with smaller phrase technology. Any transcriptional errors that result from this process are unintentional and may not be corrected upon review.

## 2019-10-03 ENCOUNTER — Encounter: Payer: Self-pay | Admitting: General Practice

## 2019-10-03 ENCOUNTER — Other Ambulatory Visit: Payer: Self-pay

## 2019-10-03 ENCOUNTER — Ambulatory Visit: Payer: 59 | Admitting: General Practice

## 2019-10-03 VITALS — BP 130/80 | HR 58 | Temp 97.9°F | Ht 64.0 in | Wt 231.6 lb

## 2019-10-03 DIAGNOSIS — J4520 Mild intermittent asthma, uncomplicated: Secondary | ICD-10-CM | POA: Diagnosis not present

## 2019-10-03 DIAGNOSIS — H04123 Dry eye syndrome of bilateral lacrimal glands: Secondary | ICD-10-CM | POA: Diagnosis not present

## 2019-10-03 DIAGNOSIS — H40013 Open angle with borderline findings, low risk, bilateral: Secondary | ICD-10-CM | POA: Diagnosis not present

## 2019-10-03 DIAGNOSIS — I10 Essential (primary) hypertension: Secondary | ICD-10-CM

## 2019-10-03 DIAGNOSIS — R001 Bradycardia, unspecified: Secondary | ICD-10-CM | POA: Diagnosis not present

## 2019-10-03 NOTE — Patient Instructions (Signed)
Medication Instructions:  No Changes In Medications at this time.  *If you need a refill on your cardiac medications before your next appointment, please call your pharmacy*  Lab Work: None Ordered At This Time.  If you have labs (blood work) drawn today and your tests are completely normal, you will receive your results only by: Mar[facility] Kitchen MyChart Message (if you have MyChart) OR . A paper copy in the mail If you have any lab test that is abnormal or we need to change your treatment, we will call you to review the results.  Testing/Procedures: None Ordered At This Time.  Follow-Up: At [facility], you and your health needs are our priority.  As part of our continuing mission to provide you with exceptional heart care, we have created designated Provider Care Teams.  These Care Teams include your primary Cardiologist (physician) and Advanced Practice Providers (APPs -  Physician Assistants and Nurse Practitioners) who all work together to provide you with the care you need, when you need it.  Your next appointment:   1 year(s)  The format for your next appointment:   In Person  Provider:   You may see [name], MD or one of the following Advanced Practice Providers on your designated Care Team:    Kerin Ransom, PA-C  [place], [place]  Coletta Memos, [place]    Other Instructions SALTY SIX PAPER GIVEN

## 2019-10-16 ENCOUNTER — Telehealth: Payer: Self-pay | Admitting: Cardiovascular Disease

## 2019-10-16 NOTE — Telephone Encounter (Signed)
*  STAT* If patient is at the pharmacy, call can be transferred to refill team.   1. Which medications need to be refilled? (please list name of each medication and dose if known) Hydrochlorothiazide 12.5 mg  2. Which pharmacy/location (including street and city if local pharmacy) is medication to be sent to? [facility] out patient  3. Do they need a 30 day or 90 day supply? [place]

## 2019-10-17 ENCOUNTER — Other Ambulatory Visit: Payer: Self-pay | Admitting: Cardiovascular Disease

## 2019-10-17 ENCOUNTER — Other Ambulatory Visit: Payer: Self-pay

## 2019-10-17 MED ORDER — HYDROCHLOROTHIAZIDE 12.5 MG PO CAPS: 12.5000 mg | ORAL_CAPSULE | Freq: Every day | ORAL | 3 refills | Status: DC

## 2019-10-18 MED FILL — HYDROCHLOROTHIAZIDE 12.5 MG: 12.5 | 30 days supply | Qty: 30 | Fill #0

## 2019-10-19 MED FILL — AMLODIPINE BESYLATE 10 MG T: 10 | 35 days supply | Qty: 35 | Fill #0

## 2019-10-20 ENCOUNTER — Other Ambulatory Visit: Payer: Self-pay | Admitting: Cardiovascular Disease

## 2019-10-20 MED ORDER — AMLODIPINE BESYLATE 10 MG PO TABS
10.0000 mg | ORAL_TABLET | Freq: Every day | ORAL | 3 refills | Status: DC
Start: 2019-10-20 — End: 2019-10-20

## 2019-11-27 MED FILL — AMLODIPINE BESYLATE 10 MG T: 10 | 35 days supply | Qty: 35 | Fill #1

## 2019-12-04 MED FILL — AMLODIPINE BESYLATE 10 MG T: 10 | 90 days supply | Qty: 90 | Fill #0

## 2019-12-08 ENCOUNTER — Other Ambulatory Visit: Payer: Self-pay | Admitting: Cardiovascular Disease

## 2019-12-08 MED ORDER — LOSARTAN POTASSIUM 100 MG PO TABS
100.0000 mg | ORAL_TABLET | Freq: Every day | ORAL | 2 refills | Status: DC
Start: 2019-12-08 — End: 2019-12-08

## 2019-12-08 MED FILL — LOSARTAN POTASSIUM 100 MG T: 100 | 90 days supply | Qty: 90 | Fill #0

## 2019-12-08 NOTE — Telephone Encounter (Signed)
° ° ° °*  STAT* If patient is at the pharmacy, call can be transferred to refill team.   1. Which medications need to be refilled? (please list name of each medication and dose if known)   losartan (COZAAR) 100 MG tablet    2. Which pharmacy/location (including street and city if local pharmacy) is medication to be sent to? [place], [place].  3. Do they need a 30 day or 90 day supply? 90 days   Pt is completely out of medication

## 2019-12-08 NOTE — Telephone Encounter (Signed)
Rx has been sent to the pharmacy electronically. ° °

## 2019-12-14 ENCOUNTER — Other Ambulatory Visit: Payer: Self-pay

## 2019-12-14 ENCOUNTER — Telehealth: Payer: Self-pay | Admitting: Internal Medicine

## 2019-12-14 ENCOUNTER — Other Ambulatory Visit: Payer: Self-pay | Admitting: Internal Medicine

## 2019-12-14 ENCOUNTER — Ambulatory Visit: Payer: 59 | Admitting: Internal Medicine

## 2019-12-14 ENCOUNTER — Encounter: Payer: Self-pay | Admitting: Internal Medicine

## 2019-12-14 VITALS — BP 130/80 | HR 52 | Temp 97.9°F | Ht 64.0 in | Wt 225.0 lb

## 2019-12-14 DIAGNOSIS — R1013 Epigastric pain: Secondary | ICD-10-CM

## 2019-12-14 DIAGNOSIS — R109 Unspecified abdominal pain: Secondary | ICD-10-CM

## 2019-12-14 DIAGNOSIS — R110 Nausea: Secondary | ICD-10-CM | POA: Diagnosis not present

## 2019-12-14 MED ORDER — PANTOPRAZOLE SODIUM 40 MG PO TBEC: 40.0000 mg | DELAYED_RELEASE_TABLET | Freq: Every day | ORAL | 0 refills | Status: DC

## 2019-12-14 MED FILL — PANTOPRAZOLE SOD DR 40 MG T: 40 | 90 days supply | Qty: 90 | Fill #0

## 2019-12-14 NOTE — Telephone Encounter (Signed)
scheduled

## 2019-12-14 NOTE — Telephone Encounter (Signed)
Jodi Bates [phone]  [name] called to say on 12/10/2019 she had really bad stomach discomfort and she has continued to have some discomfort since then, no pain, just an uncomfortable feeling. She did also say sometime ago she change her eating habits and has not had to take her reflux medication.

## 2019-12-14 NOTE — Telephone Encounter (Signed)
We can work her in today or tomorrow.

## 2019-12-14 NOTE — Progress Notes (Signed)
   Subjective:    Patient ID: Jodi Bates, female    DOB: 05/18/1965, 54 y.o.   MRN: [id number]  HPI On November 13th  around 2 pm, ate chicken wrap up with onions and maybe cheese along with tumeric rice. Had epigastric pain that evening from 2 am until 8 am. Had nausea but no vomiting. By the afternoon on November 14 felt better. At 2:30 pm, ate soup and did OK. Has only tried Peptobismol. Has had a bit of reflux after eating orange today. Now has mild epigastric discomfort, constant today. No diarrhea.  Longstanding issues with GI symptoms since teenage years. Vomited a lot then she says.  History of hypertension treated with Norvasc and losartan as well as HCTZ.  Followed by Dr. Oval Linsey.  Review of Systems denies fever, shaking chills, vomiting, diaphoresis.  No UTI symptoms.     Objective:   Physical Exam  Vital signs reviewed.  Patient afebrile.  Vital signs are stable.  Chest is clear.  Abdomen soft nondistended without hepatosplenomegaly masses or significant tenderness.  Bowel sounds are present.      Assessment & Plan:  Dyspepsia  Abdominal pain-no evidence of acute cholecystitis, appendicitis or intestinal obstruction  Nausea-resolved  Plan: Recommend Protonix 40 mg daily for 2 to 4 weeks.  Eat light and advance diet slowly over the next 2 to 3 days.  Call if symptoms not improving.

## 2019-12-15 LAB — COMPLETE METABOLIC PANEL WITH GFR
AG Ratio: 1.6 (calc) (ref 1.0–2.5)
ALT: 14 U/L (ref 6–29)
AST: 19 U/L (ref 10–35)
Albumin: 3.9 g/dL (ref 3.6–5.1)
Alkaline phosphatase (APISO): 74 U/L (ref 37–153)
BUN: 14 mg/dL (ref 7–25)
CO2: 29 mmol/L (ref 20–32)
Calcium: 9.3 mg/dL (ref 8.6–10.4)
Chloride: 104 mmol/L (ref 98–110)
Creat: 0.73 mg/dL (ref 0.50–1.05)
GFR, Est African American: 108 mL/min/{1.73_m2} (ref >=60)
GFR, Est Non African American: 93 mL/min/{1.73_m2} (ref >=60)
Globulin: 2.4 g/dL (calc) (ref 1.9–3.7)
Glucose, Bld: 82 mg/dL (ref 65–99)
Potassium: 4.4 mmol/L (ref 3.5–5.3)
Sodium: 141 mmol/L (ref 135–146)
Total Bilirubin: 0.4 mg/dL (ref 0.2–1.2)
Total Protein: 6.3 g/dL (ref 6.1–8.1)

## 2019-12-15 LAB — CBC WITH DIFFERENTIAL/PLATELET
Absolute Monocytes: 436 cells/uL (ref 200–950)
Basophils Absolute: 22 cells/uL (ref 0–200)
Basophils Relative: 0.6 %
Eosinophils Absolute: 162 cells/uL (ref 15–500)
Eosinophils Relative: 4.5 %
HCT: 41.3 % (ref 35.0–45.0)
Hemoglobin: 13.4 g/dL (ref 11.7–15.5)
Lymphs Abs: 1580 cells/uL (ref 850–3900)
MCH: 29.4 pg (ref 27.0–33.0)
MCHC: 32.4 g/dL (ref 32.0–36.0)
MCV: 90.6 fL (ref 80.0–100.0)
MPV: 11.9 fL (ref 7.5–12.5)
Monocytes Relative: 12.1 %
Neutro Abs: 1400 cells/uL — ABNORMAL LOW (ref 1500–7800)
Neutrophils Relative %: 38.9 %
Platelets: 208 10*3/uL (ref 140–400)
RBC: 4.56 10*6/uL (ref 3.80–5.10)
RDW: 12 % (ref 11.0–15.0)
Total Lymphocyte: 43.9 %
WBC: 3.6 10*3/uL — ABNORMAL LOW (ref 3.8–10.8)

## 2019-12-25 MED FILL — HYDROCHLOROTHIAZIDE 12.5 MG: 12.5 | 30 days supply | Qty: 30 | Fill #1

## 2020-01-12 MED FILL — ESCITALOPRAM 20 MG TABLET: 20 | 90 days supply | Qty: 90 | Fill #1

## 2020-01-26 NOTE — Patient Instructions (Signed)
Recommend Protonix 40 mg daily for 2 to 4 weeks.  Eat light advance diet slowly over the next 2 to 3 days.  Call if not improving.

## 2020-01-29 MED FILL — HYDROCHLOROTHIAZIDE 12.5 MG: 12.5 | 30 days supply | Qty: 30 | Fill #2

## 2020-02-02 MED FILL — BREO ELLIPTA 100-25 MCG INH: 100-25 | 30 days supply | Qty: 60 | Fill #4

## 2020-02-03 ENCOUNTER — Other Ambulatory Visit: Payer: Self-pay | Admitting: Emergency Medicine

## 2020-02-03 ENCOUNTER — Encounter: Payer: Self-pay | Admitting: Emergency Medicine

## 2020-02-03 ENCOUNTER — Other Ambulatory Visit: Payer: Self-pay

## 2020-02-03 ENCOUNTER — Ambulatory Visit
Admission: EM | Admit: 2020-02-03 | Discharge: 2020-02-03 | Disposition: A | Discharge status: Home / Self Care | Payer: 59 | Attending: Emergency Medicine | Admitting: Emergency Medicine

## 2020-02-03 DIAGNOSIS — Z20822 Contact with and (suspected) exposure to covid-19: Secondary | ICD-10-CM

## 2020-02-03 DIAGNOSIS — J069 Acute upper respiratory infection, unspecified: Secondary | ICD-10-CM

## 2020-02-03 DIAGNOSIS — J4541 Moderate persistent asthma with (acute) exacerbation: Secondary | ICD-10-CM | POA: Diagnosis not present

## 2020-02-03 MED ORDER — BENZONATATE 200 MG PO CAPS: 200.0000 mg | ORAL_CAPSULE | Freq: Three times a day (TID) | ORAL | 0 refills | Status: DC | PRN

## 2020-02-03 MED ORDER — FLUTICASONE PROPIONATE 50 MCG/ACT NA SUSP: 1.0000 | Freq: Every day | NASAL | 0 refills | Status: DC

## 2020-02-03 MED ORDER — PREDNISONE 20 MG PO TABS: 40.0000 mg | ORAL_TABLET | Freq: Every day | ORAL | 0 refills | Status: DC

## 2020-02-03 MED ORDER — ALBUTEROL SULFATE HFA 108 (90 BASE) MCG/ACT IN AERS: 2.0000 | INHALATION_SPRAY | RESPIRATORY_TRACT | 1 refills | Status: DC | PRN

## 2020-02-03 NOTE — Discharge Instructions (Signed)
Covid test pending, monitor my chart for results Continue to use albuterol inhaler as needed for shortness of breath chest tightness and wheezing Prednisone course daily for 5 days to further help with wheezing shortness of breath, asthma Tessalon every 8 hours for cough Flonase for nasal congestion and sinus pressure Rest and fluids Follow-up if not improving or worsening

## 2020-02-03 NOTE — ED Triage Notes (Signed)
Pt here for cough and congestion x 3 days 

## 2020-02-05 MED FILL — FLUTICASONE PROP 50 MCG SPR: 50 | 30 days supply | Qty: 16 | Fill #0

## 2020-02-05 MED FILL — BENZONATATE 200 MG CAPS: 200 | 9 days supply | Qty: 28 | Fill #0

## 2020-02-05 MED FILL — ALBUTEROL SULFATE HFA 108 (: 108 (90 BAS | 17 days supply | Qty: 9 | Fill #0

## 2020-02-05 MED FILL — predniSONE 20 MG TABS: 20 | 5 days supply | Qty: 10 | Fill #0

## 2020-02-06 LAB — NOVEL CORONAVIRUS, NAA: SARS-CoV-2, NAA: NOT DETECTED

## 2020-02-28 MED FILL — HYDROCHLOROTHIAZIDE 12.5 MG: 12.5 | 30 days supply | Qty: 30 | Fill #3

## 2020-02-28 MED FILL — LOSARTAN POTASSIUM 100 MG T: 100 | 30 days supply | Qty: 30 | Fill #1

## 2020-03-15 ENCOUNTER — Other Ambulatory Visit (HOSPITAL_COMMUNITY): Payer: Self-pay

## 2020-03-15 MED FILL — AMLODIPINE BESYLATE 10 MG T: 10 | 90 days supply | Qty: 90 | Fill #1

## 2020-03-15 MED FILL — OMRON 3 SERIES BP MONITOR D: 1 days supply | Qty: 1 | Fill #0

## 2020-03-28 ENCOUNTER — Telehealth: Payer: Self-pay

## 2020-03-28 ENCOUNTER — Telehealth: Payer: Self-pay | Admitting: Cardiovascular Disease

## 2020-03-28 NOTE — Telephone Encounter (Signed)
Patient called her vertigo has gotten worse this morning was really bad she couldn't get into the building from her car so her husband had to come get her. She said she takes 1/2 pill of xanax prn, she would like to come in to discuss other medications. She said she going to be filling out FMLA paperwork and [employer] is supposed to be faxing that to [place]. No nausea or vomiting.

## 2020-03-28 NOTE — Telephone Encounter (Signed)
Spoke with pt, she does take claritin for her allergies. Referred patient to her medical doctor for other medications for the vertigo and also for her FMLA. Patient voiced understanding

## 2020-03-28 NOTE — Telephone Encounter (Signed)
We have no appts today. Can see tomorrow and she will likely be referred to Neurologist

## 2020-03-28 NOTE — Telephone Encounter (Signed)
STAT if patient feels like he/she is going to faint   1) Are you dizzy now? Yes, not as bad  2) Do you feel faint or have you passed out? no  3) Do you have any other symptoms? Upset stomach  4) Have you checked your HR and BP (record if available)? At work BP was 150/110 was stressed out, HR 67 now.   Patient states she gets vertigo and today it was much worse. She states she has had it for years. She states it hit today while she was driving to work and she got a sudden cool feeling like she would pass out. She states it passed, but then became dizzy. She states she was able to make it to work, but could not get out of the car. She states she has to have her husband bring her home. She states when the vertigo hits it also affects her stomach. She states she took some pepto and drank some ginger ale and Gatorade. She states she also takes half a tablet of xanax and a natural pill. She states she has not passed out and does not feel like she will. She states she does also go to the bathroom as much as she can to help relieve the upset stomach. She is not sure if it is also her sinus' draining and sometimes she feels like she has fluid in her ears. She states her symptoms have also given her anxiety. She states she needs more permanent help for her vertigo.

## 2020-03-28 NOTE — Telephone Encounter (Signed)
LVM to CB to schedule an appointment 

## 2020-03-28 NOTE — Telephone Encounter (Signed)
[facility] received paperwork via fax from [place], paperwork was given to [employer] nurse on 3/3.

## 2020-03-28 NOTE — Telephone Encounter (Signed)
Scheduled

## 2020-03-29 ENCOUNTER — Ambulatory Visit: Payer: 59 | Admitting: Internal Medicine

## 2020-03-29 ENCOUNTER — Encounter: Payer: Self-pay | Admitting: Internal Medicine

## 2020-03-29 ENCOUNTER — Other Ambulatory Visit: Payer: Self-pay

## 2020-03-29 VITALS — BP 110/60 | HR 77 | Temp 97.7°F | Ht 64.0 in | Wt 208.0 lb

## 2020-03-29 DIAGNOSIS — H6501 Acute serous otitis media, right ear: Secondary | ICD-10-CM | POA: Diagnosis not present

## 2020-03-29 DIAGNOSIS — R42 Dizziness and giddiness: Secondary | ICD-10-CM

## 2020-03-29 DIAGNOSIS — Z8659 Personal history of other mental and behavioral disorders: Secondary | ICD-10-CM | POA: Diagnosis not present

## 2020-03-29 DIAGNOSIS — F411 Generalized anxiety disorder: Secondary | ICD-10-CM | POA: Diagnosis not present

## 2020-03-29 DIAGNOSIS — I10 Essential (primary) hypertension: Secondary | ICD-10-CM | POA: Diagnosis not present

## 2020-03-29 DIAGNOSIS — Z8709 Personal history of other diseases of the respiratory system: Secondary | ICD-10-CM

## 2020-03-29 DIAGNOSIS — H6690 Otitis media, unspecified, unspecified ear: Secondary | ICD-10-CM

## 2020-03-29 MED ORDER — ONDANSETRON HCL 4 MG/2ML IJ SOLN
4.0000 mg | Freq: Once | INTRAMUSCULAR | Status: AC
Start: 2020-03-29 — End: 2020-03-29
  Administered 2020-03-29: 4 mg via INTRAMUSCULAR

## 2020-03-29 MED ORDER — METHYLPREDNISOLONE ACETATE 80 MG/ML IJ SUSP
80.0000 mg | Freq: Once | INTRAMUSCULAR | Status: AC
  Administered 2020-03-29: 80 mg via INTRAMUSCULAR

## 2020-03-29 MED FILL — HYDROCHLOROTHIAZIDE 12.5 MG: 12.5 | 30 days supply | Qty: 30 | Fill #4

## 2020-03-29 MED FILL — LOSARTAN POTASSIUM 100 MG T: 100 | 30 days supply | Qty: 30 | Fill #2

## 2020-04-03 ENCOUNTER — Other Ambulatory Visit: Payer: Self-pay | Admitting: Cardiovascular Disease

## 2020-04-03 DIAGNOSIS — Z1231 Encounter for screening mammogram for malignant neoplasm of breast: Secondary | ICD-10-CM

## 2020-04-03 NOTE — Patient Instructions (Addendum)
Depo-Medrol 80 mg IM.  Patient declined antibiotic therapy.  Was given prescription for Zofran 4 mg tablets to take up to 3 times daily if needed for nausea.  Note given to be out of work.  Referral to Neurology regarding recurrent vertigo.  Longstanding history of anxiety treated with Xanax.

## 2020-04-03 NOTE — Progress Notes (Signed)
   Subjective:    Patient ID: Jodi Bates, female    DOB: 1965-03-06, 55 y.o.   MRN: [id number]  HPI 55 year old Female seen today regarding episode of vertigo on March 28, 2020.  It seems that patient drove herself to work.  She works as a Production manager.  She sat in her car and was so dizzy that she decided she could not go inside to work.  She had her husband come pick her up and take her home.  This caused a lot of stress and anxiety.  She had no frank syncope.  However did feel nauseated.  History of anxiety.  She initially contacted Dr. Oval Linsey who treats her for essential hypertension.  Her staff advised her to contact me.  She has a history of asthma treated by Dr. Neldon Mc.  She is on Protonix for GE reflux symptoms.  Longstanding history of anxiety treated with Xanax.  Says she needs FMLA paperwork completed.  Dr. Radene Knee is GYN physician.  History of open-angle glaucoma which is borderline and myopia followed by [facility].  History of idiopathic urticaria in addition to asthma seen by Dr. Neldon Mc.  Her mother passed away due to complications of VCBSW-96 in a local nursing home and this was very upsetting for the patient.  This happened during 2021.  In 2018 she was seen in the emergency department after an episode of near syncope.  Emergency department physician felt she had vertigo as opposed to near syncope.  She was bradycardic.  Heart rate dropped down into the low 40s.  She was not hypotensive.  Her son was hospitalized at the time with significant injuries after a motor vehicle accident.  Her sleep has been erratic and she was under a lot of stress.    Review of Systems see above-vertigo has improved today.    She says she will need FMLA paperwork completed.  Has had some nausea.     Objective:   Physical Exam Blood pressure 110/60 pulse 77 temperature 97.7 degrees pulse oximetry 97% weight 208 pounds BMI 35.70 height 5 feet 4 inches  Skin  warm and dry.  Nodes none.  PERRLA.  Cranial nerves II through XII are grossly intact.  No nystagmus is demonstrated.  Chest is clear to auscultation.  Cardiac exam regular rate and rhythm normal S1 and S2 without ectopy.  Muscle strength is normal in the upper and lower extremities.  Gait is normal.  Right TM is full but not red       Assessment & Plan:  Episodic vertigo-?  Stress related.    Does not have tinnitus.  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  Seems to have benign positional vertigo.  Will ask Neurology to evaluate for possible Mnire's disease  Acute right serous otitis media  Essential hypertension  Anxiety state-takes  Xanax for longstanding history of anxiety  History of asthma treated by Dr. Neldon Mc  History of hypertension treated by Dr. Oval Linsey at [facility]  Cardiology and stable  Plan: Was given Depo-Medrol 80 mg IM.  Declines antibiotic therapy.  Was also given prescription for Zofran 4 mg tablets to take up to 3 times daily as needed for nausea.  Note given to be out of work.  Patient says she will likely need FMLA intermittent leave paperwork completed.  30 minutes spent with patient

## 2020-04-04 ENCOUNTER — Telehealth: Payer: Self-pay | Admitting: Internal Medicine

## 2020-04-04 NOTE — Telephone Encounter (Addendum)
Faxed Completed FMLA forms to [employer] [phone]  Ref # [id number]

## 2020-04-08 NOTE — Telephone Encounter (Signed)
Patient was advised that PCP should complete paperwork and she has already had it faxed to PCP, she stated not sure why they keep faxing paperwork for [facility]  patient verbalized understanding

## 2020-04-09 ENCOUNTER — Other Ambulatory Visit: Payer: Self-pay | Admitting: Neurology

## 2020-04-09 ENCOUNTER — Encounter: Payer: Self-pay | Admitting: Neurology

## 2020-04-09 ENCOUNTER — Ambulatory Visit: Payer: 59 | Admitting: Neurology

## 2020-04-09 VITALS — BP 148/94 | HR 83 | Ht 64.0 in | Wt 205.0 lb

## 2020-04-09 DIAGNOSIS — G43109 Migraine with aura, not intractable, without status migrainosus: Secondary | ICD-10-CM

## 2020-04-09 DIAGNOSIS — R42 Dizziness and giddiness: Secondary | ICD-10-CM | POA: Diagnosis not present

## 2020-04-09 HISTORY — DX: Migraine with aura, not intractable, without status migrainosus: G43.109

## 2020-04-09 MED ORDER — RIZATRIPTAN BENZOATE 10 MG PO TBDP: 10.0000 mg | ORAL_TABLET | Freq: Three times a day (TID) | ORAL | 3 refills | Status: DC | PRN

## 2020-04-09 NOTE — Progress Notes (Signed)
Reason for visit: Vertigo, headache  Referring physician: Dr. Anabel Halon Tiarah Bates is a 55 y.o. female  History of present illness:  Jodi Bates is a 55 year old right-handed black female with a history of migraine headaches since she was a teenager.  The patient has a prominent family history for migraine, her son and daughter and her mother also have headaches.  The patient has been seen and evaluated through this office in 2013 for episodes of vertigo and headache that have occurred together since 2008.  The patient had an event about 10 days ago with more severe symptoms.  She was driving to work and started having some problems with vertigo with a spinning sensation and associated nausea without vomiting.  The patient was off balance when she try to get to work and needed help to get to work and had to leave early.  She developed a bifrontal headache which is typical for these events.  The patient at times may have a feeling of muffled hearing in the right greater than left ear, she may at times have some tinnitus that may occur with these events.  The patient denies any double vision or loss of vision, she denies any slurred speech or problems swallowing.  She has never had any syncope.  She denies any cognitive clouding with the events.  The patient denies any problems with neck stiffness.  She may take alprazolam and Pepto-Bismol for the events.  She has never been on any medications for migraine.  She does not drink any caffeinated products during the day.  She is sent to this office for further evaluation of the above events.  She was seen by Dr. Leta Bates through this office in 2013 for episodes of vertigo, MRI of the brain at that time was unremarkable.  Past Medical History:  Diagnosis Date  . Allergy   . Anxiety   . Asthma    prn inhalers  . GERD (gastroesophageal reflux disease)   . 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 Headache(784.0)     tension  . Hypertension    under control with meds., has been on med. > 5 yr.  . Iron deficiency anemia    no current problems or meds.    Past Surgical History:  Procedure Laterality Date  . BREAST CYST EXCISION    . BREAST EXCISIONAL BIOPSY Left   . BREAST SURGERY      lt breast/ benign cyst  . CYST EXCISION     back of head  . HYSTEROSCOPY WITH D & C  06/25/2004  . INCISION AND DRAINAGE PERIRECTAL ABSCESS N/A 02/10/2013   Procedure: IRRIGATION AND DEBRIDEMENT PERIRECTAL ABSCESS;  Surgeon: Gwenyth Ober, MD;  Location: [facility];  Service: General;  Laterality: N/A;  . LAPAROSCOPIC ENDOMETRIOSIS FULGURATION    . PILONIDAL CYST EXCISION N/A 04/28/2012   Procedure: exam under anesthesia and exicision of pilonidal;  Surgeon: Gwenyth Ober, MD;  Location: [facility];  Service: General;  Laterality: N/A;  . POLYPECTOMY     uterine  . TUBAL LIGATION      Family History  Problem Relation Age of Onset  . Diabetes Mother   . Hypertension Mother   . Allergic rhinitis Mother   . Heart failure Mother   . Cancer Father        deceased metastaic throat cancer  . Esophageal cancer Father   . Diabetes Brother   . Pancreatic cancer Brother   .  Asthma Brother   . Sleep apnea Brother   . Cancer Maternal Grandmother        breast  . Breast cancer Maternal Grandmother   . Allergic rhinitis Son   . Asthma Son   . Colon cancer Neg Hx   . Rectal cancer Neg Hx   . Stomach cancer Neg Hx   . Colon polyps Neg Hx     Social history:  reports that she has never smoked. She has never used smokeless tobacco. She reports that she does not drink alcohol and does not use drugs.  Medications:  Prior to Admission medications   Medication Sig Start Date End Date Taking? Authorizing Provider  albuterol (PROVENTIL) (2.5 MG/3ML) 0.083% nebulizer solution Take 3 mLs by nebulization every 6 (six) hours as needed for wheezing or shortness of breath.    Yes [provider]  albuterol  (VENTOLIN HFA) 108 (90 Base) MCG/ACT inhaler Inhale 2 puffs into the lungs every 4 (four) hours as needed for wheezing or shortness of breath. 02/03/20  Yes Wieters, Hallie C, PA-C  ALPRAZolam (XANAX) 0.5 MG tablet Take 1 tablet (0.5 mg total) by mouth 3 (three) times daily. 07/06/19  Yes Baxley, Cresenciano Lick, MD  amLODipine (NORVASC) 10 MG tablet Take 1 tablet (10 mg total) by mouth daily. 10/20/19  Yes [name], MD  escitalopram (LEXAPRO) 20 MG tablet Take 20 mg by mouth at bedtime.    Yes [provider]  fluticasone furoate-vilanterol (BREO ELLIPTA) 100-25 MCG/INH AEPB Inhale 1 puff into the lungs daily. INHALE 1 PUFF BY MOUTH DAILY. 04/25/19  Yes Kozlow, Donnamarie Poag, MD  hydrochlorothiazide (MICROZIDE) 12.5 MG capsule Take 1 capsule (12.5 mg total) by mouth daily. Please make appointment for further refills, thanks! 10/17/19  Yes [name], MD  loratadine (CLARITIN) 10 MG tablet Take 10 mg by mouth daily.   Yes [provider]  losartan (COZAAR) 100 MG tablet Take 1 tablet (100 mg total) by mouth daily. 12/08/19  Yes [name], MD  pantoprazole (PROTONIX) 40 MG tablet Take 1 tablet (40 mg total) by mouth daily. 12/14/19   Elby Showers, MD      Allergies  Allergen Reactions  . Adhesive [Tape] Itching and Other (See Comments)    Reaction:  Blisters   . Latex Itching and Rash  . Soap Other (See Comments)    Soaps that contain any kind of fragrance triggers an asthma attack.    . Other     Fragrances     ROS:  Out of a complete 14 system review of symptoms, the patient complains only of the following symptoms, and all other reviewed systems are negative.  Vertigo Headache Nausea  Blood pressure (!) 148/94, pulse 83, height 5\' 4"  (1.626 m), weight 205 lb (93 kg).  Physical Exam  General: The patient is alert and cooperative at the time of the examination.  Eyes: Pupils are equal, round, and reactive to light. Discs are flat bilaterally.  Ears:  Tympanic membranes are clear bilaterally.  Neck: The neck is supple, no carotid bruits are noted.  Respiratory: The respiratory examination is clear.  Cardiovascular: The cardiovascular examination reveals a regular rate and rhythm, no obvious murmurs or rubs are noted.  Skin: Extremities are without significant edema.  Neurologic Exam  Mental status: The patient is alert and oriented x 3 at the time of the examination. The patient has apparent normal recent and remote memory, with an apparently normal attention span and concentration ability.  Cranial nerves: Facial symmetry is present. There is good sensation of the face to pinprick and soft touch bilaterally. The strength of the facial muscles and the muscles to head turning and shoulder shrug are normal bilaterally. Speech is well enunciated, no aphasia or dysarthria is noted. Extraocular movements are full. Visual fields are full. The tongue is midline, and the patient has symmetric elevation of the soft palate. No obvious hearing deficits are noted.  Motor: The motor testing reveals 5 over 5 strength of all 4 extremities. Good symmetric motor tone is noted throughout.  Sensory: Sensory testing is intact to pinprick, soft touch, vibration sensation, and position sense on all 4 extremities. No evidence of extinction is noted.  Coordination: Cerebellar testing reveals good finger-nose-finger and heel-to-shin bilaterally.  Gait and station: Gait is normal. Tandem gait is normal. Romberg is negative. No drift is seen.  Reflexes: Deep tendon reflexes are symmetric and normal bilaterally. Toes are downgoing bilaterally.   MRI brain 01/14/12:  IMPRESSION:   1. 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  2. No evidence for acute or subacute infarction.  3. Minimal white matter changes are slightly advanced for age. The  finding is nonspecific but can be seen in the setting of chronic   microvascular ischemia, a demyelinating process such as multiple  sclerosis, vasculitis, complicated migraine headaches, or as the  sequelae of a prior infectious or inflammatory process.  * MRI scan images were reviewed online. I agree with the written report.    Assessment/Plan:  1.  Migraine headache, vertigo as aura  The patient has a longstanding history of migraine with a very prominent family history of migraine.  The patient is having episodes of vertigo and headache consistent with migraine headache.  She claims to have episodes 2-3 times a month, the episodes may last several hours.  We will not initiate a daily prophylactic medication currently, I will give her orally disintegrating Maxalt to take as soon as the symptoms start.  If her headache frequency worsens, a daily medication may be indicated.  She will follow up here in 4 months.  Jodi Alexanders MD 04/09/2020 8:01 AM  [facility] [address] [place], [place] [address]  Phone [phone] Fax [phone]

## 2020-04-09 NOTE — Telephone Encounter (Signed)
Faxed for Completion again to [phone] on 04/09/2020 at 11:25 am   Ref # [id number]

## 2020-04-09 NOTE — Telephone Encounter (Signed)
Faxed form completion again on 04/08/2020 at 4:30 PM to [phone]  Ref # [id number]

## 2020-04-12 NOTE — Telephone Encounter (Signed)
Mailed original FMLA forms to patient and sent copies to be scanned into chart

## 2020-04-23 ENCOUNTER — Encounter: Payer: Self-pay | Admitting: Allergy and Immunology

## 2020-04-23 ENCOUNTER — Other Ambulatory Visit: Payer: Self-pay | Admitting: Allergy and Immunology

## 2020-04-23 ENCOUNTER — Ambulatory Visit: Payer: 59 | Admitting: Allergy and Immunology

## 2020-04-23 ENCOUNTER — Other Ambulatory Visit: Payer: Self-pay

## 2020-04-23 VITALS — BP 118/82 | HR 68 | Temp 97.3°F | Resp 16

## 2020-04-23 DIAGNOSIS — J3089 Other allergic rhinitis: Secondary | ICD-10-CM | POA: Diagnosis not present

## 2020-04-23 DIAGNOSIS — J4540 Moderate persistent asthma, uncomplicated: Secondary | ICD-10-CM | POA: Diagnosis not present

## 2020-04-23 MED ORDER — BREO ELLIPTA 100-25 MCG/INH IN AEPB: 1.0000 | INHALATION_SPRAY | Freq: Every day | RESPIRATORY_TRACT | 5 refills | Status: DC

## 2020-04-23 MED ORDER — LORATADINE 10 MG PO TABS: 10.0000 mg | ORAL_TABLET | Freq: Every day | ORAL | 5 refills | Status: DC

## 2020-04-23 MED FILL — BREO ELLIPTA 100-25 MCG INH: 100-25 | 30 days supply | Qty: 60 | Fill #0

## 2020-04-23 NOTE — Progress Notes (Signed)
[facility] [place] [facility] [place]   Follow-up Note  Referring Provider: Elby Showers, MD Primary Provider: Elby Showers, MD Date of Office Visit: 04/23/2020  Subjective:   Jodi Bates (DOB: 01/27/1965) is a 55 y.o. female who returns to the [facility] on 04/23/2020 in re-evaluation of the following:  HPI: Jodi Bates returns to this clinic in reevaluation of asthma and allergic rhinoconjunctivitis and LPR.  Her last visit to this clinic was 25 April 2019.  She has really done well since her last visit and has had no significant issues with her respiratory tract.  She can exercise without any difficulty and rarely uses a short acting bronchodilator and has not required either systemic steroid or an antibiotic for any type of airway issue.  She is now using Breo intermittently and she is using a nasal steroid intermittently.  Her reflux has melted away since she has lost 43 pounds over the course of the past 2 years.  She does not require proton pump inhibitor.  She has received 3 [place] vaccinations and a flu vaccine.  She has started a triptan for intermittent vertiginous migraines.  Allergies as of 04/23/2020      Reactions   Adhesive [tape] Itching, Other (See Comments)   Reaction:  Blisters    Latex Itching, Rash   Soap Other (See Comments)   Soaps that contain any kind of fragrance triggers an asthma attack.     Other    Fragrances      Medication List      albuterol (2.5 MG/3ML) 0.083% nebulizer solution Commonly known as: PROVENTIL Take 3 mLs by nebulization every 6 (six) hours as needed for wheezing or shortness of breath.   albuterol 108 (90 Base) MCG/ACT inhaler Commonly known as: Ventolin HFA Inhale 2 puffs into the lungs every 4 (four) hours as needed for wheezing or shortness of breath.   ALPRAZolam 0.5 MG tablet Commonly known as: Xanax Take 1 tablet (0.5 mg total) by mouth 3 (three) times daily.    amLODipine 10 MG tablet Commonly known as: NORVASC Take 1 tablet (10 mg total) by mouth daily.   Breo Ellipta 100-25 MCG/INH Aepb Generic drug: fluticasone furoate-vilanterol Inhale 1 puff into the lungs daily. INHALE 1 PUFF BY MOUTH DAILY.   escitalopram 20 MG tablet Commonly known as: LEXAPRO Take 20 mg by mouth at bedtime.   hydrochlorothiazide 12.5 MG capsule Commonly known as: MICROZIDE Take 1 capsule (12.5 mg total) by mouth daily. Please make appointment for further refills, thanks!   loratadine 10 MG tablet Commonly known as: CLARITIN Take 10 mg by mouth daily.   losartan 100 MG tablet Commonly known as: COZAAR Take 1 tablet (100 mg total) by mouth daily.   rizatriptan 10 MG disintegrating tablet Commonly known as: MAXALT-MLT Take 1 tablet (10 mg total) by mouth 3 (three) times daily as needed for migraine. May repeat in 2 hours if needed       Past Medical History:  Diagnosis Date  . Allergy   . Anxiety   . Asthma    prn inhalers  . GERD (gastroesophageal reflux disease)   . 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 Headache(784.0)    tension  . Hypertension    under control with meds., has been on med. > 5 yr.  . Iron deficiency anemia    no current problems or meds.  . Migraine headache with aura  04/09/2020    Past Surgical History:  Procedure Laterality Date  . BREAST CYST EXCISION    . BREAST EXCISIONAL BIOPSY Left   . BREAST SURGERY      lt breast/ benign cyst  . CYST EXCISION     back of head  . HYSTEROSCOPY WITH D & C  06/25/2004  . INCISION AND DRAINAGE PERIRECTAL ABSCESS N/A 02/10/2013   Procedure: IRRIGATION AND DEBRIDEMENT PERIRECTAL ABSCESS;  Surgeon: Gwenyth Ober, MD;  Location: [facility];  Service: General;  Laterality: N/A;  . LAPAROSCOPIC ENDOMETRIOSIS FULGURATION    . PILONIDAL CYST EXCISION N/A 04/28/2012   Procedure: exam under anesthesia and exicision of pilonidal;  Surgeon: Gwenyth Ober, MD;  Location:  [facility];  Service: General;  Laterality: N/A;  . POLYPECTOMY     uterine  . TUBAL LIGATION      Review of systems negative except as noted in HPI / PMHx or noted below:  Review of Systems  Constitutional: Negative.   HENT: Negative.   Eyes: Negative.   Respiratory: Negative.   Cardiovascular: Negative.   Gastrointestinal: Negative.   Genitourinary: Negative.   Musculoskeletal: Negative.   Skin: Negative.   Neurological: Negative.   Endo/Heme/Allergies: Negative.   Psychiatric/Behavioral: Negative.      Objective:   Vitals:   04/23/20 1034  BP: 118/82  Pulse: 68  Resp: 16  Temp: (!) 97.3 F (36.3 C)  SpO2: 97%          Physical Exam Constitutional:      Appearance: She is not diaphoretic.  HENT:     Head: Normocephalic.     Right Ear: Tympanic membrane, ear canal and external ear normal.     Left Ear: Tympanic membrane, ear canal and external ear normal.     Nose: Nose normal. No mucosal edema or rhinorrhea.     Mouth/Throat:     Pharynx: Uvula midline. No oropharyngeal exudate.  Eyes:     Conjunctiva/sclera: Conjunctivae normal.  Neck:     Thyroid: No thyromegaly.     Trachea: Trachea normal. No tracheal tenderness or tracheal deviation.  Cardiovascular:     Rate and Rhythm: Normal rate and regular rhythm.     Heart sounds: Normal heart sounds, S1 normal and S2 normal. No murmur heard.   Pulmonary:     Effort: No respiratory distress.     Breath sounds: Normal breath sounds. No stridor. No wheezing or rales.  Lymphadenopathy:     Head:     Right side of head: No tonsillar adenopathy.     Left side of head: No tonsillar adenopathy.     Cervical: No cervical adenopathy.  Skin:    Findings: No erythema or rash.     Nails: There is no clubbing.  Neurological:     Mental Status: She is alert.     Diagnostics:    Spirometry was performed and demonstrated an FEV1 of 1.80 at 80 % of predicted.  Assessment and Plan:   1. Asthma,  moderate persistent, well-controlled   2. Other allergic rhinitis     1. Continue to Treat and prevent inflammation:   A. Flonase 1-2 sprays each nostril 1-7 times per week depending on disease activity  B. Breo 100 - 1 inhalation 1- 7 times per week depending on disease activity  2. If needed:   A. Ventolin HFA 2 puffs every 4-6 hours  B. OTC antihistamine - Claritin/Allegra/Zyrtec  3.  Return to clinic in 12 months  or earlier if problem  Huxley appears to be doing very well with her current plan.  She has a very good understanding about her disease state and how her medications work and appropriate dosing of her medications depending on disease activity.  We will continue to have her utilize the plan noted above and I will see her back in this clinic in 1 year or earlier if there is a problem.  Allena Katz, MD Allergy / Immunology [place]

## 2020-04-23 NOTE — Patient Instructions (Addendum)
  1. Continue to Treat and prevent inflammation:   A. Flonase 1-2 sprays each nostril 1-7 times per week depending on disease activity  B. Breo 100 - 1 inhalation 1- 7 times per week depending on disease activity  2. If needed:   A. Ventolin HFA 2 puffs every 4-6 hours  B. OTC antihistamine - Claritin/Allegra/Zyrtec  3.  Return to clinic in 12 months or earlier if problem

## 2020-04-24 ENCOUNTER — Encounter: Payer: Self-pay | Admitting: Allergy and Immunology

## 2020-05-03 ENCOUNTER — Other Ambulatory Visit: Payer: Self-pay | Admitting: Internal Medicine

## 2020-05-03 ENCOUNTER — Other Ambulatory Visit (HOSPITAL_COMMUNITY): Payer: Self-pay

## 2020-05-03 MED ORDER — ALPRAZOLAM 0.5 MG PO TABS
0.5000 mg | ORAL_TABLET | Freq: Three times a day (TID) | ORAL | 1 refills | Status: DC
  Filled 2020-05-03: qty 90, 30d supply, fill #0

## 2020-05-03 MED FILL — Losartan Potassium Tab 100 MG: ORAL | 90 days supply | Qty: 90 | Fill #0 | Status: AC

## 2020-05-03 MED FILL — Fluticasone Furoate-Vilanterol Aero Powd BA 100-25 MCG/ACT: RESPIRATORY_TRACT | 60 days supply | Qty: 60 | Fill #0 | Status: CN

## 2020-05-03 MED FILL — Hydrochlorothiazide Cap 12.5 MG: ORAL | 90 days supply | Qty: 90 | Fill #0 | Status: AC

## 2020-05-03 MED FILL — Escitalopram Oxalate Tab 20 MG (Base Equiv): ORAL | 90 days supply | Qty: 90 | Fill #0 | Status: AC

## 2020-05-28 ENCOUNTER — Other Ambulatory Visit: Payer: Self-pay

## 2020-05-28 ENCOUNTER — Ambulatory Visit
Admission: RE | Admit: 2020-05-28 | Discharge: 2020-05-28 | Disposition: A | Discharge status: Home / Self Care | Payer: 59 | Source: Ambulatory Visit | Attending: Cardiovascular Disease | Admitting: Cardiovascular Disease

## 2020-05-28 DIAGNOSIS — Z1231 Encounter for screening mammogram for malignant neoplasm of breast: Secondary | ICD-10-CM | POA: Diagnosis not present

## 2020-05-29 ENCOUNTER — Telehealth: Payer: Self-pay | Admitting: Neurology

## 2020-05-29 NOTE — Telephone Encounter (Signed)
Pt called wanting to make sure that her mychart message has come through. Pt states she is needing to speak to the nurse or provider soon due to not feeling well at all.

## 2020-06-03 ENCOUNTER — Other Ambulatory Visit (HOSPITAL_COMMUNITY): Payer: Self-pay

## 2020-06-05 ENCOUNTER — Telehealth: Payer: Self-pay | Admitting: Neurology

## 2020-06-05 ENCOUNTER — Other Ambulatory Visit (HOSPITAL_COMMUNITY): Payer: Self-pay

## 2020-06-05 MED ORDER — TOPIRAMATE 25 MG PO TABS
ORAL_TABLET | ORAL | 3 refills | Status: DC
  Filled 2020-06-05: qty 90, 30d supply, fill #0
  Filled 2020-07-09: qty 90, 30d supply, fill #1

## 2020-06-05 NOTE — Addendum Note (Signed)
Addended by: Kathrynn Ducking on: 06/05/2020 11:28 AM   Modules accepted: Orders

## 2020-06-05 NOTE — Telephone Encounter (Signed)
I called the patient.  The Topamax prescription will be sent in now.

## 2020-06-05 NOTE — Telephone Encounter (Signed)
Pt states Dr Jannifer Franklin is supposed to start her on Topomax, she is asking for a call re: when this will be called into her pharmacy

## 2020-06-06 ENCOUNTER — Telehealth: Payer: Self-pay | Admitting: *Deleted

## 2020-06-06 DIAGNOSIS — Z0289 Encounter for other administrative examinations: Secondary | ICD-10-CM

## 2020-06-06 NOTE — Telephone Encounter (Signed)
Pt fmla form faxed and copy @ the front desk for p/u

## 2020-06-06 NOTE — Telephone Encounter (Signed)
Paperwork received back signed by Dr. Jannifer Franklin and given to [facility] in [place].

## 2020-06-25 ENCOUNTER — Telehealth: Payer: Self-pay | Admitting: Neurology

## 2020-06-25 ENCOUNTER — Encounter: Payer: Self-pay | Admitting: Neurology

## 2020-06-25 NOTE — Telephone Encounter (Signed)
Pt called, need a letter stating have vertigo. Gym has ask for letter to verify why gym membership should be cancelled. Would like a call from the nurse.

## 2020-06-25 NOTE — Telephone Encounter (Signed)
I called the patient.  The patient is unable to use a gym membership due to ongoing vertigo, I will dictate a letter in this regard.

## 2020-07-09 ENCOUNTER — Other Ambulatory Visit (HOSPITAL_COMMUNITY): Payer: Self-pay

## 2020-07-09 MED FILL — Amlodipine Besylate Tab 10 MG (Base Equivalent): ORAL | 90 days supply | Qty: 90 | Fill #0 | Status: AC

## 2020-07-12 ENCOUNTER — Other Ambulatory Visit (HOSPITAL_COMMUNITY): Payer: Self-pay

## 2020-07-18 ENCOUNTER — Telehealth: Payer: Self-pay | Admitting: Neurology

## 2020-07-18 NOTE — Telephone Encounter (Signed)
Returned patient's call back, she said she is having trouble with dizzy spells and unable to work.  Sarah nor Dr. Jannifer Franklin has any earlier appointments than her already scheduled 7/19.  She has been placed on the call list.  Advised patient if she continues with the dizziness or has any sudden vision changes, weakness, n/v or other symptoms, she should go to the ED for work up.  Patient denied further questions, verbalized understanding and expressed appreciation for the phone call.

## 2020-07-18 NOTE — Telephone Encounter (Signed)
Called and LVM for patient to return call. 

## 2020-07-18 NOTE — Telephone Encounter (Signed)
Pt called, having frequently more vertigo, taking the Topamax. Need a sooner appt, can I be worked in? Would like a call from the nurse.

## 2020-07-22 NOTE — Telephone Encounter (Signed)
I do not think Eustachian tube dysfunction is causing the vertigo. Tubes are not usually placed very often in adults anymore. In my experience, vertigo is often associated with stress. Dr. Jannifer Franklin is very good. Do you need counseling for stress? Would the Neurology office place you on a cancellation list? You may discuss ENT referral with Dr. Jannifer Franklin but these are my thoughts.

## 2020-08-04 MED FILL — Losartan Potassium Tab 100 MG: ORAL | 30 days supply | Qty: 30 | Fill #1 | Status: AC

## 2020-08-05 ENCOUNTER — Other Ambulatory Visit (HOSPITAL_COMMUNITY): Payer: Self-pay

## 2020-08-07 ENCOUNTER — Other Ambulatory Visit: Payer: Self-pay | Admitting: Cardiovascular Disease

## 2020-08-07 ENCOUNTER — Other Ambulatory Visit (HOSPITAL_COMMUNITY): Payer: Self-pay

## 2020-08-07 MED ORDER — LOSARTAN POTASSIUM 100 MG PO TABS
100.0000 mg | ORAL_TABLET | Freq: Every day | ORAL | 0 refills | Status: DC
  Filled 2020-08-07: qty 90, 90d supply, fill #0

## 2020-08-12 ENCOUNTER — Other Ambulatory Visit (HOSPITAL_COMMUNITY): Payer: Self-pay

## 2020-08-12 ENCOUNTER — Telehealth: Payer: Self-pay | Admitting: Cardiovascular Disease

## 2020-08-12 ENCOUNTER — Other Ambulatory Visit: Payer: Self-pay

## 2020-08-12 MED ORDER — HYDROCHLOROTHIAZIDE 12.5 MG PO CAPS
12.5000 mg | ORAL_CAPSULE | Freq: Every day | ORAL | 3 refills | Status: DC
  Filled 2020-08-12: qty 90, 90d supply, fill #0
  Filled 2020-11-19: qty 90, 90d supply, fill #1
  Filled 2021-02-12: qty 90, 90d supply, fill #2

## 2020-08-12 MED ORDER — LOSARTAN POTASSIUM 100 MG PO TABS
100.0000 mg | ORAL_TABLET | Freq: Every day | ORAL | 0 refills | Status: DC
  Filled 2020-08-12 – 2020-08-30 (×2): qty 90, 90d supply, fill #0

## 2020-08-12 MED ORDER — AMLODIPINE BESYLATE 10 MG PO TABS
10.0000 mg | ORAL_TABLET | Freq: Every day | ORAL | 3 refills | Status: DC
  Filled 2020-08-12: qty 90, fill #0
  Filled 2020-11-10: qty 90, 90d supply, fill #0
  Filled 2021-02-12: qty 90, 90d supply, fill #1
  Filled 2021-05-19: qty 90, 90d supply, fill #2

## 2020-08-12 NOTE — Progress Notes (Signed)
PATIENT: Jodi Bates DOB: [birth year]  REASON FOR VISIT: follow up HISTORY FROM: patient Primary Neurologist: Dr. Jannifer Franklin   HISTORY OF PRESENT ILLNESS: Today 08/13/20 Jodi Bates is a 55 year old female with history of migraine headache with vertigo.  Is on Topamax, Maxalt as needed. Has the dizziness sensation then develops bifrontal headache. Doing well with Topamax 75 mg at bedtime. Less spells, noticed it is correlated with diet. June 23rd last spell. No side effects from the Topamax. Stopped the Maxalt, only took 1/2 tablet, made her head feel like vice grip. It did make the headache go away. Claims is 90% better. No changes to medical history. 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  Overall, much improved.  Here today alone.  HISTORY  04/09/2020 Dr. Jannifer Franklin: [name] is a 55 year old right-handed black female with a history of migraine headaches since she was a teenager.  The patient has a prominent family history for migraine, her son and daughter and her mother also have headaches.  The patient has been seen and evaluated through this office in 2013 for episodes of vertigo and headache that have occurred together since 2008.  The patient had an event about 10 days ago with more severe symptoms.  She was driving to work and started having some problems with vertigo with a spinning sensation and associated nausea without vomiting.  The patient was off balance when she try to get to work and needed help to get to work and had to leave early.  She developed a bifrontal headache which is typical for these events.  The patient at times may have a feeling of muffled hearing in the right greater than left ear, she may at times have some tinnitus that may occur with these events.  The patient denies any double vision or loss of vision, she denies any slurred speech or problems swallowing.  She has never had any syncope.  She denies any cognitive clouding with the events.  The patient denies any  problems with neck stiffness.  She may take alprazolam and Pepto-Bismol for the events.  She has never been on any medications for migraine.  She does not drink any caffeinated products during the day.  She is sent to this office for further evaluation of the above events.  She was seen by Dr. Leta Baptist through this office in 2013 for episodes of vertigo, MRI of the brain at that time was unremarkable.  REVIEW OF SYSTEMS: Out of a complete 14 system review of symptoms, the patient complains only of the following symptoms, and all other reviewed systems are negative.  See HPI  ALLERGIES: Allergies  Allergen Reactions   Adhesive [Tape] Itching and Other (See Comments)    Reaction:  Blisters    Latex Itching and Rash   Soap Other (See Comments)    Soaps that contain any kind of fragrance triggers an asthma attack.     Other     Fragrances     HOME MEDICATIONS: Outpatient Medications Prior to Visit  Medication Sig Dispense Refill   albuterol (PROVENTIL) (2.5 MG/3ML) 0.083% nebulizer solution Take 3 mLs by nebulization every 6 (six) hours as needed for wheezing or shortness of breath.   1   albuterol (VENTOLIN HFA) 108 (90 Base) MCG/ACT inhaler INHALE 2 PUFFS INTO THE LUNGS EVERY 4 HOURS AS NEEDED FOR WHEEZING OR SHORTNESSS OF BREATH 8.5 g 1   ALPRAZolam (XANAX) 0.5 MG tablet Take 1 tablet (0.5 mg total) by mouth 3 (three) times daily. 90 tablet  1   amLODipine (NORVASC) 10 MG tablet Take 1 tablet (10 mg total) by mouth daily. 90 tablet 3   Blood Pressure Monitoring (OMRON 3 SERIES BP MONITOR) DEVI USE AS DIRECTED 1 each 0   escitalopram (LEXAPRO) 20 MG tablet TAKE 1 TABLET BY MOUTH ONCE DAILY 90 tablet 4   fluticasone furoate-vilanterol (BREO ELLIPTA) 100-25 MCG/INH AEPB INHALE 1 PUFF BY MOUTH DAILY. 60 each 5   hydrochlorothiazide (MICROZIDE) 12.5 MG capsule TAKE 1 CAPSULE (12.5 MG TOTAL) BY MOUTH DAILY. PLEASE MAKE APPOINTMENT FOR FURTHER REFILLS, THANKS! 90 capsule 3   loratadine  (CLARITIN) 10 MG tablet TAKE 1 TABLET (10 MG TOTAL) BY MOUTH DAILY. 30 tablet 5   losartan (COZAAR) 100 MG tablet TAKE 1 TABLET (100 MG TOTAL) BY MOUTH DAILY. 90 tablet 0   topiramate (TOPAMAX) 25 MG tablet Take one tablet by mouth at night for one week, then take 2 tablets at night for one week, then take 3 tablets at night. 90 tablet 3   benzonatate (TESSALON) 200 MG capsule TAKE 1 CAPSULE BY MOUTH 3 TIMES A DAY FOR UP TO 7 DAYS AS NEEDED FOR COUGH 28 capsule 0   escitalopram (LEXAPRO) 20 MG tablet Take 20 mg by mouth at bedtime.   8   predniSONE (DELTASONE) 20 MG tablet TAKE 2 TABLETS BY MOUTH DAILY WITH BREAKFAST FOR 5 DAYS 10 tablet 0   rizatriptan (MAXALT-MLT) 10 MG disintegrating tablet TAKE 1 TABLET (10 MG TOTAL) BY MOUTH 3 (THREE) TIMES DAILY AS NEEDED FOR MIGRAINE. MAY REPEAT IN 2 HOURS IF NEEDED 10 tablet 3   No facility-administered medications prior to visit.    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 > 5 yr.   Iron deficiency anemia    no current problems or meds.   Migraine headache with aura 04/09/2020    PAST SURGICAL HISTORY: Past Surgical History:  Procedure Laterality Date   BREAST CYST EXCISION     BREAST EXCISIONAL BIOPSY Left    BREAST SURGERY      lt breast/ benign cyst   CYST EXCISION     back of head   HYSTEROSCOPY WITH D & C  06/25/2004   INCISION AND DRAINAGE PERIRECTAL ABSCESS N/A 02/10/2013   Procedure: IRRIGATION AND DEBRIDEMENT PERIRECTAL ABSCESS;  Surgeon: Gwenyth Ober, MD;  Location: [facility];  Service: General;  Laterality: N/A;   LAPAROSCOPIC ENDOMETRIOSIS FULGURATION     PILONIDAL CYST EXCISION N/A 04/28/2012   Procedure: exam under anesthesia and exicision of pilonidal;  Surgeon:  Gwenyth Ober, MD;  Location: [facility];  Service: General;  Laterality: N/A;   POLYPECTOMY     uterine   TUBAL LIGATION      FAMILY HISTORY: Family History  Problem Relation Age of Onset   Diabetes Mother    Hypertension Mother    Allergic rhinitis Mother    Heart failure Mother    Cancer Father        deceased metastaic throat cancer   Esophageal cancer Father    Diabetes Brother    Pancreatic cancer Brother    Asthma Brother    Sleep apnea Brother  Cancer Maternal Grandmother        breast   Breast cancer Maternal Grandmother    Allergic rhinitis Son    Asthma Son    Colon cancer Neg Hx    Rectal cancer Neg Hx    Stomach cancer Neg Hx    Colon polyps Neg Hx     SOCIAL HISTORY: Social History   Socioeconomic History   Marital status: Married    Spouse name: Not on file   Number of children: Not on file   Years of education: Not on file   Highest education level: Not on file  Occupational History   Occupation: full time  Tobacco Use   Smoking status: Never   Smokeless tobacco: Never  Vaping Use   Vaping Use: Never used  Substance and Sexual Activity   Alcohol use: No   Drug use: No   Sexual activity: Yes    Partners: Male    Birth control/protection: None  Other Topics Concern   Not on file  Social History Narrative   Lives with husband   Right Handed   Drinks no caffeine   Social Determinants of Health   Financial Resource Strain: Not on file  Food Insecurity: Not on file  Transportation Needs: Not on file  Physical Activity: Not on file  Stress: Not on file  Social Connections: Not on file  Intimate Partner Violence: Not on file   PHYSICAL EXAM  Vitals:   08/13/20 0839  BP: 129/81  Pulse: (!) 58  Weight: 210 lb 8 oz (95.5 kg)  Height: 5\' 4"  (1.626 m)   Body mass index is 36.13 kg/m.  Generalized: Well developed, in no acute distress   Neurological examination  Mentation: Alert oriented to time, place, history  taking. Follows all commands speech and language fluent Cranial nerve II-XII: Pupils were equal round reactive to light. Extraocular movements were full, visual field were full on confrontational test. Facial sensation and strength were normal. Head turning and shoulder shrug  were normal and symmetric. Motor: The motor testing reveals 5 over 5 strength of all 4 extremities. Good symmetric motor tone is noted throughout.  Sensory: Sensory testing is intact to soft touch on all 4 extremities. No evidence of extinction is noted.  Coordination: Cerebellar testing reveals good finger-nose-finger and heel-to-shin bilaterally.  Gait and station: Gait is normal. Tandem gait is normal.  Reflexes: Deep tendon reflexes are symmetric and normal bilaterally.   DIAGNOSTIC DATA (LABS, IMAGING, TESTING) - I reviewed patient records, labs, notes, testing and imaging myself where available.  Lab Results  Component Value Date   WBC 3.6 (L) 12/14/2019   HGB 13.4 12/14/2019   HCT 41.3 12/14/2019   MCV 90.6 12/14/2019   PLT 208 12/14/2019      Component Value Date/Time   NA 141 12/14/2019 1602   K 4.4 12/14/2019 1602   CL 104 12/14/2019 1602   CO2 29 12/14/2019 1602   GLUCOSE 82 12/14/2019 1602   BUN 14 12/14/2019 1602   CREATININE 0.73 12/14/2019 1602   CALCIUM 9.3 12/14/2019 1602   PROT 6.3 12/14/2019 1602   ALBUMIN 4.1 03/25/2015 0905   AST 19 12/14/2019 1602   ALT 14 12/14/2019 1602   ALKPHOS 53 03/25/2015 0905   BILITOT 0.4 12/14/2019 1602   GFRNONAA 93 12/14/2019 1602   GFRAA 108 12/14/2019 1602   Lab Results  Component Value Date   CHOL 184 03/25/2015   HDL 58 03/25/2015   LDLCALC 110 03/25/2015  TRIG 81 03/25/2015   CHOLHDL 3.2 03/25/2015   Lab Results  Component Value Date   HGBA1C 5.4 03/25/2015   No results found for: VITAMINB12 Lab Results  Component Value Date   TSH 2.11 03/25/2015   ASSESSMENT AND PLAN 55 y.o. year old female  has a past medical history of Allergy,  Anxiety, Asthma, GERD (gastroesophageal reflux disease), Gluteal cleft wound (03/2012), Headache(784.0), Hypertension, Iron deficiency anemia, and Migraine headache with aura (04/09/2020). here with:  1.  Migraine headache, vertigo as aura  -90% improved -Will continue Topamax 75 mg at bedtime -Stop Maxalt, switch to Imitrex, will do low-dose 25 mg as needed, worries about side effect, take at onset of dizziness -Follow-up in 8 months or sooner if needed, encouraged to reach out with my chart message if any issues  Evangeline Dakin, DNP 08/13/2020, 9:13 AM [facility] [address], [place], [place] [address] [phone]

## 2020-08-12 NOTE — Telephone Encounter (Signed)
*  STAT* If patient is at the pharmacy, call can be transferred to refill team.   1. Which medications need to be refilled? (please list name of each medication and dose if known)  losartan (COZAAR) 100 MG tablet amLODipine (NORVASC) 10 MG tablet hydrochlorothiazide (MICROZIDE) 12.5 MG capsule  2. Which pharmacy/location (including street and city if local pharmacy) is medication to be sent to? [facility]  3. Do they need a 30 day or 90 day supply? 90 day supply

## 2020-08-13 ENCOUNTER — Other Ambulatory Visit (HOSPITAL_COMMUNITY): Payer: Self-pay

## 2020-08-13 ENCOUNTER — Encounter: Payer: Self-pay | Admitting: Neurology

## 2020-08-13 ENCOUNTER — Other Ambulatory Visit: Payer: Self-pay

## 2020-08-13 ENCOUNTER — Ambulatory Visit: Payer: 59 | Admitting: Neurology

## 2020-08-13 VITALS — BP 129/81 | HR 58 | Ht 64.0 in | Wt 210.5 lb

## 2020-08-13 DIAGNOSIS — G43109 Migraine with aura, not intractable, without status migrainosus: Secondary | ICD-10-CM | POA: Diagnosis not present

## 2020-08-13 MED ORDER — SUMATRIPTAN SUCCINATE 25 MG PO TABS
25.0000 mg | ORAL_TABLET | ORAL | 5 refills | Status: DC | PRN
  Filled 2020-08-13: qty 10, 30d supply, fill #0
  Filled 2020-10-06: qty 10, 30d supply, fill #1
  Filled 2020-11-11: qty 10, 30d supply, fill #2

## 2020-08-13 MED ORDER — TOPIRAMATE 25 MG PO TABS
75.0000 mg | ORAL_TABLET | Freq: Every day | ORAL | 3 refills | Status: DC
  Filled 2020-08-13: qty 270, 90d supply, fill #0
  Filled 2020-11-11: qty 270, 90d supply, fill #1
  Filled 2021-01-13 – 2021-02-12 (×2): qty 270, 90d supply, fill #2

## 2020-08-13 NOTE — Patient Instructions (Signed)
Continue the Topamax  Stop the Maxalt, try Imitrex 25 mg at onset of dizziness Follow-up in 8 months

## 2020-08-13 NOTE — Progress Notes (Signed)
I have read the note, and I agree with the clinical assessment and plan.  Jodi Bates Jodi Bates   

## 2020-08-28 ENCOUNTER — Telehealth: Payer: Self-pay | Admitting: Neurology

## 2020-08-28 NOTE — Telephone Encounter (Signed)
Mailed Pt health condition paperwork not picked up in May.

## 2020-08-30 ENCOUNTER — Other Ambulatory Visit (HOSPITAL_COMMUNITY): Payer: Self-pay

## 2020-08-30 MED FILL — Escitalopram Oxalate Tab 20 MG (Base Equiv): ORAL | 90 days supply | Qty: 90 | Fill #1 | Status: AC

## 2020-09-24 ENCOUNTER — Other Ambulatory Visit (HOSPITAL_COMMUNITY): Payer: Self-pay

## 2020-09-24 DIAGNOSIS — Z6835 Body mass index (BMI) 35.0-35.9, adult: Secondary | ICD-10-CM | POA: Diagnosis not present

## 2020-09-24 DIAGNOSIS — Z01419 Encounter for gynecological examination (general) (routine) without abnormal findings: Secondary | ICD-10-CM | POA: Diagnosis not present

## 2020-09-24 DIAGNOSIS — F419 Anxiety disorder, unspecified: Secondary | ICD-10-CM | POA: Diagnosis not present

## 2020-09-24 MED ORDER — ESCITALOPRAM OXALATE 20 MG PO TABS
20.0000 mg | ORAL_TABLET | Freq: Every day | ORAL | 4 refills | Status: DC
  Filled 2020-09-24 – 2020-12-09 (×3): qty 90, 90d supply, fill #0
  Filled 2021-03-18: qty 90, 90d supply, fill #1
  Filled 2021-06-16: qty 90, 90d supply, fill #2
  Filled 2021-09-19: qty 90, 90d supply, fill #3

## 2020-09-24 MED ORDER — ALPRAZOLAM 0.5 MG PO TABS
0.5000 mg | ORAL_TABLET | Freq: Two times a day (BID) | ORAL | 0 refills | Status: DC | PRN
  Filled 2020-09-24 – 2020-10-03 (×2): qty 60, 30d supply, fill #0

## 2020-10-02 ENCOUNTER — Other Ambulatory Visit (HOSPITAL_COMMUNITY): Payer: Self-pay

## 2020-10-03 ENCOUNTER — Other Ambulatory Visit (HOSPITAL_COMMUNITY): Payer: Self-pay

## 2020-10-07 ENCOUNTER — Other Ambulatory Visit (HOSPITAL_COMMUNITY): Payer: Self-pay

## 2020-10-11 ENCOUNTER — Other Ambulatory Visit: Payer: Self-pay

## 2020-10-11 ENCOUNTER — Encounter (HOSPITAL_COMMUNITY): Payer: Self-pay | Admitting: Emergency Medicine

## 2020-10-11 ENCOUNTER — Emergency Department (HOSPITAL_COMMUNITY): Payer: 59

## 2020-10-11 ENCOUNTER — Emergency Department (HOSPITAL_COMMUNITY)
Admission: EM | Admit: 2020-10-11 | Discharge: 2020-10-12 | Disposition: A | Discharge status: Home / Self Care | Payer: 59 | Attending: Emergency Medicine | Admitting: Emergency Medicine

## 2020-10-11 ENCOUNTER — Telehealth: Payer: Self-pay | Admitting: Neurology

## 2020-10-11 DIAGNOSIS — J45909 Unspecified asthma, uncomplicated: Secondary | ICD-10-CM | POA: Insufficient documentation

## 2020-10-11 DIAGNOSIS — Z79899 Other long term (current) drug therapy: Secondary | ICD-10-CM | POA: Insufficient documentation

## 2020-10-11 DIAGNOSIS — R109 Unspecified abdominal pain: Secondary | ICD-10-CM | POA: Insufficient documentation

## 2020-10-11 DIAGNOSIS — I10 Essential (primary) hypertension: Secondary | ICD-10-CM | POA: Diagnosis not present

## 2020-10-11 DIAGNOSIS — J9811 Atelectasis: Secondary | ICD-10-CM | POA: Diagnosis not present

## 2020-10-11 DIAGNOSIS — R072 Precordial pain: Secondary | ICD-10-CM | POA: Diagnosis not present

## 2020-10-11 DIAGNOSIS — R079 Chest pain, unspecified: Secondary | ICD-10-CM | POA: Diagnosis not present

## 2020-10-11 DIAGNOSIS — U071 COVID-19: Secondary | ICD-10-CM | POA: Insufficient documentation

## 2020-10-11 DIAGNOSIS — Z91040 Latex allergy status: Secondary | ICD-10-CM | POA: Diagnosis not present

## 2020-10-11 DIAGNOSIS — R0789 Other chest pain: Secondary | ICD-10-CM | POA: Diagnosis not present

## 2020-10-11 LAB — CBC WITH DIFFERENTIAL/PLATELET
Abs Immature Granulocytes: 0.01 10*3/uL (ref 0.00–0.07)
Basophils Absolute: 0 10*3/uL (ref 0.0–0.1)
Basophils Relative: 0 %
Eosinophils Absolute: 0.1 10*3/uL (ref 0.0–0.5)
Eosinophils Relative: 1 %
HCT: 40.2 % (ref 36.0–46.0)
Hemoglobin: 13.5 g/dL (ref 12.0–15.0)
Immature Granulocytes: 0 %
Lymphocytes Relative: 11 %
Lymphs Abs: 0.5 10*3/uL — ABNORMAL LOW (ref 0.7–4.0)
MCH: 30.2 pg (ref 26.0–34.0)
MCHC: 33.6 g/dL (ref 30.0–36.0)
MCV: 89.9 fL (ref 80.0–100.0)
Monocytes Absolute: 0.8 10*3/uL (ref 0.1–1.0)
Monocytes Relative: 19 %
Neutro Abs: 2.9 10*3/uL (ref 1.7–7.7)
Neutrophils Relative %: 69 %
Platelets: 171 10*3/uL (ref 150–400)
RBC: 4.47 MIL/uL (ref 3.87–5.11)
RDW: 12.5 % (ref 11.5–15.5)
WBC: 4.3 10*3/uL (ref 4.0–10.5)
nRBC: 0 % (ref 0.0–0.2)

## 2020-10-11 NOTE — Telephone Encounter (Signed)
Pt has had vertigo for a week, has been taking medication but no better, pt asking for a call to discuss.

## 2020-10-11 NOTE — ED Triage Notes (Signed)
Pt reports substernal chest pain that goes to her right shoulder and leg.  Pt reports pain started around noon.  Pt reports nausea but no Nausea.

## 2020-10-11 NOTE — ED Provider Notes (Signed)
Emergency Medicine Provider Triage Evaluation Note  Jodi Bates , a 55 y.o. female  was evaluated in triage.  Pt complains of chest pain since 11AM. Pain is central, sharp, radiates into the throat/jaw and around her sides at times. No alleviating/aggravating factors. No change with exertion. Having some associated nausea. No cardiac hx. Tried asthma medications and tums without relief.   Review of Systems  Positive: Chest pain, nausea Negative: Dyspnea, vomiting, syncope  Physical Exam  BP (!) 162/96   Pulse 94   Temp 99.4 F (37.4 C) (Oral)   Resp 16   SpO2 96%  Gen:   Awake, no distress   Resp:  Normal effort  MSK:   Moves extremities without difficulty  Other:  Epigastric TTP. No LE edema. Symmetric radial pulses.   Medical Decision Making  Medically screening exam initiated at 10:52 PM.  Appropriate orders placed.  Jodi Bates was informed that the remainder of the evaluation will be completed by another provider, this initial triage assessment does not replace that evaluation, and the importance of remaining in the ED until their evaluation is complete.  Chest pain.    Jodi Bates 10/11/20 2300    Jodi Fuse, MD 10/12/20 [phone]

## 2020-10-12 ENCOUNTER — Emergency Department (HOSPITAL_COMMUNITY): Payer: 59

## 2020-10-12 DIAGNOSIS — J45909 Unspecified asthma, uncomplicated: Secondary | ICD-10-CM | POA: Diagnosis not present

## 2020-10-12 DIAGNOSIS — Z79899 Other long term (current) drug therapy: Secondary | ICD-10-CM | POA: Diagnosis not present

## 2020-10-12 DIAGNOSIS — R109 Unspecified abdominal pain: Secondary | ICD-10-CM | POA: Diagnosis not present

## 2020-10-12 DIAGNOSIS — R072 Precordial pain: Secondary | ICD-10-CM | POA: Diagnosis not present

## 2020-10-12 DIAGNOSIS — R079 Chest pain, unspecified: Secondary | ICD-10-CM | POA: Diagnosis not present

## 2020-10-12 DIAGNOSIS — I10 Essential (primary) hypertension: Secondary | ICD-10-CM | POA: Diagnosis not present

## 2020-10-12 DIAGNOSIS — J9811 Atelectasis: Secondary | ICD-10-CM | POA: Diagnosis not present

## 2020-10-12 DIAGNOSIS — U071 COVID-19: Secondary | ICD-10-CM | POA: Diagnosis not present

## 2020-10-12 DIAGNOSIS — Z91040 Latex allergy status: Secondary | ICD-10-CM | POA: Diagnosis not present

## 2020-10-12 LAB — COMPREHENSIVE METABOLIC PANEL
ALT: 14 U/L (ref 0–44)
AST: 20 U/L (ref 15–41)
Albumin: 3.7 g/dL (ref 3.5–5.0)
Alkaline Phosphatase: 66 U/L (ref 38–126)
Anion gap: 10 (ref 5–15)
BUN: 19 mg/dL (ref 6–20)
CO2: 25 mmol/L (ref 22–32)
Calcium: 9.1 mg/dL (ref 8.9–10.3)
Chloride: 104 mmol/L (ref 98–111)
Creatinine, Ser: 0.86 mg/dL (ref 0.44–1.00)
GFR, Estimated: >60 mL/min (ref >60)
Glucose, Bld: 146 mg/dL — ABNORMAL HIGH (ref 70–99)
Potassium: 3 mmol/L — ABNORMAL LOW (ref 3.5–5.1)
Sodium: 139 mmol/L (ref 135–145)
Total Bilirubin: 0.6 mg/dL (ref 0.3–1.2)
Total Protein: 6.2 g/dL — ABNORMAL LOW (ref 6.5–8.1)

## 2020-10-12 LAB — TROPONIN I (HIGH SENSITIVITY)
Troponin I (High Sensitivity): 3 ng/L (ref <18)
Troponin I (High Sensitivity): 3 ng/L (ref <18)

## 2020-10-12 LAB — LIPASE, BLOOD: Lipase: 32 U/L (ref 11–51)

## 2020-10-12 LAB — D-DIMER, QUANTITATIVE: D-Dimer, Quant: 0.56 ug/mL-FEU — ABNORMAL HIGH (ref 0.00–0.50)

## 2020-10-12 LAB — SARS CORONAVIRUS 2 (TAT 6-24 HRS): SARS Coronavirus 2: POSITIVE — AB

## 2020-10-12 MED ORDER — IOHEXOL 350 MG/ML SOLN
75.0000 mL | Freq: Once | INTRAVENOUS | Status: AC | PRN
  Administered 2020-10-12: 75 mL via INTRAVENOUS

## 2020-10-12 MED ORDER — POTASSIUM CHLORIDE CRYS ER 20 MEQ PO TBCR
40.0000 meq | EXTENDED_RELEASE_TABLET | Freq: Once | ORAL | Status: AC
  Administered 2020-10-12: 40 meq via ORAL
  Filled 2020-10-12: qty 2

## 2020-10-12 NOTE — ED Provider Notes (Signed)
  Physical Exam  BP 132/88   Pulse 77   Temp 99 F (37.2 C)   Resp 20   SpO2 98%   Physical Exam  ED Course/Procedures     Procedures  MDM  Hand off from Dr. Ralene Bathe Chest pain and cough EKG and trop negative. D-dimer pending. Risk factors-BMI HTN No family history No infectious sxs except cough Covid ordered now May d/c if d-dimer negative. 8:54 AM D-dimer positive and Dr. Ralene Bathe has added CT angiogram.  Will recheck patient after CT angiogram performed DG Chest 2 View  Result Date: 10/11/2020 CLINICAL DATA:  Chest pain EXAM: CHEST - 2 VIEW COMPARISON:  11/10/2016 FINDINGS: The heart size and mediastinal contours are within normal limits. Aortic atherosclerosis. Both lungs are clear. The visualized skeletal structures are unremarkable. IMPRESSION: No active cardiopulmonary disease. Electronically Signed   By: Donavan Foil M.D.   On: 10/11/2020 23:24   CT Angio Chest PE W/Cm &/Or Wo Cm  Result Date: 10/12/2020 CLINICAL DATA:  Substernal chest pain, concern for pulmonary embolism. EXAM: CT ANGIOGRAPHY CHEST WITH CONTRAST TECHNIQUE: Multidetector CT imaging of the chest was performed using the standard protocol during bolus administration of intravenous contrast. Multiplanar CT image reconstructions and MIPs were obtained to evaluate the vascular anatomy. CONTRAST:  75m OMNIPAQUE IOHEXOL 350 MG/ML SOLN COMPARISON:  Chest radiograph dated 10/11/2020 and CT chest dated 12/12/2014. FINDINGS: Cardiovascular: Satisfactory opacification of the pulmonary arteries to the segmental level. No evidence of pulmonary embolism. Vascular calcifications are seen in the aortic arch. Normal heart size. No pericardial effusion. Mediastinum/Nodes: No enlarged mediastinal, hilar, or axillary lymph nodes. Thyroid gland, trachea, and esophagus demonstrate no significant findings. Lungs/Pleura: There is mild bilateral dependent atelectasis. No focal consolidation, pleural effusion, or pneumothorax. Upper  Abdomen: No acute abnormality. Musculoskeletal: No chest wall abnormality. No acute or significant osseous findings. Review of the MIP images confirms the above findings. IMPRESSION: No acute pulmonary embolism.  Mild bilateral dependent atelectasis. Aortic Atherosclerosis (ICD10-I70.0). Electronically Signed   By: TZerita BoersM.D.   On: 10/12/2020 09:44     Patient seen and evaluated for chest pain.  Differential diagnosis of serious/life threatening causes of chest pain includes ACS, other diseases of the heart such as myocarditis or pericarditis, lung etiologies such as infection or pneumothorax, diseases of the great vessels such as aortic dissection or AAA, pulmonary embolism, or GI sources such as cholecystitis or other upper abdominal causes. Doubt ACS- heart score documented, EKG reviewed, Given the timing of pain to ER presentation,  delta troponin normal and unchanged was 3 so doubt NSTEMI troponin and repeat troponin obtained and WNL Doubt myocarditis/pericarditis/tamponade based on history, review of ekg and labs Doubt aortic dissection based on history and review of imaging Doubt intrinsic lung causes such as pneumonia or pneumothorax, based on history, physical exam, and studies obtained. Doubt PE based on history, physical exam, and PERC Doubt acute GI etiology requiring intervention based on history, physical exam and labs. Patient appears stable for discharge. Return precautions and need for follow up discussed and patient voices understanding     RPattricia Boss MD 10/12/20 1000

## 2020-10-12 NOTE — ED Notes (Signed)
Patient transported to CT 

## 2020-10-12 NOTE — ED Provider Notes (Signed)
[facility] EMERGENCY DEPARTMENT Provider Note   CSN: [id number] Arrival date & time: 10/11/20  2231     History Chief Complaint  Patient presents with   Chest Pain    Meleena Herlong is a 55 y.o. female.  The history is provided by the patient and medical records.  Chest Pain Khamiyah Huckleby is a 55 y.o. female who presents to the Emergency Department complaining of chest pain.  She presents to the ED complaining of sharp, central chest pain that radiates to her right shoulder and back.  She tried her asthma medication and pepto with no relief.  Pain is constant in nature, worsening over time.  No fever, sob.  Has nonproductive cough - started today.  Has been experiencing vertigo since Sunday.  No prior abdominal surgeries, has some central abdominal discomfort.  Has nausea.  No diaphoresis. No sore throat, nasal congestion. She is vaccinated and boosted for COVID-19. No known sick contacts.  No hormone use.  No recent surgery.  No hx/o DVT/PE.  No hx/o CAD.  No tobacco.      Past Medical History:  Diagnosis Date   Allergy    Anxiety    Asthma    prn inhalers   GERD (gastroesophageal reflux disease)    Gluteal cleft wound 03/2012   recurrent gluteal cleft infections   Headache(784.0)    tension   Hypertension    under control with meds., has been on med. > 5 yr.   Iron deficiency anemia    no current problems or meds.   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/23/2012   Infected pilonidal cyst 01/26/2012   Fatigue 12/29/2011   Vertigo 12/29/2011   Iron deficiency anemia 07/27/2011    ESSENTIAL HYPERTENSION, BENIGN 03/22/2009   ASTHMA, UNSPECIFIED, UNSPECIFIED STATUS 03/22/2009   PALPITATIONS 03/22/2009    Past Surgical History:  Procedure Laterality Date   BREAST CYST EXCISION     BREAST EXCISIONAL BIOPSY Left    BREAST SURGERY      lt breast/ benign cyst   CYST EXCISION     back of head   HYSTEROSCOPY WITH D & C  06/25/2004   INCISION AND DRAINAGE PERIRECTAL ABSCESS N/A 02/10/2013   Procedure: IRRIGATION AND DEBRIDEMENT PERIRECTAL ABSCESS;  Surgeon: Gwenyth Ober, MD;  Location: [place];  Service: General;  Laterality: N/A;   LAPAROSCOPIC ENDOMETRIOSIS FULGURATION     PILONIDAL CYST EXCISION N/A 04/28/2012   Procedure: exam under anesthesia and exicision of pilonidal;  Surgeon: Gwenyth Ober, MD;  Location: [place];  Service: General;  Laterality: N/A;   POLYPECTOMY     uterine   TUBAL LIGATION       OB History   No obstetric history on file.     Family History  Problem Relation Age of Onset   Diabetes Mother    Hypertension Mother    Allergic rhinitis Mother    Heart failure Mother    Cancer Father        deceased metastaic throat cancer   Esophageal cancer Father    Diabetes Brother    Pancreatic cancer Brother  Asthma Brother    Sleep apnea Brother    Cancer Maternal Grandmother        breast   Breast cancer Maternal Grandmother    Allergic rhinitis Son    Asthma Son    Colon cancer Neg Hx    Rectal cancer Neg Hx    Stomach cancer Neg Hx    Colon polyps Neg Hx     Social History   Tobacco Use   Smoking status: Never   Smokeless tobacco: Never  Vaping Use   Vaping Use: Never used  Substance Use Topics   Alcohol use: No   Drug use: No    Home Medications Prior to Admission medications   Medication Sig Start Date End Date Taking? Authorizing Provider  albuterol (PROVENTIL) (2.5 MG/3ML) 0.083% nebulizer solution Take 3 mLs by nebulization every 6 (six) hours as needed for wheezing or shortness of breath.    Yes  [provider]  albuterol (VENTOLIN HFA) 108 (90 Base) MCG/ACT inhaler INHALE 2 PUFFS INTO THE LUNGS EVERY 4 HOURS AS NEEDED FOR WHEEZING OR SHORTNESSS OF BREATH Patient taking differently: Inhale 2 puffs into the lungs every 4 (four) hours as needed for shortness of breath or wheezing. 02/03/20 02/02/21 Yes Wieters, Hallie C, PA-C  ALPRAZolam (XANAX) 0.5 MG tablet Take 1 tablet (0.5 mg total) by mouth 3 (three) times daily. Patient taking differently: Take 0.5 mg by mouth 3 (three) times daily as needed for anxiety. 05/03/20  Yes Baxley, Cresenciano Lick, MD  amLODipine (NORVASC) 10 MG tablet Take 1 tablet (10 mg total) by mouth daily. 08/12/20  Yes [name], MD  Blood Pressure Monitoring (OMRON 3 SERIES BP MONITOR) DEVI USE AS DIRECTED 03/15/20 03/15/21 Yes   escitalopram (LEXAPRO) 20 MG tablet Take 1 tablet (20 mg total) by mouth daily. Patient taking differently: Take 20 mg by mouth at bedtime. 09/24/20  Yes   fluticasone furoate-vilanterol (BREO ELLIPTA) 100-25 MCG/INH AEPB INHALE 1 PUFF BY MOUTH DAILY. Patient taking differently: Inhale 1 puff into the lungs daily as needed (wheezing/shortness of breath). 04/23/20 04/23/21 Yes Kozlow, Donnamarie Poag, MD  Ginger, Zingiber officinalis, (GINGER PO) Take 1 tablet by mouth in the morning and at bedtime. chewable   Yes [provider]  hydrochlorothiazide (MICROZIDE) 12.5 MG capsule TAKE 1 CAPSULE (12.5 MG TOTAL) BY MOUTH DAILY. PLEASE MAKE APPOINTMENT FOR FURTHER REFILLS, THANKS! 08/12/20  Yes [name], MD  loratadine (CLARITIN) 10 MG tablet TAKE 1 TABLET (10 MG TOTAL) BY MOUTH DAILY. Patient taking differently: Take 10 mg by mouth daily as needed for allergies. 04/23/20 04/23/21 Yes Kozlow, Donnamarie Poag, MD  losartan (COZAAR) 100 MG tablet TAKE 1 TABLET (100 MG TOTAL) BY MOUTH DAILY. 08/12/20  Yes [name], MD  SUMAtriptan (IMITREX) 25 MG tablet Take 1 tablet (25 mg total) by mouth every 2 (two) hours as needed for migraine. May repeat in 2  hours if headache persists or recurs. 08/13/20  Yes Suzzanne Cloud, NP  topiramate (TOPAMAX) 25 MG tablet Take 3 tablets (75 mg total) by mouth at bedtime. 08/13/20  Yes Suzzanne Cloud, NP  benzonatate (TESSALON) 200 MG capsule TAKE 1 CAPSULE BY MOUTH 3 TIMES A DAY FOR UP TO 7 DAYS AS NEEDED FOR COUGH Patient not taking: No sig reported 02/03/20 02/02/21  Wieters, Hallie C, PA-C  predniSONE (DELTASONE) 20 MG tablet TAKE 2 TABLETS BY MOUTH DAILY WITH BREAKFAST FOR 5 DAYS Patient not taking: No sig reported 02/03/20 02/02/21  Wieters, Elesa Hacker, PA-C  fluticasone (FLONASE) 50 MCG/ACT nasal spray Place 1-2 sprays into both nostrils daily. 02/03/20 03/29/20  Wieters, Hallie C, PA-C  pantoprazole (PROTONIX) 40 MG tablet Take 1 tablet (40 mg total) by mouth daily. 12/14/19 04/09/20  Elby Showers, MD    Allergies    Adhesive [tape], Latex, Soap, and Other  Review of Systems   Review of Systems  Cardiovascular:  Positive for chest pain.  All other systems reviewed and are negative.  Physical Exam Updated Vital Signs BP 127/86   Pulse 76   Temp 99 F (37.2 C)   Resp 18   SpO2 99%   Physical Exam Vitals and nursing note reviewed.  Constitutional:      Appearance: She is well-developed.  HENT:     Head: Normocephalic and atraumatic.  Cardiovascular:     Rate and Rhythm: Normal rate and regular rhythm.     Heart sounds: No murmur heard. Pulmonary:     Effort: Pulmonary effort is normal. No respiratory distress.     Breath sounds: Normal breath sounds.  Abdominal:     Palpations: Abdomen is soft.     Tenderness: There is no abdominal tenderness. There is no guarding or rebound.  Musculoskeletal:        General: No swelling or tenderness.  Skin:    General: Skin is warm and dry.  Neurological:     Mental Status: She is alert and oriented to person, place, and time.  Psychiatric:        Behavior: Behavior normal.    ED Results / Procedures / Treatments   Labs (all labs ordered are listed,  but only abnormal results are displayed) Labs Reviewed  COMPREHENSIVE METABOLIC PANEL - Abnormal; Notable for the following components:      Result Value   Potassium 3.0 (*)    Glucose, Bld 146 (*)    Total Protein 6.2 (*)    All other components within normal limits  CBC WITH DIFFERENTIAL/PLATELET - Abnormal; Notable for the following components:   Lymphs Abs 0.5 (*)    All other components within normal limits  D-DIMER, QUANTITATIVE - Abnormal; Notable for the following components:   D-Dimer, Quant 0.56 (*)    All other components within normal limits  SARS CORONAVIRUS 2 (TAT 6-24 HRS)  LIPASE, BLOOD  TROPONIN I (HIGH SENSITIVITY)  TROPONIN I (HIGH SENSITIVITY)    EKG EKG Interpretation  Date/Time:  Friday October 11 2020 22:34:26 EDT Ventricular Rate:  97 PR Interval:  168 QRS Duration: 78 QT Interval:  366 QTC Calculation: 464 R Axis:   -11 Text Interpretation: Normal sinus rhythm Low voltage QRS Septal infarct , age undetermined Abnormal ECG Confirmed by Quintella Reichert [phone]) on 10/12/2020 5:50:08 AM  Radiology DG Chest 2 View  Result Date: 10/11/2020 CLINICAL DATA:  Chest pain EXAM: CHEST - 2 VIEW COMPARISON:  11/10/2016 FINDINGS: The heart size and mediastinal contours are within normal limits. Aortic atherosclerosis. Both lungs are clear. The visualized skeletal structures are unremarkable. IMPRESSION: No active cardiopulmonary disease. Electronically Signed   By: Donavan Foil M.D.   On: 10/11/2020 23:24    Procedures Procedures   Medications Ordered in ED Medications  potassium chloride SA (KLOR-CON) CR tablet 40 mEq (has no administration in time range)    ED Course  I have reviewed the triage vital signs and the nursing notes.  Pertinent labs & imaging results that were available during my care of the patient were reviewed by me and considered in my medical  decision making (see chart for details).    MDM Rules/Calculators/A&P                           patient here for evaluation of chest pain, cough. She is non-toxic appearing on evaluation. EKG is without acute ischemic changes in troponin's are negative times two. Given her associated respiratory symptoms, unclear source of pain will check a D dimer. Patient care transferred pending D dimer and reassessment.  Final Clinical Impression(s) / ED Diagnoses Final diagnoses:  None    Rx / DC Orders ED Discharge Orders     None        Quintella Reichert, MD 10/12/20 [phone]

## 2020-10-14 ENCOUNTER — Telehealth: Payer: Self-pay | Admitting: Allergy and Immunology

## 2020-10-14 ENCOUNTER — Other Ambulatory Visit (HOSPITAL_COMMUNITY): Payer: Self-pay

## 2020-10-14 MED ORDER — ALBUTEROL SULFATE (2.5 MG/3ML) 0.083% IN NEBU
3.0000 mL | INHALATION_SOLUTION | Freq: Four times a day (QID) | RESPIRATORY_TRACT | 1 refills | Status: DC | PRN
  Filled 2020-10-14: qty 75, 7d supply, fill #0

## 2020-10-14 MED ORDER — DEXAMETHASONE 2 MG PO TABS
ORAL_TABLET | ORAL | 0 refills | Status: AC
  Filled 2020-10-14: qty 6, 3d supply, fill #0

## 2020-10-14 NOTE — Addendum Note (Signed)
Addended by: Kathrynn Ducking on: 10/14/2020 08:27 AM   Modules accepted: Orders

## 2020-10-14 NOTE — Telephone Encounter (Signed)
Please let her know that we have received her message about COVID.  Is she being treated with antiviral agents?

## 2020-10-14 NOTE — Telephone Encounter (Signed)
Spoke with patient, informed her that I have sent in her albuterol nebulizer medication and will send Dr. Neldon Mc a message informing him that she has Covid.

## 2020-10-14 NOTE — Telephone Encounter (Signed)
I called the patient.  The patient has had symptoms of headache and vertigo for about a week.  Went to the emergency room with some chest pain issues on 16 September, found to have COVID infection.  She is having some chills, no definite fevers, she is having sweats, cough, shortness of breath.  She is having some nausea with the vertigo but this is some better.  I will give her a 3-day course of Decadron at this time.

## 2020-10-14 NOTE — Telephone Encounter (Signed)
Patient is requesting a refill for her nebulizer solution. [facility]. She also wants to let Dr. Neldon Mc know she has Covid.

## 2020-10-15 NOTE — Telephone Encounter (Signed)
Pt is not taking any antiviral medication

## 2020-10-15 NOTE — Telephone Encounter (Signed)
She is feeling some what better, the aches are easing up and the chills are getting better. Her breathing is not to bad. She coughs only when taking a deep breath. She is just trying to take it easy.

## 2020-10-15 NOTE — Telephone Encounter (Signed)
If she is ill with a lot of lower respiratory tract symptoms then we can start her on Paxlovid if it has been 5 days since onset of symptoms.  If she is not particularly sick with her lower respiratory tract then we can just wait to see what happens if she moves forward.

## 2020-10-31 ENCOUNTER — Other Ambulatory Visit (HOSPITAL_COMMUNITY): Payer: Self-pay

## 2020-10-31 ENCOUNTER — Telehealth: Payer: Self-pay | Admitting: Neurology

## 2020-10-31 DIAGNOSIS — R42 Dizziness and giddiness: Secondary | ICD-10-CM

## 2020-10-31 MED ORDER — DEXAMETHASONE 2 MG PO TABS
ORAL_TABLET | ORAL | 0 refills | Status: DC
  Filled 2020-10-31: qty 6, 3d supply, fill #0

## 2020-10-31 NOTE — Telephone Encounter (Signed)
I called the patient.  She was having a bout of vertigo that started today.  She does have a mild headache with this.  She is concerned that there may be a sinus issue, she was reporting sinus pain and ear pain.  She would like an ENT evaluation for this, I will try to get this set up.  She is only on 75 mg of Topamax, she is not sure that she can take a higher dose.  We gave her Decadron on 11 October 2020 for the vertigo, she cannot remember where this helped or not.  She is missing work because of the vertigo.  She does have a mild headache today.  I will send in another prescription for the Decadron, we may consider addition of another medication such as Depakote for migraine.  The patient had a heart rate of 58 when last seen, cannot use propranolol.

## 2020-10-31 NOTE — Telephone Encounter (Signed)
Pt called wanting to know if she can get a ENT referral due to her Vertigo. Pt is wanting a inner Ear workup. Please advise.

## 2020-10-31 NOTE — Addendum Note (Signed)
Addended by: Kathrynn Ducking on: 10/31/2020 04:27 PM   Modules accepted: Orders

## 2020-11-05 ENCOUNTER — Telehealth: Payer: Self-pay | Admitting: Neurology

## 2020-11-05 NOTE — Telephone Encounter (Signed)
Sent Dr. Janace Hoard or associates  ph # [phone].

## 2020-11-08 ENCOUNTER — Other Ambulatory Visit: Payer: Self-pay

## 2020-11-08 ENCOUNTER — Ambulatory Visit (HOSPITAL_BASED_OUTPATIENT_CLINIC_OR_DEPARTMENT_OTHER): Payer: 59 | Admitting: Cardiovascular Disease

## 2020-11-08 VITALS — BP 122/80 | HR 75 | Ht 64.0 in | Wt 217.3 lb

## 2020-11-08 DIAGNOSIS — I10 Essential (primary) hypertension: Secondary | ICD-10-CM | POA: Diagnosis not present

## 2020-11-08 DIAGNOSIS — R42 Dizziness and giddiness: Secondary | ICD-10-CM

## 2020-11-08 NOTE — Progress Notes (Signed)
Cardiology Office Note   Date:  11/08/2020   ID:  Aahana, Elza Sep 20, 1965, MRN [id number]  PCP:  Elby Showers, MD  Cardiologist:   [name], MD   No chief complaint on file.     History of Present Illness: Jodi Bates is a 55 y.o. female with asthma and hypertension who is being seen today for follow up.  She was seen 05/2016 for bradycardia and near syncope as well as hypertension.  Ms. Eifert was seen in the emergency department 01/2016 for episode of near-syncope. 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 She was seen in the emergency department and her heart rate was in the 40s. At the time she was taking nebivolol. This was discontinued and she has not had any recurrent episodes.  She has struggled with poorly-controlled hypertension, so furosemide was switched to HCTZ.  At her last appointment her blood pressure was above goal so amlodipine was increased to 10 mg daily.  Ms. Nachtigal was seen in the ED 09/2020 for chest pain. Cardiac enzymes were negative.  She was diagnosed with COVID-19.  She has been feeling better and has no more chest pain.  She still has a cough and some body aches.  She has a longstanding history of vertigo.  She saw a neurologist and they thought it was due to migraines with aura.  They prescribed sumatriptan but it makes her sleepy.  She noes that's she has allergies and congestion.  She doesn't exercise as much because she is afraid of a vertigo attack.  She has no exertional chest pain or shortness of breath.  She was able to lose 40 lb by dieting but gained some back.    Past Medical History:  Diagnosis Date   Allergy    Anxiety    Asthma    prn inhalers   GERD (gastroesophageal reflux disease)    Gluteal cleft wound 03/2012   recurrent gluteal cleft infections   Headache(784.0)    tension   Hypertension    under control with meds., has been on med.  > 5 yr.   Iron deficiency anemia    no current problems or meds.   Migraine headache with aura 04/09/2020    Past Surgical History:  Procedure Laterality Date   BREAST CYST EXCISION     BREAST EXCISIONAL BIOPSY Left    BREAST SURGERY      lt breast/ benign cyst   CYST EXCISION     back of head   HYSTEROSCOPY WITH D & C  06/25/2004   INCISION AND DRAINAGE PERIRECTAL ABSCESS N/A 02/10/2013   Procedure: IRRIGATION AND DEBRIDEMENT PERIRECTAL ABSCESS;  Surgeon: Gwenyth Ober, MD;  Location: [facility];  Service: General;  Laterality: N/A;   LAPAROSCOPIC ENDOMETRIOSIS FULGURATION     PILONIDAL CYST EXCISION N/A 04/28/2012   Procedure: exam under anesthesia and exicision of pilonidal;  Surgeon: Gwenyth Ober, MD;  Location: [facility];  Service: General;  Laterality: N/A;   POLYPECTOMY     uterine   TUBAL LIGATION       Current Outpatient Medications  Medication Sig Dispense Refill   albuterol (PROVENTIL) (2.5 MG/3ML) 0.083% nebulizer solution Take 3 mLs by nebulization every 6 (six) hours as needed for wheezing or shortness of breath. 75 mL 1   albuterol (VENTOLIN HFA) 108 (90 Base) MCG/ACT inhaler INHALE 2 PUFFS INTO THE LUNGS EVERY 4 HOURS  AS NEEDED FOR WHEEZING OR SHORTNESSS OF BREATH (Patient taking differently: Inhale 2 puffs into the lungs every 4 (four) hours as needed for shortness of breath or wheezing.) 8.5 g 1   ALPRAZolam (XANAX) 0.5 MG tablet Take 1 tablet (0.5 mg total) by mouth 3 (three) times daily. (Patient taking differently: Take 0.5 mg by mouth 3 (three) times daily as needed for anxiety.) 90 tablet 1   amLODipine (NORVASC) 10 MG tablet Take 1 tablet (10 mg total) by mouth daily. 90 tablet 3   Blood Pressure Monitoring (OMRON 3 SERIES BP MONITOR) DEVI USE AS DIRECTED 1 each 0   escitalopram (LEXAPRO) 20 MG tablet Take 1 tablet (20 mg total) by mouth daily. (Patient taking differently: Take 20 mg by mouth at bedtime.) 90 tablet 4   fluticasone furoate-vilanterol  (BREO ELLIPTA) 100-25 MCG/INH AEPB INHALE 1 PUFF BY MOUTH DAILY. (Patient taking differently: Inhale 1 puff into the lungs daily as needed (wheezing/shortness of breath).) 60 each 5   Ginger, Zingiber officinalis, (GINGER PO) Take 1 tablet by mouth in the morning and at bedtime. chewable     hydrochlorothiazide (MICROZIDE) 12.5 MG capsule TAKE 1 CAPSULE (12.5 MG TOTAL) BY MOUTH DAILY. PLEASE MAKE APPOINTMENT FOR FURTHER REFILLS, THANKS! 90 capsule 3   loratadine (CLARITIN) 10 MG tablet TAKE 1 TABLET (10 MG TOTAL) BY MOUTH DAILY. (Patient taking differently: Take 10 mg by mouth daily as needed for allergies.) 30 tablet 5   losartan (COZAAR) 100 MG tablet TAKE 1 TABLET (100 MG TOTAL) BY MOUTH DAILY. 90 tablet 0   SUMAtriptan (IMITREX) 25 MG tablet Take 1 tablet (25 mg total) by mouth every 2 (two) hours as needed for migraine. May repeat in 2 hours if headache persists or recurs. 10 tablet 5   topiramate (TOPAMAX) 25 MG tablet Take 3 tablets (75 mg total) by mouth at bedtime. 270 tablet 3   No current facility-administered medications for this visit.    Allergies:   Adhesive [tape], Latex, Soap, and Other    Social History:  The patient  reports that she has never smoked. She has never used smokeless tobacco. She reports that she does not drink alcohol and does not use drugs.   Family History:  The patient's family history includes Allergic rhinitis in her mother and son; Asthma in her brother and son; Breast cancer in her maternal grandmother; Cancer in her father and maternal grandmother; Diabetes in her brother and mother; Esophageal cancer in her father; Heart failure in her mother; Hypertension in her mother; Pancreatic cancer in her brother; Sleep apnea in her brother.    ROS:  Please see the history of present illness.   Otherwise, review of systems are positive for headaches.   All other systems are reviewed and negative.    PHYSICAL EXAM: VS:  BP 132/86 (BP Location: Right Arm, Patient  Position: Sitting, Cuff Size: Large)   Pulse 75   Ht 5\' 4"  (1.626 m)   Wt 217 lb 4.8 oz (98.6 kg)   SpO2 97%   BMI 37.30 kg/m  , BMI Body mass index is 37.3 kg/m. GENERAL:  Well appearing HEENT: Pupils equal round and reactive, fundi not visualized, oral mucosa unremarkable NECK:  No jugular venous distention, waveform within normal limits, carotid upstroke brisk and symmetric, no bruits, no thyromegaly LUNGS:  Clear to auscultation bilaterally HEART:  RRR.  PMI not displaced or sustained,S1 and S2 within normal limits, no S3, no S4, no clicks, no rubs, no murmurs ABD:  Flat, positive bowel sounds normal in frequency in pitch, no bruits, no rebound, no guarding, no midline pulsatile mass, no hepatomegaly, no splenomegaly EXT:  2 plus pulses throughout, no edema, no cyanosis no clubbing SKIN:  No rashes no nodules NEURO:  Cranial nerves II through XII grossly intact, motor grossly intact throughout PSYCH:  Cognitively intact, oriented to person place and time   EKG:  EKG is not ordered today. The ekg ordered 06/04/16 demonstrates sinus arrhythmia.  Rate 59 bpm.  Low voltage limb leads.  Cannot rule out prior septal infarct.    Recent Labs: 10/11/2020: ALT 14; BUN 19; Creatinine, Ser 0.86; Hemoglobin 13.5; Platelets 171; Potassium 3.0; Sodium 139    Lipid Panel    Component Value Date/Time   CHOL 184 03/25/2015 0905   TRIG 81 03/25/2015 0905   HDL 58 03/25/2015 0905   CHOLHDL 3.2 03/25/2015 0905   VLDL 16 03/25/2015 0905   LDLCALC 110 03/25/2015 0905      Wt Readings from Last 3 Encounters:  11/08/20 217 lb 4.8 oz (98.6 kg)  10/12/20 207 lb (93.9 kg)  08/13/20 210 lb 8 oz (95.5 kg)      ASSESSMENT AND PLAN:  # Hypertension: Blood pressure was initially elevated but better on repeat.  It has been stable at home.  Continue amlodipine, losartan and HCTZ.  # Near syncope: # Bradycardia: Stable.  No recurrent episodes since stopping nebivolol.  Avoid beta blockers.   #  Vertigo:  She will start taking a daily allergy medication and Flonase.  She will see an ENT next month.  # Obesity: Continue working on diet and exercise.  Check fating lipids/CMP.   Current medicines are reviewed at length with the patient today.  The patient does not have concerns regarding medicines.  The following changes have been made:  None  Labs/ tests ordered today include:  No orders of the defined types were placed in this encounter.    Disposition:   FU with Linzie Criss C. Oval Linsey, MD, [facility] as needed.    This note was written with the assistance of speech recognition software.  Please excuse any transcriptional errors.  Signed, Tonni Mansour C. Oval Linsey, MD, [facility]  11/08/2020 5:08 PM    [facility]

## 2020-11-08 NOTE — Patient Instructions (Addendum)
Medication Instructions:  Your physician recommends that you continue on your current medications as directed. Please refer to the Current Medication list given to you today.   *If you need a refill on your cardiac medications before your next appointment, please call your pharmacy*  Lab Work: FASTING LP/CMET SOON   If you have labs (blood work) drawn today and your tests are completely normal, you will receive your results only by: [place] (if you have MyChart) OR A paper copy in the mail If you have any lab test that is abnormal or we need to change your treatment, we will call you to review the results.  Testing/Procedures: NONE   Follow-Up: At [facility], you and your health needs are our priority.  As part of our continuing mission to provide you with exceptional heart care, we have created designated Provider Care Teams.  These Care Teams include your primary Cardiologist (physician) and Advanced Practice Providers (APPs -  Physician Assistants and Nurse Practitioners) who all work together to provide you with the care you need, when you need it.  We recommend signing up for the patient portal called "MyChart".  Sign up information is provided on this After Visit Summary.  MyChart is used to connect with patients for Virtual Visits (Telemedicine).  Patients are able to view lab/test results, encounter notes, upcoming appointments, etc.  Non-urgent messages can be sent to your provider as well.   To learn more about what you can do with MyChart, go to [email or web address].    Your next appointment:   02/14/2021 3:20 PM WITH DR [facility]   Other Instructions  MONITOR YOUR BLOOD PRESSURE AND LOG BRING YOUR READINGS AND MACHINE TO FOLLOW UP

## 2020-11-10 ENCOUNTER — Encounter (HOSPITAL_BASED_OUTPATIENT_CLINIC_OR_DEPARTMENT_OTHER): Payer: Self-pay | Admitting: Cardiovascular Disease

## 2020-11-10 HISTORY — DX: Morbid (severe) obesity due to excess calories: E66.01

## 2020-11-11 ENCOUNTER — Other Ambulatory Visit: Payer: 59 | Admitting: *Deleted

## 2020-11-11 ENCOUNTER — Other Ambulatory Visit: Payer: Self-pay

## 2020-11-11 ENCOUNTER — Other Ambulatory Visit (HOSPITAL_COMMUNITY): Payer: Self-pay

## 2020-11-11 DIAGNOSIS — I10 Essential (primary) hypertension: Secondary | ICD-10-CM | POA: Diagnosis not present

## 2020-11-11 LAB — COMPREHENSIVE METABOLIC PANEL
ALT: 13 IU/L (ref 0–32)
AST: 16 IU/L (ref 0–40)
Albumin/Globulin Ratio: 2.3 — ABNORMAL HIGH (ref 1.2–2.2)
Albumin: 4.4 g/dL (ref 3.8–4.9)
Alkaline Phosphatase: 79 IU/L (ref 44–121)
BUN/Creatinine Ratio: 19 (ref 9–23)
BUN: 15 mg/dL (ref 6–24)
Bilirubin Total: 0.4 mg/dL (ref 0.0–1.2)
CO2: 24 mmol/L (ref 20–29)
Calcium: 9.1 mg/dL (ref 8.7–10.2)
Chloride: 106 mmol/L (ref 96–106)
Creatinine, Ser: 0.8 mg/dL (ref 0.57–1.00)
Globulin, Total: 1.9 g/dL (ref 1.5–4.5)
Glucose: 114 mg/dL — ABNORMAL HIGH (ref 70–99)
Potassium: 3.5 mmol/L (ref 3.5–5.2)
Sodium: 142 mmol/L (ref 134–144)
Total Protein: 6.3 g/dL (ref 6.0–8.5)
eGFR: 88 mL/min/{1.73_m2} (ref >59)

## 2020-11-11 LAB — LIPID PANEL
Chol/HDL Ratio: 3.1 ratio (ref 0.0–4.4)
Cholesterol, Total: 185 mg/dL (ref 100–199)
HDL: 59 mg/dL (ref >39)
LDL Chol Calc (NIH): 110 mg/dL — ABNORMAL HIGH (ref 0–99)
Triglycerides: 85 mg/dL (ref 0–149)
VLDL Cholesterol Cal: 16 mg/dL (ref 5–40)

## 2020-11-20 ENCOUNTER — Other Ambulatory Visit (HOSPITAL_COMMUNITY): Payer: Self-pay

## 2020-12-01 ENCOUNTER — Other Ambulatory Visit: Payer: Self-pay | Admitting: Cardiovascular Disease

## 2020-12-02 ENCOUNTER — Other Ambulatory Visit (HOSPITAL_COMMUNITY): Payer: Self-pay

## 2020-12-02 MED ORDER — LOSARTAN POTASSIUM 100 MG PO TABS
100.0000 mg | ORAL_TABLET | Freq: Every day | ORAL | 1 refills | Status: DC
  Filled 2020-12-02: qty 90, 90d supply, fill #0
  Filled 2021-03-02: qty 90, 90d supply, fill #1

## 2020-12-04 DIAGNOSIS — G43009 Migraine without aura, not intractable, without status migrainosus: Secondary | ICD-10-CM | POA: Diagnosis not present

## 2020-12-04 DIAGNOSIS — R42 Dizziness and giddiness: Secondary | ICD-10-CM | POA: Diagnosis not present

## 2020-12-10 ENCOUNTER — Other Ambulatory Visit (HOSPITAL_COMMUNITY): Payer: Self-pay

## 2020-12-10 DIAGNOSIS — H40013 Open angle with borderline findings, low risk, bilateral: Secondary | ICD-10-CM | POA: Diagnosis not present

## 2020-12-10 DIAGNOSIS — R42 Dizziness and giddiness: Secondary | ICD-10-CM | POA: Diagnosis not present

## 2020-12-10 DIAGNOSIS — H04123 Dry eye syndrome of bilateral lacrimal glands: Secondary | ICD-10-CM | POA: Diagnosis not present

## 2020-12-10 MED ORDER — LATANOPROST 0.005 % OP SOLN
1.0000 [drp] | Freq: Every day | OPHTHALMIC | 11 refills | Status: DC
  Filled 2020-12-10 – 2020-12-11 (×2): qty 2.5, 25d supply, fill #0
  Filled 2021-01-08 (×2): qty 2.5, 25d supply, fill #1
  Filled 2021-02-25: qty 2.5, 25d supply, fill #2
  Filled 2021-04-07: qty 2.5, 25d supply, fill #3
  Filled 2021-06-03: qty 2.5, 25d supply, fill #4
  Filled 2021-07-01: qty 2.5, 25d supply, fill #5
  Filled 2021-10-02 – 2021-10-04 (×2): qty 2.5, 25d supply, fill #6
  Filled 2021-11-21: qty 2.5, 25d supply, fill #7

## 2020-12-11 ENCOUNTER — Other Ambulatory Visit (HOSPITAL_COMMUNITY): Payer: Self-pay

## 2021-01-08 ENCOUNTER — Other Ambulatory Visit (HOSPITAL_COMMUNITY): Payer: Self-pay

## 2021-01-13 ENCOUNTER — Other Ambulatory Visit (HOSPITAL_COMMUNITY): Payer: Self-pay

## 2021-01-16 ENCOUNTER — Encounter (HOSPITAL_BASED_OUTPATIENT_CLINIC_OR_DEPARTMENT_OTHER): Payer: Self-pay | Admitting: Cardiovascular Disease

## 2021-01-28 DIAGNOSIS — H5213 Myopia, bilateral: Secondary | ICD-10-CM | POA: Diagnosis not present

## 2021-01-28 DIAGNOSIS — H40013 Open angle with borderline findings, low risk, bilateral: Secondary | ICD-10-CM | POA: Diagnosis not present

## 2021-02-01 ENCOUNTER — Encounter (HOSPITAL_COMMUNITY): Payer: Self-pay | Admitting: Emergency Medicine

## 2021-02-01 ENCOUNTER — Emergency Department (HOSPITAL_COMMUNITY)
Admission: EM | Admit: 2021-02-01 | Discharge: 2021-02-01 | Disposition: A | Discharge status: Home / Self Care | Payer: 59 | Attending: Emergency Medicine | Admitting: Emergency Medicine

## 2021-02-01 ENCOUNTER — Emergency Department (HOSPITAL_COMMUNITY): Payer: 59

## 2021-02-01 DIAGNOSIS — R109 Unspecified abdominal pain: Secondary | ICD-10-CM | POA: Diagnosis not present

## 2021-02-01 DIAGNOSIS — J45909 Unspecified asthma, uncomplicated: Secondary | ICD-10-CM | POA: Insufficient documentation

## 2021-02-01 DIAGNOSIS — Z7951 Long term (current) use of inhaled steroids: Secondary | ICD-10-CM | POA: Diagnosis not present

## 2021-02-01 DIAGNOSIS — D219 Benign neoplasm of connective and other soft tissue, unspecified: Secondary | ICD-10-CM

## 2021-02-01 DIAGNOSIS — Z91040 Latex allergy status: Secondary | ICD-10-CM | POA: Insufficient documentation

## 2021-02-01 DIAGNOSIS — I10 Essential (primary) hypertension: Secondary | ICD-10-CM | POA: Insufficient documentation

## 2021-02-01 DIAGNOSIS — R110 Nausea: Secondary | ICD-10-CM | POA: Diagnosis not present

## 2021-02-01 DIAGNOSIS — M899 Disorder of bone, unspecified: Secondary | ICD-10-CM | POA: Diagnosis not present

## 2021-02-01 DIAGNOSIS — R195 Other fecal abnormalities: Secondary | ICD-10-CM | POA: Insufficient documentation

## 2021-02-01 DIAGNOSIS — Z79899 Other long term (current) drug therapy: Secondary | ICD-10-CM | POA: Diagnosis not present

## 2021-02-01 DIAGNOSIS — R1084 Generalized abdominal pain: Secondary | ICD-10-CM | POA: Diagnosis not present

## 2021-02-01 DIAGNOSIS — R1013 Epigastric pain: Secondary | ICD-10-CM

## 2021-02-01 DIAGNOSIS — D259 Leiomyoma of uterus, unspecified: Secondary | ICD-10-CM | POA: Diagnosis not present

## 2021-02-01 LAB — CBC WITH DIFFERENTIAL/PLATELET
Abs Immature Granulocytes: 0.02 10*3/uL (ref 0.00–0.07)
Basophils Absolute: 0 10*3/uL (ref 0.0–0.1)
Basophils Relative: 0 %
Eosinophils Absolute: 0.1 10*3/uL (ref 0.0–0.5)
Eosinophils Relative: 1 %
HCT: 42 % (ref 36.0–46.0)
Hemoglobin: 14 g/dL (ref 12.0–15.0)
Immature Granulocytes: 0 %
Lymphocytes Relative: 20 %
Lymphs Abs: 1.1 10*3/uL (ref 0.7–4.0)
MCH: 30.3 pg (ref 26.0–34.0)
MCHC: 33.3 g/dL (ref 30.0–36.0)
MCV: 90.9 fL (ref 80.0–100.0)
Monocytes Absolute: 0.3 10*3/uL (ref 0.1–1.0)
Monocytes Relative: 5 %
Neutro Abs: 4.1 10*3/uL (ref 1.7–7.7)
Neutrophils Relative %: 74 %
Platelets: 198 10*3/uL (ref 150–400)
RBC: 4.62 MIL/uL (ref 3.87–5.11)
RDW: 12.6 % (ref 11.5–15.5)
WBC: 5.6 10*3/uL (ref 4.0–10.5)
nRBC: 0 % (ref 0.0–0.2)

## 2021-02-01 LAB — URINALYSIS, ROUTINE W REFLEX MICROSCOPIC
Bilirubin Urine: NEGATIVE
Glucose, UA: NEGATIVE mg/dL
Hgb urine dipstick: NEGATIVE
Ketones, ur: NEGATIVE mg/dL
Leukocytes,Ua: NEGATIVE
Nitrite: NEGATIVE
Protein, ur: NEGATIVE mg/dL
Specific Gravity, Urine: 1.015 (ref 1.005–1.030)
pH: 8 (ref 5.0–8.0)

## 2021-02-01 LAB — I-STAT BETA HCG BLOOD, ED (MC, WL, AP ONLY): I-stat hCG, quantitative: <5 m[IU]/mL (ref <5)

## 2021-02-01 LAB — COMPREHENSIVE METABOLIC PANEL
ALT: 16 U/L (ref 0–44)
AST: 22 U/L (ref 15–41)
Albumin: 4 g/dL (ref 3.5–5.0)
Alkaline Phosphatase: 67 U/L (ref 38–126)
Anion gap: 8 (ref 5–15)
BUN: 15 mg/dL (ref 6–20)
CO2: 24 mmol/L (ref 22–32)
Calcium: 9.2 mg/dL (ref 8.9–10.3)
Chloride: 108 mmol/L (ref 98–111)
Creatinine, Ser: 0.72 mg/dL (ref 0.44–1.00)
GFR, Estimated: >60 mL/min (ref >60)
Glucose, Bld: 123 mg/dL — ABNORMAL HIGH (ref 70–99)
Potassium: 3.7 mmol/L (ref 3.5–5.1)
Sodium: 140 mmol/L (ref 135–145)
Total Bilirubin: 0.4 mg/dL (ref 0.3–1.2)
Total Protein: 6.8 g/dL (ref 6.5–8.1)

## 2021-02-01 LAB — LIPASE, BLOOD: Lipase: 31 U/L (ref 11–51)

## 2021-02-01 MED ORDER — OXYCODONE-ACETAMINOPHEN 5-325 MG PO TABS
1.0000 | ORAL_TABLET | Freq: Once | ORAL | Status: AC
  Administered 2021-02-01: 1 via ORAL
  Filled 2021-02-01: qty 1

## 2021-02-01 MED ORDER — IOHEXOL 300 MG/ML  SOLN
100.0000 mL | Freq: Once | INTRAMUSCULAR | Status: AC | PRN
  Administered 2021-02-01: 100 mL via INTRAVENOUS

## 2021-02-01 MED ORDER — FENTANYL CITRATE PF 50 MCG/ML IJ SOSY
50.0000 ug | PREFILLED_SYRINGE | INTRAMUSCULAR | Status: DC | PRN
  Administered 2021-02-01: 50 ug via INTRAVENOUS
  Filled 2021-02-01: qty 1

## 2021-02-01 MED ORDER — MORPHINE SULFATE (PF) 4 MG/ML IV SOLN
8.0000 mg | Freq: Once | INTRAVENOUS | Status: DC
  Filled 2021-02-01: qty 2

## 2021-02-01 MED ORDER — ESOMEPRAZOLE MAGNESIUM 40 MG PO CPDR: 40.0000 mg | DELAYED_RELEASE_CAPSULE | Freq: Every day | ORAL | 0 refills | Status: DC

## 2021-02-01 MED ORDER — ONDANSETRON 4 MG PO TBDP
4.0000 mg | ORAL_TABLET | Freq: Once | ORAL | Status: AC
  Administered 2021-02-01: 4 mg via ORAL
  Filled 2021-02-01: qty 1

## 2021-02-01 NOTE — ED Provider Notes (Signed)
[facility] EMERGENCY DEPARTMENT Provider Note   CSN: [id number] Arrival date & time: 02/01/21  1227     History  Chief Complaint  Patient presents with   Abdominal Pain    Jodi Bates is a 56 y.o. female.  HPI     56 year old female comes in with chief complaint of abdominal pain. Patient has history of hypertension and asthma.  She reports that she woke up this morning with abdominal pain on the left side that is radiating towards the right side of her abdomen and moving to the back  No history of similar pain in the past  She has had some nausea without vomiting.  She also has had 2 or 3 loose bowel movements today.  No blood in her stool.  She has no history of abdominal surgical history.  Denies any UTI-like symptoms, vaginal discharge or bleeding.   Home Medications Prior to Admission medications   Medication Sig Start Date End Date Taking? Authorizing Provider  albuterol (PROVENTIL) (2.5 MG/3ML) 0.083% nebulizer solution Take 3 mLs by nebulization every 6 (six) hours as needed for wheezing or shortness of breath. 10/14/20   Kozlow, Donnamarie Poag, MD  albuterol (VENTOLIN HFA) 108 (90 Base) MCG/ACT inhaler INHALE 2 PUFFS INTO THE LUNGS EVERY 4 HOURS AS NEEDED FOR WHEEZING OR SHORTNESSS OF BREATH Patient taking differently: Inhale 2 puffs into the lungs every 4 (four) hours as needed for shortness of breath or wheezing. 02/03/20 02/02/21  Wieters, Hallie C, PA-C  ALPRAZolam (XANAX) 0.5 MG tablet Take 1 tablet (0.5 mg total) by mouth 3 (three) times daily. Patient taking differently: Take 0.5 mg by mouth 3 (three) times daily as needed for anxiety. 05/03/20   Elby Showers, MD  amLODipine (NORVASC) 10 MG tablet Take 1 tablet (10 mg total) by mouth daily. 08/12/20   [name], MD  Blood Pressure Monitoring (OMRON 3 SERIES BP MONITOR) DEVI USE AS DIRECTED 03/15/20 03/15/21    escitalopram (LEXAPRO) 20 MG tablet Take 1 tablet (20 mg total) by mouth  daily. Patient taking differently: Take 20 mg by mouth at bedtime. 09/24/20     fluticasone furoate-vilanterol (BREO ELLIPTA) 100-25 MCG/INH AEPB INHALE 1 PUFF BY MOUTH DAILY. Patient taking differently: Inhale 1 puff into the lungs daily as needed (wheezing/shortness of breath). 04/23/20 04/23/21  Kozlow, Donnamarie Poag, MD  Ginger, Zingiber officinalis, (GINGER PO) Take 1 tablet by mouth in the morning and at bedtime. chewable    [provider]  hydrochlorothiazide (MICROZIDE) 12.5 MG capsule TAKE 1 CAPSULE (12.5 MG TOTAL) BY MOUTH DAILY. PLEASE MAKE APPOINTMENT FOR FURTHER REFILLS, THANKS! 08/12/20   [name], MD  latanoprost (XALATAN) 0.005 % ophthalmic solution Instill 1 drop into both eyes at bedtime 12/10/20     loratadine (CLARITIN) 10 MG tablet TAKE 1 TABLET (10 MG TOTAL) BY MOUTH DAILY. Patient taking differently: Take 10 mg by mouth daily as needed for allergies. 04/23/20 04/23/21  Kozlow, Donnamarie Poag, MD  losartan (COZAAR) 100 MG tablet TAKE 1 TABLET (100 MG TOTAL) BY MOUTH DAILY. 12/02/20   [name], MD  SUMAtriptan (IMITREX) 25 MG tablet Take 1 tablet (25 mg total) by mouth every 2 (two) hours as needed for migraine. May repeat in 2 hours if headache persists or recurs. 08/13/20   Suzzanne Cloud, NP  topiramate (TOPAMAX) 25 MG tablet Take 3 tablets (75 mg total) by mouth at bedtime. 08/13/20   Suzzanne Cloud, NP      Allergies  Adhesive [tape], Latex, Soap, and Other    Review of Systems   Review of Systems  Constitutional:  Positive for activity change.  Gastrointestinal:  Positive for abdominal pain, diarrhea and nausea.   Physical Exam Updated Vital Signs BP (!) 130/95 (BP Location: Right Arm)    Pulse (!) 58    Temp 98.3 F (36.8 C)    Resp 16    SpO2 100%  Physical Exam Vitals and nursing note reviewed.  Constitutional:      Appearance: She is well-developed.  HENT:     Head: Atraumatic.  Cardiovascular:     Rate and Rhythm: Normal rate.  Pulmonary:      Effort: Pulmonary effort is normal.  Abdominal:     Tenderness: There is abdominal tenderness in the right upper quadrant, epigastric area and left upper quadrant.  Musculoskeletal:     Cervical back: Normal range of motion and neck supple.  Skin:    General: Skin is warm and dry.  Neurological:     Mental Status: She is alert and oriented to person, place, and time.    ED Results / Procedures / Treatments   Labs (all labs ordered are listed, but only abnormal results are displayed) Labs Reviewed  COMPREHENSIVE METABOLIC PANEL - Abnormal; Notable for the following components:      Result Value   Glucose, Bld 123 (*)    All other components within normal limits  URINALYSIS, ROUTINE W REFLEX MICROSCOPIC - Abnormal; Notable for the following components:   APPearance CLOUDY (*)    All other components within normal limits  LIPASE, BLOOD  CBC WITH DIFFERENTIAL/PLATELET  I-STAT BETA HCG BLOOD, ED ([facility], [facility], [facility] ONLY)    EKG None  Radiology No results found.  Procedures Procedures    Medications Ordered in ED Medications  morphine 4 MG/ML injection 8 mg (8 mg Intravenous Not Given 02/01/21 1503)  fentaNYL (SUBLIMAZE) injection 50 mcg (50 mcg Intravenous Given 02/01/21 1504)  oxyCODONE-acetaminophen (PERCOCET/ROXICET) 5-325 MG per tablet 1 tablet (1 tablet Oral Given 02/01/21 1333)  ondansetron (ZOFRAN-ODT) disintegrating tablet 4 mg (4 mg Oral Given 02/01/21 1333)    ED Course/ Medical Decision Making/ A&P                           Medical Decision Making Pt comes in with cc of abdominal pain.  Abdominal pain started today, reports that it is severe.  He received some oral oxycodone outside without significant relief.  On exam she has generalized upper quadrant tenderness, right flank tenderness.  Differential diagnosis includes cholelithiasis, nephrolithiasis, pyelonephritis.  Given her age, and the fact that she is menopausal, ovarian torsion less likely but still possible.   Large ovarian cyst also less likely but possible.  Initial plan is to get CT scan of the abdomen and pelvis.  If the CT is negative then patient can be discharged.  Dr. Darl Householder will follow-up on the results.  Amount and/or Complexity of Data Reviewed Labs: ordered. Decision-making details documented in ED Course. Radiology: ordered.  Risk Prescription drug management. Parenteral controlled substances.           Final Clinical Impression(s) / ED Diagnoses Final diagnoses:  None    Rx / DC Orders ED Discharge Orders     None         Varney Biles, MD 02/01/21 1529

## 2021-02-01 NOTE — ED Provider Notes (Signed)
°  Physical Exam  BP (!) 130/95 (BP Location: Right Arm)    Pulse (!) 58    Temp 98.3 F (36.8 C)    Resp 16    SpO2 100%   Physical Exam  Procedures  Procedures  ED Course / MDM    Medical Decision Making Patient care assumed at 3 PM.  Patient is here with abdominal pain with unclear etiology.  Labs are normal.  Signout pending CT abdomen pelvis  4:17 PM CT showed some small pelvic fluid and fibroids.  There is nonspecific left sacral sclerotic lesion.  However, her pain is in the epigastrium.  I wonder if she has some gastritis.  At this point, we will put her on PPI and have her follow-up with GI outpatient.  She has a GYN doctor as well and she can follow-up with GYN regarding her fibroids  Amount and/or Complexity of Data Reviewed Independent Historian: parent External Data Reviewed: labs and radiology. Labs: ordered. Decision-making details documented in ED Course. Radiology: ordered and independent interpretation performed.          Drenda Freeze, MD 02/01/21 [phone]

## 2021-02-01 NOTE — ED Triage Notes (Signed)
Pt endorses abd pain that radiates to her back starting this morning. Pt with intermittent nausea. Pain starts in upper left quadrant and radiates across abd to back.

## 2021-02-01 NOTE — Discharge Instructions (Signed)
Please take Nexium daily.  You may benefit from an endoscopy with GI doctor   You also have fibroids and you can follow up with your GYN doctor but I do not think that is the source of your pain  You have an incidental bone lesion that appears very benign in your pelvic area but I do not think this is the source of your pain.  This can be followed up with your primary care doctor  Return to ER if you have worse abdominal pain, vomiting, fevers

## 2021-02-01 NOTE — ED Notes (Signed)
Patient transported to CT 

## 2021-02-01 NOTE — ED Provider Triage Note (Signed)
Emergency Medicine Provider Triage Evaluation Note  Jodi Bates , a 56 y.o. female  was evaluated in triage.  Pt complains of abdominal pain.  She states that same began around 930 this morning when she was eating breakfast and has not subsided.  She states that it does wax and wane.  Pain is located in the left upper quadrant and radiates to the right upper quadrant.  She endorses associated nausea without vomiting.  She has been having normal bowel movements today.  Review of Systems  Positive:  Negative: See above  Physical Exam  BP (!) 154/100 (BP Location: Right Arm)    Pulse 68    Temp 98.3 F (36.8 C)    Resp 17    SpO2 96%  Gen:   Awake, some distress   Resp:  Normal effort  MSK:   Moves extremities without difficulty  Other:  Patient in significant discomfort, LUQ abdominal tenderness, pain somewhat out of proportion to exam  Medical Decision Making  Medically screening exam initiated at 1:07 PM.  Appropriate orders placed.  Jodi Bates was informed that the remainder of the evaluation will be completed by another provider, this initial triage assessment does not replace that evaluation, and the importance of remaining in the ED until their evaluation is complete.     Bud Face, PA-C 02/01/21 1311

## 2021-02-04 ENCOUNTER — Other Ambulatory Visit (HOSPITAL_COMMUNITY): Payer: Self-pay

## 2021-02-04 MED ORDER — ESOMEPRAZOLE MAGNESIUM 40 MG PO CPDR
40.0000 mg | DELAYED_RELEASE_CAPSULE | Freq: Every day | ORAL | 0 refills | Status: DC
  Filled 2021-02-04: qty 30, 30d supply, fill #0

## 2021-02-12 ENCOUNTER — Other Ambulatory Visit (HOSPITAL_COMMUNITY): Payer: Self-pay

## 2021-02-14 ENCOUNTER — Other Ambulatory Visit (HOSPITAL_COMMUNITY): Payer: Self-pay

## 2021-02-14 ENCOUNTER — Encounter (HOSPITAL_BASED_OUTPATIENT_CLINIC_OR_DEPARTMENT_OTHER): Payer: Self-pay | Admitting: Cardiovascular Disease

## 2021-02-14 ENCOUNTER — Other Ambulatory Visit: Payer: Self-pay

## 2021-02-14 ENCOUNTER — Ambulatory Visit (HOSPITAL_BASED_OUTPATIENT_CLINIC_OR_DEPARTMENT_OTHER): Payer: 59 | Admitting: Cardiovascular Disease

## 2021-02-14 VITALS — BP 114/88 | HR 59 | Ht 64.0 in | Wt 219.6 lb

## 2021-02-14 DIAGNOSIS — R7303 Prediabetes: Secondary | ICD-10-CM | POA: Diagnosis not present

## 2021-02-14 DIAGNOSIS — I10 Essential (primary) hypertension: Secondary | ICD-10-CM

## 2021-02-14 DIAGNOSIS — R5383 Other fatigue: Secondary | ICD-10-CM | POA: Diagnosis not present

## 2021-02-14 HISTORY — DX: Prediabetes: R73.03

## 2021-02-14 MED ORDER — OZEMPIC (0.25 OR 0.5 MG/DOSE) 2 MG/1.5ML ~~LOC~~ SOPN
PEN_INJECTOR | SUBCUTANEOUS | 1 refills | Status: DC
  Filled 2021-02-14: qty 1.5, 42d supply, fill #0

## 2021-02-14 MED ORDER — OZEMPIC (1 MG/DOSE) 4 MG/3ML ~~LOC~~ SOPN
1.0000 mg | PEN_INJECTOR | SUBCUTANEOUS | 5 refills | Status: DC
  Filled 2021-02-14: qty 3, 28d supply, fill #0

## 2021-02-14 NOTE — Assessment & Plan Note (Signed)
Blood pressure has been somewhat labile.  In general her diastolic blood pressure is over 80 but her systolic is often under control.  He is unclear whether her episode of dizziness was associated with her hypertension that day.  For now, she is going to work on really intensifying her exercise and working on her diet.  She is going to enroll in the PR EP program through the [facility].  Continue amlodipine, hydrochlorothiazide, and losartan.  If her blood pressure remains elevated would consider switching losartan to valsartan and HCTZ to chlorthalidone.

## 2021-02-14 NOTE — Patient Instructions (Addendum)
Medication Instructions:  START OZEMPIC  INJECT 0.25 MG WEEKLY FOR 4 WEEKS  INCREASE TO 0.5 MG WEEKLY FOR 4 WEEKS INCREASE TO 1 MG WEEKLY FOR 4 WEEKS  *If you need a refill on your cardiac medications before your next appointment, please call your pharmacy*  Lab Work: FASTING LP/CMET IN 3 MONTHS PRIOR TO FOLLOW UP   If you have labs (blood work) drawn today and your tests are completely normal, you will receive your results only by: [place] (if you have MyChart) OR A paper copy in the mail If you have any lab test that is abnormal or we need to change your treatment, we will call you to review the results.  Testing/Procedures: NONE   Follow-Up: At [facility], you and your health needs are our priority.  As part of our continuing mission to provide you with exceptional heart care, we have created designated Provider Care Teams.  These Care Teams include your primary Cardiologist (physician) and Advanced Practice Providers (APPs -  Physician Assistants and Nurse Practitioners) who all work together to provide you with the care you need, when you need it.  We recommend signing up for the patient portal called "MyChart".  Sign up information is provided on this After Visit Summary.  MyChart is used to connect with patients for Virtual Visits (Telemedicine).  Patients are able to view lab/test results, encounter notes, upcoming appointments, etc.  Non-urgent messages can be sent to your provider as well.   To learn more about what you can do with MyChart, go to [email or web address].    Your next appointment:   3 month(s) AFTER LABS   The format for your next appointment:   In Person  Provider:   [name], MD   YOU HAVE BEEN REFERRED TO PREP ([facility]) PROGRAM IF YOU DO NOT HEAR FROM [facility] OR [facility] IN 2 WEEKS CALL THE OFFICE TO FOLLOW UP   Other Instructions  Exercise recommendations: The American Heart Association recommends 150 minutes of moderate intensity exercise  weekly. Try 30 minutes of moderate intensity exercise 4-5 times per week. This could include walking, jogging, or swimming. 5

## 2021-02-14 NOTE — Progress Notes (Signed)
Cardiology Office Note   Date:  02/14/2021   ID:  Jodi Bates November 17, 1965, MRN [id number]  PCP:  Elby Showers, MD  Cardiologist:   [name], MD   No chief complaint on file.      History of Present Illness: Jodi Bates is a 56 y.o. female with aortic atherosclerosis, asthma and hypertension who is being seen today for follow up.  She was seen 05/2016 for bradycardia and near syncope as well as hypertension.  [name] was seen in the emergency department 01/2016 for episode of near-syncope. 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 She was seen in the emergency department and her heart rate was in the 40s. At the time she was taking nebivolol. This was discontinued and she has not had any recurrent episodes.  She has struggled with poorly-controlled hypertension, so furosemide was switched to HCTZ.  At her last appointment her blood pressure was above goal so amlodipine was increased to 10 mg daily.  [name] was seen in the ED 09/2020 for chest pain. Cardiac enzymes were negative.  She was diagnosed with COVID-19.  She has been feeling better and has no more chest pain.  She still has a cough and some body aches.  She has a longstanding history of vertigo.  She saw a neurologist and they thought it was due to migraines with aura.  They prescribed sumatriptan but it makes her sleepy.  She noes that's she has allergies and congestion.  She doesn't exercise as much because she is afraid of a vertigo attack.  She has no exertional chest pain or shortness of breath.  She was able to lose 40 lb by dieting but gained some back.   She was seen in the ED 01/2021 with abdominal pain.  She had a small amount of free fluid in the pelvis.  She was also found to have a new sclerotic lesion in the left sacrum that was nonspecific and a outpatient bone scan was recommended.  She was noted to have uterine  fibroids.  She also had mild calcification of the aorta.  In retrospect he thinks it may have been a piece of fried chicken that she had eaten. In general she doesn't eat fried food and she thinks that this upset her stomach.  She notes that her sleep has been disturbed.  She is waking up before her alarm and feeling tired after work.  She notes that she does snore.  She has a Engineer, maintenance and wants to keep working on diet and exercise.  She stands plans to start exercising soon. She did have an episode of dizziness that she thinks was vertigo.  At the time her blood pressure was very elevated to 160/106.  In general her blood pressure has been 100s to 140s over 80s to 90s.  She has been working with her Engineer, maintenance.   Past Medical History:  Diagnosis Date   Allergy    Anxiety    Asthma    prn inhalers   Essential hypertension 03/22/2009   Qualifier: Diagnosis of  By: Olevia Perches, MD, Glenetta Hew    GERD (gastroesophageal reflux disease)    Gluteal cleft wound 03/2012   recurrent gluteal cleft infections   Headache(784.0)    tension   Hypertension    under control with meds., has been on med. > 5 yr.   Iron deficiency anemia  no current problems or meds.   Migraine headache with aura 04/09/2020   Morbid obesity ([place]) 11/10/2020   Prediabetes 02/14/2021    Past Surgical History:  Procedure Laterality Date   BREAST CYST EXCISION     BREAST EXCISIONAL BIOPSY Left    BREAST SURGERY      lt breast/ benign cyst   CYST EXCISION     back of head   HYSTEROSCOPY WITH D & C  06/25/2004   INCISION AND DRAINAGE PERIRECTAL ABSCESS N/A 02/10/2013   Procedure: IRRIGATION AND DEBRIDEMENT PERIRECTAL ABSCESS;  Surgeon: Gwenyth Ober, MD;  Location: [facility];  Service: General;  Laterality: N/A;   LAPAROSCOPIC ENDOMETRIOSIS FULGURATION     PILONIDAL CYST EXCISION N/A 04/28/2012   Procedure: exam under anesthesia and exicision of pilonidal;  Surgeon: Gwenyth Ober, MD;  Location: [facility];  Service: General;  Laterality: N/A;   POLYPECTOMY     uterine   TUBAL LIGATION       Current Outpatient Medications  Medication Sig Dispense Refill   albuterol (PROVENTIL) (2.5 MG/3ML) 0.083% nebulizer solution Take 3 mLs by nebulization every 6 (six) hours as needed for wheezing or shortness of breath. 75 mL 1   albuterol (VENTOLIN HFA) 108 (90 Base) MCG/ACT inhaler INHALE 2 PUFFS INTO THE LUNGS EVERY 4 HOURS AS NEEDED FOR WHEEZING OR SHORTNESSS OF BREATH (Patient taking differently: Inhale 2 puffs into the lungs every 4 (four) hours as needed for shortness of breath or wheezing.) 8.5 g 1   ALPRAZolam (XANAX) 0.5 MG tablet Take 1 tablet (0.5 mg total) by mouth 3 (three) times daily. (Patient taking differently: Take 0.5 mg by mouth 3 (three) times daily as needed for anxiety.) 90 tablet 1   amLODipine (NORVASC) 10 MG tablet Take 1 tablet (10 mg total) by mouth daily. 90 tablet 3   Blood Pressure Monitoring (OMRON 3 SERIES BP MONITOR) DEVI USE AS DIRECTED 1 each 0   escitalopram (LEXAPRO) 20 MG tablet Take 1 tablet (20 mg total) by mouth daily. (Patient taking differently: Take 20 mg by mouth at bedtime.) 90 tablet 4   esomeprazole (NEXIUM) 40 MG capsule Take 1 capsule (40 mg total) by mouth daily. 30 capsule 0   fluticasone furoate-vilanterol (BREO ELLIPTA) 100-25 MCG/INH AEPB INHALE 1 PUFF BY MOUTH DAILY. (Patient taking differently: Inhale 1 puff into the lungs daily as needed (wheezing/shortness of breath).) 60 each 5   Ginger, Zingiber officinalis, (GINGER PO) Take 1 tablet by mouth in the morning and at bedtime. chewable     hydrochlorothiazide (MICROZIDE) 12.5 MG capsule TAKE 1 CAPSULE (12.5 MG TOTAL) BY MOUTH DAILY. PLEASE MAKE APPOINTMENT FOR FURTHER REFILLS, THANKS! 90 capsule 3   latanoprost (XALATAN) 0.005 % ophthalmic solution Instill 1 drop into both eyes at bedtime 2.5 mL 11   loratadine (CLARITIN) 10 MG tablet TAKE 1 TABLET (10 MG TOTAL) BY MOUTH DAILY. (Patient taking  differently: Take 10 mg by mouth daily as needed for allergies.) 30 tablet 5   losartan (COZAAR) 100 MG tablet TAKE 1 TABLET (100 MG TOTAL) BY MOUTH DAILY. 90 tablet 1   Semaglutide, 1 MG/DOSE, (OZEMPIC, 1 MG/DOSE,) 4 MG/3ML SOPN Inject 1 mg into the skin once a week. (Start after you finish the first 8 weeks). 3 mL 5   Semaglutide,0.25 or 0.5MG /DOS, (OZEMPIC, 0.25 OR 0.5 MG/DOSE,) 2 MG/1.5ML SOPN Inject 0.25 mg into the skin once a week for 28 days, THEN 0.5 mg once a week for 28 days. 1.5  mL 1   SUMAtriptan (IMITREX) 25 MG tablet Take 1 tablet (25 mg total) by mouth every 2 (two) hours as needed for migraine. May repeat in 2 hours if headache persists or recurs. 10 tablet 5   topiramate (TOPAMAX) 25 MG tablet Take 3 tablets (75 mg total) by mouth at bedtime. 270 tablet 3   No current facility-administered medications for this visit.    Allergies:   Adhesive [tape], Latex, Soap, and Other    Social History:  The patient  reports that she has never smoked. She has never used smokeless tobacco. She reports that she does not drink alcohol and does not use drugs.   Family History:  The patient's family history includes Allergic rhinitis in her mother and son; Asthma in her brother and son; Breast cancer in her maternal grandmother; Cancer in her father and maternal grandmother; Diabetes in her brother and mother; Esophageal cancer in her father; Heart failure in her mother; Hypertension in her mother; Pancreatic cancer in her brother; Sleep apnea in her brother.    ROS:  Please see the history of present illness.   Otherwise, review of systems are positive for headaches.   All other systems are reviewed and negative.    PHYSICAL EXAM: VS:  BP 114/88 (BP Location: Right Arm, Patient Position: Sitting, Cuff Size: Large)    Pulse (!) 59    Ht 5\' 4"  (1.626 m)    Wt 219 lb 9.6 oz (99.6 kg)    SpO2 98%    BMI 37.69 kg/m  , BMI Body mass index is 37.69 kg/m. GENERAL:  Well appearing HEENT: Pupils  equal round and reactive, fundi not visualized, oral mucosa unremarkable NECK:  No jugular venous distention, waveform within normal limits, carotid upstroke brisk and symmetric, no bruits, no thyromegaly LUNGS:  Clear to auscultation bilaterally HEART:  RRR.  PMI not displaced or sustained,S1 and S2 within normal limits, no S3, no S4, no clicks, no rubs, no murmurs ABD:  Flat, positive bowel sounds normal in frequency in pitch, no bruits, no rebound, no guarding, no midline pulsatile mass, no hepatomegaly, no splenomegaly EXT:  2 plus pulses throughout, no edema, no cyanosis no clubbing SKIN:  No rashes no nodules NEURO:  Cranial nerves II through XII grossly intact, motor grossly intact throughout PSYCH:  Cognitively intact, oriented to person place and time   EKG:  EKG is not ordered today. The ekg ordered 06/04/16 demonstrates sinus arrhythmia.  Rate 59 bpm.  Low voltage limb leads.  Cannot rule out prior septal infarct.   CT Abd/pelvis 02/01/21: IMPRESSION: 1. No cause for the patient's acute symptoms identified. 2. A small amount of free fluid is seen in the pelvis. If the patient has not completed menopause, this could be physiologic. Otherwise, the finding is nonspecific. Recommend clinical correlation. 3. A new sclerotic lesion in the left sacrum is nonspecific. Recommend a bone scan as an outpatient for further evaluation. 4. Mild calcified atherosclerosis in the nonaneurysmal aorta. 5. Fibroid uterus.    Recent Labs: 02/01/2021: ALT 16; BUN 15; Creatinine, Ser 0.72; Hemoglobin 14.0; Platelets 198; Potassium 3.7; Sodium 140    Lipid Panel    Component Value Date/Time   CHOL 185 11/11/2020 0700   TRIG 85 11/11/2020 0700   HDL 59 11/11/2020 0700   CHOLHDL 3.1 11/11/2020 0700   CHOLHDL 3.2 03/25/2015 0905   VLDL 16 03/25/2015 0905   LDLCALC 110 (H) 11/11/2020 0700      Wt Readings from Last 3  Encounters:  02/14/21 219 lb 9.6 oz (99.6 kg)  11/08/20 217 lb 4.8 oz (98.6  kg)  10/12/20 207 lb (93.9 kg)      ASSESSMENT AND PLAN:  Essential hypertension Blood pressure has been somewhat labile.  In general her diastolic blood pressure is over 80 but her systolic is often under control.  He is unclear whether her episode of dizziness was associated with her hypertension that day.  For now, she is going to work on really intensifying her exercise and working on her diet.  She is going to enroll in the PR EP program through the [facility].  Continue amlodipine, hydrochlorothiazide, and losartan.  If her blood pressure remains elevated would consider switching losartan to valsartan and HCTZ to chlorthalidone.  Morbid obesity ([place]) Referral to PREP and start Ozempic.  Prediabetes Fasting blood glucoses have been elevated.  Referral to PrEP and start Ozempic 0.25 mg weekly for 4 weeks followed by 0.5 mg weekly for 4 weeks followed by 1 mg weekly for 4 weeks.  We did discuss nausea as a potential side effect and the importance of eating slowly.  Fatigue It is possible that sleep apnea could be contributing.  She is not interested in getting a sleep study at this time.  We will continue focusing on diet and exercise as above.  She is not anemic and her thyroid function has been normal with her PCP.    Current medicines are reviewed at length with the patient today.  The patient does not have concerns regarding medicines.  The following changes have been made:  None  Labs/ tests ordered today include:   Orders Placed This Encounter  Procedures   Comprehensive metabolic panel   Lipid panel   HgB A1c   Amb Referral To Provider Referral Exercise Program (P.R.E.P)      Disposition:   FU with Malarie Tappen C. Oval Linsey, MD, [facility] as needed.    This note was written with the assistance of speech recognition software.  Please excuse any transcriptional errors.  Signed, Ilya Neely C. Oval Linsey, MD, [facility]  02/14/2021 6:21 PM    [place]

## 2021-02-14 NOTE — Assessment & Plan Note (Signed)
Fasting blood glucoses have been elevated.  Referral to PrEP and start Ozempic 0.25 mg weekly for 4 weeks followed by 0.5 mg weekly for 4 weeks followed by 1 mg weekly for 4 weeks.  We did discuss nausea as a potential side effect and the importance of eating slowly.

## 2021-02-14 NOTE — Assessment & Plan Note (Signed)
Referral to PREP and start Ozempic.

## 2021-02-14 NOTE — Assessment & Plan Note (Signed)
It is possible that sleep apnea could be contributing.  She is not interested in getting a sleep study at this time.  We will continue focusing on diet and exercise as above.  She is not anemic and her thyroid function has been normal with her PCP.

## 2021-02-19 ENCOUNTER — Encounter (HOSPITAL_BASED_OUTPATIENT_CLINIC_OR_DEPARTMENT_OTHER): Payer: Self-pay | Admitting: Cardiovascular Disease

## 2021-02-19 ENCOUNTER — Telehealth (HOSPITAL_BASED_OUTPATIENT_CLINIC_OR_DEPARTMENT_OTHER): Payer: Self-pay | Admitting: Cardiovascular Disease

## 2021-02-19 DIAGNOSIS — R7303 Prediabetes: Secondary | ICD-10-CM

## 2021-02-19 DIAGNOSIS — Z6837 Body mass index (BMI) 37.0-37.9, adult: Secondary | ICD-10-CM

## 2021-02-19 NOTE — Telephone Encounter (Signed)
Received fax from [facility]. Pharmacy for [place] for [place]. Started PA in CoverMyMeds today.  Diamond Nickel (Key: [id number]) Ozempic (0.25 or 0.5 MG/DOSE) 2MG /1.5ML pen-injectors   Form MedImpact ePA Form 2017 NCPDP  Determination Wait for Determination  Please wait for MedImpact 2017 to return a determination.

## 2021-02-20 NOTE — Telephone Encounter (Signed)
Diamond Nickel (Key: [id number]) Ozempic (0.25 or 0.5 MG/DOSE) 2MG/1.5ML pen-injectors   Form MedImpact ePA Form 2017 NCPDP  Determination Unfavorable   Message from Plan This request has not been approved. Based on the information submitted for review, you did not meet our guideline rules for the requested drug. In order for your request to be approved, your provider would need to show that you have met the guideline rules below. The details below are written in medical language. If you have questions, please contact your provider. In some cases, the requested medication or alternatives offered may have additional approval requirements. Our guideline named GLP-1 STEP OVERRIDE (reviewed for Ozempic) requires the following rules to be met for approval:1. You have a diagnosis of type 2 diabetes mellitus (a chronic condition in which your body is resistant to insulin, a hormone that regulates your blood sugar). 2. You have tried taking metformin, a metformin combination product, a covered sulfonylurea (such as glimepiride, glipizide, or glyburide), a sulfonylurea combination product, pioglitazone OR a pioglitazone combination product, unless there is a medical reason why you cannot (contraindication to).Your doctor requested this medication for weight loss or weight management and prediabetes (a health condition that means your blood sugar level is higher than normal, but not yet high enough for you to be diagnosed with diabetes).This request was denied because we did not receive information that you meet the requirements listed above. Please work with your doctor to use a different medication or get [place] more information if it will allow [place] to approve this request. A written notification letter will follow with additional details.

## 2021-02-20 NOTE — Telephone Encounter (Signed)
Patient denied for Ozempic

## 2021-02-21 ENCOUNTER — Other Ambulatory Visit (HOSPITAL_COMMUNITY): Payer: Self-pay

## 2021-02-21 ENCOUNTER — Telehealth: Payer: Self-pay

## 2021-02-21 MED ORDER — WEGOVY 1 MG/0.5ML ~~LOC~~ SOAJ
1.0000 mg | SUBCUTANEOUS | 0 refills | Status: DC
  Filled 2021-02-21: qty 2, fill #0
  Filled 2021-04-22: qty 2, 28d supply, fill #0

## 2021-02-21 MED ORDER — WEGOVY 0.5 MG/0.5ML ~~LOC~~ SOAJ
0.5000 mg | SUBCUTANEOUS | 0 refills | Status: DC
  Filled 2021-02-21: qty 2, fill #0
  Filled 2021-03-18: qty 2, 28d supply, fill #0

## 2021-02-21 MED ORDER — WEGOVY 0.25 MG/0.5ML ~~LOC~~ SOAJ
0.2500 mg | SUBCUTANEOUS | 0 refills | Status: DC
  Filled 2021-02-21 – 2021-03-03 (×3): qty 2, 28d supply, fill #0

## 2021-02-21 NOTE — Telephone Encounter (Signed)
Called to discuss PREP referral, left voicemail

## 2021-02-21 NOTE — Telephone Encounter (Signed)
She returned my call, explained PREP; she works 10 hr shifts so T/Th evening at [place] fits her schedule best. Will have Pam, RN [facility] contact her about next class starting in March.

## 2021-02-21 NOTE — Telephone Encounter (Signed)
Prior auth submitted for [employer] [facility]) as she is non-diabetic and overweight. Will take 24-72 hours for response. See MyChart encounter 02/19/21.  Unable to submit prior auth for [facility] via CoverMyMeds. Called her health insurance to submit via phone. Prior authorization submitted and will take 24-72 hours to get response.    Plan for Wegovy: 0.25mg  one weekly x 4 weeks  0.5mg  once weekly x 4 weeks 1 mg once weekly x 4 weeks   ICD 10: Z68.37, E66.01   Of note, prior auth only submitted for 0.25mg  dose. Will likely require new prior authorization for 0.5mg  and 1mg  doses.  [name], NP

## 2021-02-21 NOTE — Telephone Encounter (Signed)
Patient seen 02/14/21 for Dr. Oval Linsey and prescribed Ozempic for weight loss. Given she is prediabetic and not diabetic will instead utilize [place] [employer].  Unable to submit prior auth for [facility] via CoverMyMeds. Called her health insurance to submit via phone. Prior authorization submitted and will take 24-72 hours to get response.   Plan for Wegovy: 0.25mg  one weekly x 4 weeks  0.5mg  once weekly x 4 weeks 1 mg once weekly x 4 weeks  ICD 10: Z68.37, E66.01  Of note, prior auth only submitted for 0.25mg  dose. Will likely require new prior authorization for 0.5mg  and 1mg  doses.  Best,  [name], NP

## 2021-02-24 NOTE — Telephone Encounter (Signed)
Called insurance. No determination yet on prior authorization.   [name], NP

## 2021-02-24 NOTE — Addendum Note (Signed)
Addended by: Alvina Filbert B on: 02/24/2021 06:20 PM   Modules accepted: Orders

## 2021-02-24 NOTE — Telephone Encounter (Signed)
[name], MD to [name], NP  Me    11:07 AM Thank you.  She also needs to come back and get her A1c tested.  If this is elevated will make it much easier to get her medication paid for.   TCR   Advised patient, she will likely go tomorrow since she is off

## 2021-02-25 ENCOUNTER — Other Ambulatory Visit (HOSPITAL_COMMUNITY): Payer: Self-pay

## 2021-02-25 ENCOUNTER — Encounter (HOSPITAL_BASED_OUTPATIENT_CLINIC_OR_DEPARTMENT_OTHER): Payer: Self-pay | Admitting: Family

## 2021-02-25 DIAGNOSIS — R7303 Prediabetes: Secondary | ICD-10-CM | POA: Diagnosis not present

## 2021-02-25 DIAGNOSIS — I10 Essential (primary) hypertension: Secondary | ICD-10-CM | POA: Diagnosis not present

## 2021-02-25 NOTE — Telephone Encounter (Signed)
Received notice from MedImpact requesting additional information.  Sent documentation confirming that she is not taking another GLP-1 receptor agonist and is currently enrolled in an exercise and calorie reduction program.  See letter in [place]. Faxed to [place].   Prior Auth # [place], NP

## 2021-02-26 ENCOUNTER — Other Ambulatory Visit (HOSPITAL_COMMUNITY): Payer: Self-pay

## 2021-02-26 LAB — HEMOGLOBIN A1C
Est. average glucose Bld gHb Est-mCnc: 114 mg/dL
Hgb A1c MFr Bld: 5.6 % (ref 4.8–5.6)

## 2021-02-26 LAB — COMPREHENSIVE METABOLIC PANEL
ALT: 15 IU/L (ref 0–32)
AST: 18 IU/L (ref 0–40)
Albumin/Globulin Ratio: 2 (ref 1.2–2.2)
Albumin: 4.3 g/dL (ref 3.8–4.9)
Alkaline Phosphatase: 78 IU/L (ref 44–121)
BUN/Creatinine Ratio: 18 (ref 9–23)
BUN: 14 mg/dL (ref 6–24)
Bilirubin Total: 0.4 mg/dL (ref 0.0–1.2)
CO2: 23 mmol/L (ref 20–29)
Calcium: 9.4 mg/dL (ref 8.7–10.2)
Chloride: 108 mmol/L — ABNORMAL HIGH (ref 96–106)
Creatinine, Ser: 0.78 mg/dL (ref 0.57–1.00)
Globulin, Total: 2.2 g/dL (ref 1.5–4.5)
Glucose: 86 mg/dL (ref 70–99)
Potassium: 3.4 mmol/L — ABNORMAL LOW (ref 3.5–5.2)
Sodium: 146 mmol/L — ABNORMAL HIGH (ref 134–144)
Total Protein: 6.5 g/dL (ref 6.0–8.5)
eGFR: 90 mL/min/{1.73_m2} (ref >59)

## 2021-02-26 LAB — LIPID PANEL
Chol/HDL Ratio: 3.3 ratio (ref 0.0–4.4)
Cholesterol, Total: 173 mg/dL (ref 100–199)
HDL: 52 mg/dL (ref >39)
LDL Chol Calc (NIH): 103 mg/dL — ABNORMAL HIGH (ref 0–99)
Triglycerides: 97 mg/dL (ref 0–149)
VLDL Cholesterol Cal: 18 mg/dL (ref 5–40)

## 2021-02-27 NOTE — Telephone Encounter (Signed)
My Chart message sent to patient to make her aware.     [name], NP

## 2021-02-27 NOTE — Telephone Encounter (Signed)
Received fax from [place] stating Wegovy 0.25 mg/0.5 mL PEN has been approved for a max of 3 fills from 02/27/21-05/26/21. (Auth # [id number])----Approved for 2 mL per 28 days.  Additional authorizations have been entered for the following:  Wegovy 0.5 mg allowing 2 mL per 28 days--please reference auth # [id number]  Wegovy 1 mg allowing 2 mL per 28 days--please reference auth # [id number]  Wegovy 1.7 mg allowing 3 mL per 28 days--please reference auth # [id number]  Wegovy 2.4 mg allowing 3 mL per 28 days--please reference auth # [id number]  ----Have not received approvals yet for the above listed doses.

## 2021-03-03 ENCOUNTER — Other Ambulatory Visit (HOSPITAL_COMMUNITY): Payer: Self-pay

## 2021-03-07 ENCOUNTER — Telehealth (HOSPITAL_BASED_OUTPATIENT_CLINIC_OR_DEPARTMENT_OTHER): Payer: Self-pay | Admitting: *Deleted

## 2021-03-07 ENCOUNTER — Other Ambulatory Visit (HOSPITAL_COMMUNITY): Payer: Self-pay

## 2021-03-07 ENCOUNTER — Telehealth: Payer: Self-pay

## 2021-03-07 MED ORDER — SPIRONOLACTONE 25 MG PO TABS
25.0000 mg | ORAL_TABLET | Freq: Every day | ORAL | 1 refills | Status: DC
  Filled 2021-03-07: qty 90, 90d supply, fill #0
  Filled 2021-05-27: qty 90, 90d supply, fill #1

## 2021-03-07 NOTE — Telephone Encounter (Signed)
VMT pt requesting call back about start of PREP on 04/08/21 615p to 730p T/TH.

## 2021-03-07 NOTE — Telephone Encounter (Signed)
-----   Message from [name], MD sent at 03/05/2021 10:05 AM EST ----- Kidney function and liver function are normal.  Potassium is a little bit low.  Lets try switching HCTZ to spironolactone 25 mg daily instead.  This should help with her potassium and avoid is having to add a potassium supplement.  Check blood pressures and lets follow-up in a month or 2 to make sure it is better controlled.

## 2021-03-18 ENCOUNTER — Other Ambulatory Visit (HOSPITAL_COMMUNITY): Payer: Self-pay

## 2021-03-26 ENCOUNTER — Other Ambulatory Visit (HOSPITAL_COMMUNITY): Payer: Self-pay

## 2021-03-31 ENCOUNTER — Telehealth: Payer: Self-pay

## 2021-03-31 NOTE — Telephone Encounter (Signed)
Attempted to confirm participation in next PREP class at [place] on 04/15/21-will cont to try to reach ?

## 2021-04-07 ENCOUNTER — Other Ambulatory Visit (HOSPITAL_COMMUNITY): Payer: Self-pay

## 2021-04-09 NOTE — Progress Notes (Signed)
[facility] PREP Evaluation ? ?Patient Details  ?Name: Jodi Bates ?MRN: [id number] ?Date of Birth: [birth year] ?Age: 56 y.o. ?PCP: Elby Showers, MD ? ?Vitals:  ? 04/08/21 1900  ?BP: (!) 138/96  ?Pulse: 61  ?SpO2: 98%  ?Weight: 219 lb 6.4 oz (99.5 kg)  ? ? ? [facility] Eval - 04/09/21 1400   ? ?  ? [facility]PREP[facility] Location  ? [facility] "PREP" Location [place]   ?  ? Referral   ? Referring Provider Oval Linsey   ? Reason for referral Hypertension   ? Program Start Date 04/15/21   T/Th 615p-730p x 12 wks  ?  ? Measurement  ? Waist Circumference 40 inches   ? Hip Circumference 46 inches   ? Body fat 46 percent   ?  ? Information for Trainer  ? Goals Get BP better, goal weight 160-180 lbs   ? Current Exercise Walks 4-6x a week for 30 min each   ? Orthopedic Concerns None   ? Pertinent Medical History HTN, inc chol, vertigo, asthma   ? Current Barriers none   ? Medications that affect exercise Medication causing dizziness/drowsiness;Asthma inhaler;Beta blocker   ?  ? Timed Up and Go (TUGS)  ? Timed Up and Go Low risk <9 seconds   ?  ? Mobility and Daily Activities  ? I find it easy to walk up or down two or more flights of stairs. 2   ? I have no trouble taking out the trash. 4   ? I do housework such as vacuuming and dusting on my own without difficulty. 4   ? I can easily lift a gallon of milk (8lbs). 4   ? I can easily walk a mile. 4   ? I have no trouble reaching into high cupboards or reaching down to pick up something from the floor. 3   ? I do not have trouble doing out-door work such as Armed forces logistics/support/administrative officer, raking leaves, or gardening. 3   ?  ? Mobility and Daily Activities  ? I feel younger than my age. 3   ? I feel independent. 4   ? I feel energetic. 2   ? I live an active life.  2   ? I feel strong. 3   ? I feel healthy. 3   ? I feel active as other people my age. 3   ?  ? How fit and strong are you.  ? Fit and Strong Total Score 44   ? ?  ?  ? ?  ? ?Past Medical History:  ?Diagnosis Date  ? Allergy   ? Anxiety   ?  Asthma   ? prn inhalers  ? Essential hypertension 03/22/2009  ? Qualifier: Diagnosis of  By: Olevia Perches, MD, Glenetta Hew   ? GERD (gastroesophageal reflux disease)   ? 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 recurrent gluteal cleft infections  ? Headache(784.0)   ? tension  ? Hypertension   ? under control with meds., has been on med. > 5 yr.  ? Iron deficiency anemia   ? no current problems or meds.  ? Migraine headache with aura 04/09/2020  ? Morbid obesity ([place]) 11/10/2020  ? Prediabetes 02/14/2021  ? ?Past Surgical History:  ?Procedure Laterality Date  ? BREAST CYST EXCISION    ? BREAST EXCISIONAL BIOPSY Left   ? BREAST SURGERY    ?  lt breast/ benign cyst  ? CYST EXCISION    ? back of head  ?  HYSTEROSCOPY WITH D & C  06/25/2004  ? INCISION AND DRAINAGE PERIRECTAL ABSCESS N/A 02/10/2013  ? Procedure: IRRIGATION AND DEBRIDEMENT PERIRECTAL ABSCESS;  Surgeon: Gwenyth Ober, MD;  Location: [facility];  Service: General;  Laterality: N/A;  ? LAPAROSCOPIC ENDOMETRIOSIS FULGURATION    ? PILONIDAL CYST EXCISION N/A 04/28/2012  ? Procedure: exam under anesthesia and exicision of pilonidal;  Surgeon: Gwenyth Ober, MD;  Location: [facility];  Service: General;  Laterality: N/A;  ? POLYPECTOMY    ? uterine  ? TUBAL LIGATION    ? ?Social History  ? ?Tobacco Use  ?Smoking Status Never  ?Smokeless Tobacco Never  ? ? ?Barnett Hatter ?04/09/2021, 3:01 PM ? ? ?

## 2021-04-14 NOTE — Progress Notes (Signed)
? ? ?PATIENT: Jodi Bates ?DOB: 01-05-1966 ? ?REASON FOR VISIT: follow up for migraine ?HISTORY FROM: patient ?Primary Neurologist: Dr. Jannifer Franklin, now Dr. Billey Gosling ? ?HISTORY OF PRESENT ILLNESS: ?Today 04/15/21 ?Jodi Bates here today for follow up. Remains on Topamax 75 mg at bedtime. Hasn't taken the Imitrex. Headaches are not severe, doesn't need any rescue medication. Headache syndrome starts as dizziness, then bifrontal headache, none of this in months, since COVID back in October, during [place] Dr. Jannifer Franklin sent in 2 rounds of Decadron with excellent benefit. Saw ENT for vertigo. Back in November, felt good candidate for vestibular rehab, but never heard from referral. Now, no issues with vertigo, but is careful with position changes with lying down. Monitors blood pressure closely. On weight loss injection, Wegovy since Feb, is exercising. [facility]  ? ?08/13/2020 SS: Jodi Bates is a 56 year old female with history of migraine headache with vertigo.  Is on Topamax, Maxalt as needed. Has the dizziness sensation then develops bifrontal headache. Doing well with Topamax 75 mg at bedtime. Less spells, noticed it is correlated with diet. June 23rd last spell. No side effects from the Topamax. Stopped the Maxalt, only took 1/2 tablet, made her head feel like vice grip. It did make the headache go away. Claims is 90% better. No changes to medical history. 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  Overall, much improved.  Here today alone. ? ?HISTORY  ?04/09/2020 Dr. Jannifer Franklin: Jodi Bates is a 56 year old right-handed black female with a history of migraine headaches since she was a teenager.  The patient has a prominent family history for migraine, her son and daughter and her mother also have headaches.  The patient has been seen and evaluated through this office in 2013 for episodes of vertigo and headache that have occurred together since 2008.  The patient had an event about 10 days ago with  more severe symptoms.  She was driving to work and started having some problems with vertigo with a spinning sensation and associated nausea without vomiting.  The patient was off balance when she try to get to work and needed help to get to work and had to leave early.  She developed a bifrontal headache which is typical for these events.  The patient at times may have a feeling of muffled hearing in the right greater than left ear, she may at times have some tinnitus that may occur with these events.  The patient denies any double vision or loss of vision, she denies any slurred speech or problems swallowing.  She has never had any syncope.  She denies any cognitive clouding with the events.  The patient denies any problems with neck stiffness.  She may take alprazolam and Pepto-Bismol for the events.  She has never been on any medications for migraine.  She does not drink any caffeinated products during the day.  She is sent to this office for further evaluation of the above events.  She was seen by Dr. Leta Baptist through this office in 2013 for episodes of vertigo, MRI of the brain at that time was unremarkable. ? ?REVIEW OF SYSTEMS: Out of a complete 14 system review of symptoms, the patient complains only of the following symptoms, and all other reviewed systems are negative. ? ?See HPI ? ?ALLERGIES: ?Allergies  ?Allergen Reactions  ? Adhesive [Tape] Itching and Other (See Comments)  ?  Reaction:  Blisters   ? Latex Itching and Rash  ? Soap Other (See Comments)  ?  Soaps that contain any kind of fragrance triggers an asthma attack.    ? Other   ?  Fragrances ?  ? ? ?HOME MEDICATIONS: ?Outpatient Medications Prior to Visit  ?Medication Sig Dispense Refill  ? albuterol (PROVENTIL) (2.5 MG/3ML) 0.083% nebulizer solution Take 3 mLs by nebulization every 6 (six) hours as needed for wheezing or shortness of breath. 75 mL 1  ? ALPRAZolam (XANAX) 0.5 MG tablet Take 1 tablet (0.5 mg total) by mouth 3 (three) times  daily. (Patient taking differently: Take 0.5 mg by mouth 3 (three) times daily as needed for anxiety.) 90 tablet 1  ? amLODipine (NORVASC) 10 MG tablet Take 1 tablet (10 mg total) by mouth daily. 90 tablet 3  ? escitalopram (LEXAPRO) 20 MG tablet Take 1 tablet (20 mg total) by mouth daily. (Patient taking differently: Take 20 mg by mouth at bedtime.) 90 tablet 4  ? esomeprazole (NEXIUM) 40 MG capsule Take 1 capsule (40 mg total) by mouth daily. 30 capsule 0  ? fluticasone furoate-vilanterol (BREO ELLIPTA) 100-25 MCG/INH AEPB INHALE 1 PUFF BY MOUTH DAILY. (Patient taking differently: Inhale 1 puff into the lungs daily as needed (wheezing/shortness of breath).) 60 each 5  ? Ginger, Zingiber officinalis, (GINGER PO) Take 1 tablet by mouth in the morning and at bedtime. chewable    ? latanoprost (XALATAN) 0.005 % ophthalmic solution Instill 1 drop into both eyes at bedtime 2.5 mL 11  ? loratadine (CLARITIN) 10 MG tablet TAKE 1 TABLET (10 MG TOTAL) BY MOUTH DAILY. (Patient taking differently: Take 10 mg by mouth daily as needed for allergies.) 30 tablet 5  ? losartan (COZAAR) 100 MG tablet TAKE 1 TABLET (100 MG TOTAL) BY MOUTH DAILY. 90 tablet 1  ? Semaglutide-Weight Management (WEGOVY) 0.5 MG/0.5ML SOAJ Inject 0.5 mg into the skin once a week. 2 mL 0  ? [START ON 04/21/2021] Semaglutide-Weight Management (WEGOVY) 1 MG/0.5ML SOAJ Inject 1 mg into the skin once a week. 2 mL 0  ? spironolactone (ALDACTONE) 25 MG tablet Take 1 tablet (25 mg total) by mouth daily. 90 tablet 1  ? SUMAtriptan (IMITREX) 25 MG tablet Take 1 tablet (25 mg total) by mouth every 2 (two) hours as needed for migraine. May repeat in 2 hours if headache persists or recurs. 10 tablet 5  ? topiramate (TOPAMAX) 25 MG tablet Take 3 tablets (75 mg total) by mouth at bedtime. 270 tablet 3  ? albuterol (VENTOLIN HFA) 108 (90 Base) MCG/ACT inhaler INHALE 2 PUFFS INTO THE LUNGS EVERY 4 HOURS AS NEEDED FOR WHEEZING OR SHORTNESSS OF BREATH (Patient taking  differently: Inhale 2 puffs into the lungs every 4 (four) hours as needed for shortness of breath or wheezing.) 8.5 g 1  ? Semaglutide-Weight Management (WEGOVY) 0.25 MG/0.5ML SOAJ Inject 0.25 mg into the skin once a week. 2 mL 0  ? ?No facility-administered medications prior to visit.  ? ? ?PAST MEDICAL HISTORY: ?Past Medical History:  ?Diagnosis Date  ? Allergy   ? Anxiety   ? Asthma   ? prn inhalers  ? Essential hypertension 03/22/2009  ? Qualifier: Diagnosis of  By: Olevia Perches, MD, Glenetta Hew   ? GERD (gastroesophageal reflux disease)   ? 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 recurrent gluteal cleft infections  ? Headache(784.0)   ? tension  ? Hypertension   ? under control with meds., has been on med. > 5 yr.  ? Iron deficiency anemia   ? no current problems or meds.  ? Migraine headache  with aura 04/09/2020  ? Morbid obesity ([place]) 11/10/2020  ? Prediabetes 02/14/2021  ? ? ?PAST SURGICAL HISTORY: ?Past Surgical History:  ?Procedure Laterality Date  ? BREAST CYST EXCISION    ? BREAST EXCISIONAL BIOPSY Left   ? BREAST SURGERY    ?  lt breast/ benign cyst  ? CYST EXCISION    ? back of head  ? HYSTEROSCOPY WITH D & C  06/25/2004  ? INCISION AND DRAINAGE PERIRECTAL ABSCESS N/A 02/10/2013  ? Procedure: IRRIGATION AND DEBRIDEMENT PERIRECTAL ABSCESS;  Surgeon: Gwenyth Ober, MD;  Location: [place];  Service: General;  Laterality: N/A;  ? LAPAROSCOPIC ENDOMETRIOSIS FULGURATION    ? PILONIDAL CYST EXCISION N/A 04/28/2012  ? Procedure: exam under anesthesia and exicision of pilonidal;  Surgeon: Gwenyth Ober, MD;  Location: [place];  Service: General;  Laterality: N/A;  ? POLYPECTOMY    ? uterine  ? TUBAL LIGATION    ? ? ?FAMILY HISTORY: ?Family History  ?Problem Relation Age of Onset  ? Diabetes Mother   ? Hypertension Mother   ? Allergic rhinitis Mother   ? Heart failure Mother   ? Cancer Father   ?     deceased metastaic throat cancer  ? Esophageal cancer Father   ? Diabetes Brother   ? Pancreatic cancer Brother    ? Asthma Brother   ? Sleep apnea Brother   ? Cancer Maternal Grandmother   ?     breast  ? Breast cancer Maternal Grandmother   ? Allergic rhinitis Son   ? Asthma Son   ? Colon cancer Neg Hx   ? Rectal cancer Ne

## 2021-04-15 ENCOUNTER — Other Ambulatory Visit (HOSPITAL_COMMUNITY): Payer: Self-pay

## 2021-04-15 ENCOUNTER — Ambulatory Visit: Payer: 59 | Admitting: Neurology

## 2021-04-15 ENCOUNTER — Other Ambulatory Visit: Payer: Self-pay

## 2021-04-15 ENCOUNTER — Encounter: Payer: Self-pay | Admitting: Neurology

## 2021-04-15 VITALS — BP 122/81 | HR 71 | Ht 64.0 in | Wt 217.0 lb

## 2021-04-15 DIAGNOSIS — G43109 Migraine with aura, not intractable, without status migrainosus: Secondary | ICD-10-CM

## 2021-04-15 MED ORDER — TOPIRAMATE 25 MG PO TABS
75.0000 mg | ORAL_TABLET | Freq: Every day | ORAL | 3 refills | Status: DC
  Filled 2021-04-15 – 2021-05-19 (×2): qty 270, 90d supply, fill #0
  Filled 2021-08-21: qty 270, 90d supply, fill #1
  Filled 2021-11-17: qty 270, 90d supply, fill #2
  Filled 2022-02-16: qty 270, 90d supply, fill #3

## 2021-04-15 NOTE — Patient Instructions (Signed)
You look great today! ?Continue current Topamax  ?Call for any worsening headaches ?See you back in 1 year ?Dr. Billey Gosling will be your primary neurologist  ?

## 2021-04-16 NOTE — Progress Notes (Signed)
[facility] PREP Weekly Session ? ?Patient Details  ?Name: Kerrigan Gombos ?MRN: [id number] ?Date of Birth: Nov 11, 1965 ?Age: 56 y.o. ?PCP: Elby Showers, MD ? ?There were no vitals filed for this visit. ? ? [facility] Weekly seesion - 04/16/21 0900   ? ?  ? [facility]PREP[facility] Location  ? [facility] [place]   ?  ? Weekly Session  ? Topic Discussed Goal setting and welcome to the program   Scale of perceived exertion, fit testing done  ? Classes attended to date 1   ? ?  ?  ? ?  ? ? ?Barnett Hatter ?04/16/2021, 9:18 AM ? ? ?

## 2021-04-22 ENCOUNTER — Other Ambulatory Visit (HOSPITAL_COMMUNITY): Payer: Self-pay

## 2021-04-23 NOTE — Progress Notes (Signed)
[facility] PRE[facility] Weekly Session ? ?Patient Details  ?Name: Jodi Bates ?MRN: [id number] ?Date of Birth: Aug 17, 1965 ?Age: 56 y.o. ?PCP: Elby Showers, MD ? ?Vitals:  ? 04/22/21 1830  ?Weight: 216 lb 6.4 oz (98.2 kg)  ? ? ? [facility] Weekly seesion - 04/23/21 0900   ? ?  ? [facility]  ? [facility] [place]   ?  ? Weekly Session  ? Topic Discussed Importance of resistance training;Other ways to be active   Set consistent wake/sleep times  ? Minutes exercised this week 131 minutes   ? Classes attended to date 3   ? ?  ?  ? ?  ? ?Class held on 04/22/21 ?Barnett Hatter ?04/23/2021, 9:50 AM ? ? ?

## 2021-04-29 ENCOUNTER — Ambulatory Visit (INDEPENDENT_AMBULATORY_CARE_PROVIDER_SITE_OTHER): Payer: 59 | Admitting: Allergy and Immunology

## 2021-04-29 ENCOUNTER — Other Ambulatory Visit (HOSPITAL_COMMUNITY): Payer: Self-pay

## 2021-04-29 ENCOUNTER — Encounter: Payer: Self-pay | Admitting: Allergy and Immunology

## 2021-04-29 VITALS — BP 112/84 | HR 80 | Temp 98.1°F | Resp 16 | Ht 64.0 in | Wt 215.6 lb

## 2021-04-29 DIAGNOSIS — J4540 Moderate persistent asthma, uncomplicated: Secondary | ICD-10-CM | POA: Diagnosis not present

## 2021-04-29 DIAGNOSIS — J3089 Other allergic rhinitis: Secondary | ICD-10-CM

## 2021-04-29 MED ORDER — BREO ELLIPTA 100-25 MCG/ACT IN AEPB
1.0000 | INHALATION_SPRAY | RESPIRATORY_TRACT | 5 refills | Status: DC
  Filled 2021-04-29: qty 60, 30d supply, fill #0
  Filled 2022-02-16: qty 60, 30d supply, fill #1

## 2021-04-29 MED ORDER — VENTOLIN HFA 108 (90 BASE) MCG/ACT IN AERS
2.0000 | INHALATION_SPRAY | RESPIRATORY_TRACT | 1 refills | Status: DC | PRN
Start: 2021-04-29 — End: 2021-04-29
  Filled 2021-04-29: qty 18, 25d supply, fill #0

## 2021-04-29 MED ORDER — FLUTICASONE PROPIONATE 50 MCG/ACT NA SUSP
1.0000 | NASAL | 5 refills | Status: DC
  Filled 2021-04-29: qty 16, 30d supply, fill #0

## 2021-04-29 MED ORDER — FEXOFENADINE HCL 180 MG PO TABS
180.0000 mg | ORAL_TABLET | Freq: Two times a day (BID) | ORAL | 5 refills | Status: DC | PRN
  Filled 2021-04-29: qty 60, 30d supply, fill #0

## 2021-04-29 MED ORDER — ALBUTEROL SULFATE HFA 108 (90 BASE) MCG/ACT IN AERS
2.0000 | INHALATION_SPRAY | RESPIRATORY_TRACT | 1 refills | Status: DC | PRN
Start: 2021-04-29 — End: 2023-06-22
  Filled 2021-04-29: qty 18, 17d supply, fill #0
  Filled 2022-04-21: qty 6.7, 25d supply, fill #1

## 2021-04-29 NOTE — Patient Instructions (Signed)
?  1. Continue to Treat and prevent inflammation: ? ? A. Flonase 1-2 sprays each nostril 1-7 times per week depending on disease activity ? B. Breo 100 - 1 inhalation 1- 7 times per week depending on disease activity ? ?2. If needed: ? ? A. Ventolin HFA 2 puffs every 4-6 hours ? B. OTC antihistamine - Claritin/Allegra/Zyrtec ? ?3.  Return to clinic in 12 months or earlier if problem ? ? ? ? ?

## 2021-04-29 NOTE — Progress Notes (Signed)
? ?[facility] ? ? ?Follow-up Note ? ?Referring Provider: Elby Showers, MD ?Primary Provider: Elby Showers, MD ?Date of Office Visit: 04/29/2021 ? ?Subjective:  ? ?Jodi Bates (DOB: Jan 14, 1966) is a 56 y.o. female who returns to the [facility] on 04/29/2021 in re-evaluation of the following: ? ?HPI: Jodi Bates returns to this clinic in evaluation of asthma and allergic rhinoconjunctivitis and LPR.  Her last visit to this clinic was 23 April 2020. ? ?She had absolutely no issues with her asthma at all.  She has not required a systemic steroid to treat an exacerbation and she rarely uses a short acting bronchodilator and she can exert herself without any problem while she intermittently uses a combination inhaler. ? ?Likewise, her nose has really done very well and she has not required an antibiotic to treat an episode of sinusitis.  She rarely uses any nasal steroid at this point in time. ? ?She has no problems with reflux and does not require any antireflux therapy. ? ?She does have a history of migraine associated with vertiginous episodes and she is now on Topamax as a preventative and has a triptan to use during her episodes. ? ?She apparently contracted COVID infection manifested as chest pain September 2022 without any long-term sequela. ? ?Allergies as of 04/29/2021   ? ?   Reactions  ? Adhesive [tape] Itching, Other (See Comments)  ? Reaction:  Blisters   ? Latex Itching, Rash  ? Soap Other (See Comments)  ? Soaps that contain any kind of fragrance triggers an asthma attack.    ? Other Cough  ? Fragrances  ? ?  ? ?  ?Medication List  ? ? ?albuterol (2.5 MG/3ML) 0.083% nebulizer solution ?Commonly known as: PROVENTIL ?Take 3 mLs by nebulization every 6 (six) hours as needed for wheezing or shortness of breath. ?  ?Ventolin HFA 108 (90 Base) MCG/ACT inhaler ?Generic drug: albuterol ?Inhale 2 puffs into the lungs every 4 (four) hours as  needed for wheezing or shortness of breath. ?  ?ALPRAZolam 0.5 MG tablet ?Commonly known as: Xanax ?Take 1 tablet (0.5 mg total) by mouth 3 (three) times daily. ?  ?amLODipine 10 MG tablet ?Commonly known as: NORVASC ?Take 1 tablet (10 mg total) by mouth daily. ?  ?Breo Ellipta 100-25 MCG/ACT Aepb ?Generic drug: fluticasone furoate-vilanterol ?Inhale 1 puff into the lungs 1 to 7 times per week as needed ?  ?escitalopram 20 MG tablet ?Commonly known as: LEXAPRO ?Take 1 tablet (20 mg total) by mouth daily. ?  ?fluticasone 50 MCG/ACT nasal spray ?Commonly known as: FLONASE ?Place 1-2 sprays into both nostrils 1 to 7 times per week as needed ?  ?GINGER PO ?Take 1 tablet by mouth in the morning and at bedtime. chewable ?  ?latanoprost 0.005 % ophthalmic solution ?Commonly known as: XALATAN ?Instill 1 drop into both eyes at bedtime ?  ?loratadine 10 MG tablet ?Commonly known as: CLARITIN ?TAKE 1 TABLET (10 MG TOTAL) BY MOUTH DAILY. ?  ?losartan 100 MG tablet ?Commonly known as: COZAAR ?TAKE 1 TABLET (100 MG TOTAL) BY MOUTH DAILY. ?  ?spironolactone 25 MG tablet ?Commonly known as: Aldactone ?Take 1 tablet (25 mg total) by mouth daily. ?  ?SUMAtriptan 25 MG tablet ?Commonly known as: Imitrex ?Take 1 tablet (25 mg total) by mouth every 2 (two) hours as needed for migraine. May repeat in 2 hours if headache persists or recurs. ?  ?topiramate 25  MG tablet ?Commonly known as: TOPAMAX ?Take 3 tablets (75 mg total) by mouth at bedtime. ?  ?Wegovy 1 MG/0.5ML Soaj ?Generic drug: Semaglutide-Weight Management ?Inject 1 mg into the skin once a week. ?  ? ?Past Medical History:  ?Diagnosis Date  ? Allergy   ? Anxiety   ? Asthma   ? prn inhalers  ? Essential hypertension 03/22/2009  ? Qualifier: Diagnosis of  By: Olevia Perches, MD, Glenetta Hew   ? GERD (gastroesophageal reflux disease)   ? 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 recurrent gluteal cleft infections  ? Headache(784.0)   ? tension  ? Hypertension   ? under control with meds., has  been on med. > 5 yr.  ? Iron deficiency anemia   ? no current problems or meds.  ? Migraine headache with aura 04/09/2020  ? Morbid obesity ([place]) 11/10/2020  ? Prediabetes 02/14/2021  ? ? ?Past Surgical History:  ?Procedure Laterality Date  ? BREAST CYST EXCISION    ? BREAST EXCISIONAL BIOPSY Left   ? BREAST SURGERY    ?  lt breast/ benign cyst  ? CYST EXCISION    ? back of head  ? HYSTEROSCOPY WITH D & C  06/25/2004  ? INCISION AND DRAINAGE PERIRECTAL ABSCESS N/A 02/10/2013  ? Procedure: IRRIGATION AND DEBRIDEMENT PERIRECTAL ABSCESS;  Surgeon: Gwenyth Ober, MD;  Location: [facility];  Service: General;  Laterality: N/A;  ? LAPAROSCOPIC ENDOMETRIOSIS FULGURATION    ? PILONIDAL CYST EXCISION N/A 04/28/2012  ? Procedure: exam under anesthesia and exicision of pilonidal;  Surgeon: Gwenyth Ober, MD;  Location: [place];  Service: General;  Laterality: N/A;  ? POLYPECTOMY    ? uterine  ? TUBAL LIGATION    ? ? ?Review of systems negative except as noted in HPI / PMHx or noted below: ? ?Review of Systems  ?Constitutional: Negative.   ?HENT: Negative.    ?Eyes: Negative.   ?Respiratory: Negative.    ?Cardiovascular: Negative.   ?Gastrointestinal: Negative.   ?Genitourinary: Negative.   ?Musculoskeletal: Negative.   ?Skin: Negative.   ?Neurological: Negative.   ?Endo/Heme/Allergies: Negative.   ?Psychiatric/Behavioral: Negative.    ? ? ?Objective:  ? ?Vitals:  ? 04/29/21 1045  ?BP: 112/84  ?Pulse: 80  ?Resp: 16  ?Temp: 98.1 ?F (36.7 ?C)  ?SpO2: 98%  ? ?Height: '5\' 4"'$  (162.6 cm)  ?Weight: 215 lb 9.6 oz (97.8 kg)  ? ?Physical Exam ?Constitutional:   ?   Appearance: She is not diaphoretic.  ?HENT:  ?   Head: Normocephalic.  ?   Right Ear: Tympanic membrane, ear canal and external ear normal.  ?   Left Ear: Tympanic membrane, ear canal and external ear normal.  ?   Nose: Nose normal. No mucosal edema or rhinorrhea.  ?   Mouth/Throat:  ?   Pharynx: Uvula midline. No oropharyngeal exudate.  ?Eyes:  ?   Conjunctiva/sclera:  Conjunctivae normal.  ?Neck:  ?   Thyroid: No thyromegaly.  ?   Trachea: Trachea normal. No tracheal tenderness or tracheal deviation.  ?Cardiovascular:  ?   Rate and Rhythm: Normal rate and regular rhythm.  ?   Heart sounds: Normal heart sounds, S1 normal and S2 normal. No murmur heard. ?Pulmonary:  ?   Effort: No respiratory distress.  ?   Breath sounds: Normal breath sounds. No stridor. No wheezing or rales.  ?Lymphadenopathy:  ?   Head:  ?   Right side of head: No tonsillar adenopathy.  ?  Left side of head: No tonsillar adenopathy.  ?   Cervical: No cervical adenopathy.  ?Skin: ?   Findings: No erythema or rash.  ?   Nails: There is no clubbing.  ?Neurological:  ?   Mental Status: She is alert.  ? ? ?Diagnostics:  ?  ?Spirometry was performed and demonstrated an FEV1 of 1.56 at 69 % of predicted. ? ?Assessment and Plan:  ? ?1. Asthma, moderate persistent, well-controlled   ?2. Other allergic rhinitis   ? ?1. Continue to Treat and prevent inflammation: ? ? A. Flonase 1-2 sprays each nostril 1-7 times per week depending on disease activity ? B. Breo 100 - 1 inhalation 1- 7 times per week depending on disease activity ? ?2. If needed: ? ? A. Ventolin HFA 2 puffs every 4-6 hours ? B. OTC antihistamine - Claritin/Allegra/Zyrtec ? ?3.  Return to clinic in 12 months or earlier if problem ? ?Jodi Bates is really doing very well and she has a good understanding of her disease state and how her medications work and appropriate dosing of her medications depending on disease activity.  Assuming she continues to do well with the plan noted above I will see her back in this clinic in 12 months or earlier if there is a problem. ? ?Allena Katz, MD ?Allergy / Immunology ?[place] ?

## 2021-04-30 ENCOUNTER — Encounter: Payer: Self-pay | Admitting: Allergy and Immunology

## 2021-04-30 ENCOUNTER — Encounter: Payer: Self-pay | Admitting: Internal Medicine

## 2021-04-30 NOTE — Progress Notes (Signed)
[facility] PREP Weekly Session ? ?Patient Details  ?Name: Jodi Bates ?MRN: [id number] ?Date of Birth: [birth year] ?Age: 56 y.o. ?PCP: Elby Showers, MD ? ?Vitals:  ? 04/29/21 1830  ?Weight: 216 lb 3.2 oz (98.1 kg)  ? ? ? [facility] Weekly seesion - 04/30/21 1300   ? ?  ? [facility]PREP" Location  ? [facility]PREP[facility] Location [place]   ?  ? Weekly Session  ? Topic Discussed Stress management and problem solving   meditation  ? Minutes exercised this week 235 minutes   ? Classes attended to date 5   ? ?  ?  ? ?  ? ? ?Barnett Hatter ?04/30/2021, 1:22 PM ? ? ?

## 2021-05-07 NOTE — Progress Notes (Signed)
[facility] PREP Weekly Session ? ?Patient Details  ?Name: Jodi Bates ?MRN: [id number] ?Date of Birth: 1965-02-08 ?Age: 56 y.o. ?PCP: Elby Showers, MD ? ?Vitals:  ? 05/06/21 1830  ?Weight: 214 lb 9.6 oz (97.3 kg)  ? ? ? [facility] Weekly seesion - 05/07/21 0900   ? ?  ? [facility]PREP[facility] Location  ? [facility]PREP[facility] [place]   ?  ? Weekly Session  ? Topic Discussed Healthy eating tips   ? Minutes exercised this week 384 minutes   ? Classes attended to date 6   ? ?  ?  ? ?  ? ? ?Barnett Hatter ?05/07/2021, 9:27 AM ? ? ?

## 2021-05-14 NOTE — Progress Notes (Signed)
[facility] PREP Weekly Session ? ?Patient Details  ?Name: Jodi Bates ?MRN: [id number] ?Date of Birth: Feb 24, 1965 ?Age: 56 y.o. ?PCP: Elby Showers, MD ? ?Vitals:  ? 05/13/21 1830  ?Weight: 213 lb 9.6 oz (96.9 kg)  ? ? ? [facility] Weekly seesion - 05/14/21 1200   ? ?  ? [facility]  ? [facility]PREP[facility] [place]   ?  ? Weekly Session  ? Topic Discussed Health habits   ? Minutes exercised this week 385 minutes   ? Classes attended to date 48   ? ?  ?  ? ?  ? ? ?Barnett Hatter ?05/14/2021, 12:15 PM ? ? ?

## 2021-05-20 ENCOUNTER — Other Ambulatory Visit (HOSPITAL_COMMUNITY): Payer: Self-pay

## 2021-05-22 NOTE — Progress Notes (Signed)
[facility] PREP Weekly Session ? ?Patient Details  ?Name: Jodi Bates ?MRN: [id number] ?Date of Birth: 01/30/1965 ?Age: 56 y.o. ?PCP: Elby Showers, MD ? ?Vitals:  ? 05/20/21 1830  ?Weight: 211 lb 12.8 oz (96.1 kg)  ? ? ? [facility] Weekly seesion - 05/22/21 1700   ? ?  ? [facility]PREP[facility] Location  ? [facility]PREP[facility] [place]   ?  ? Weekly Session  ? Topic Discussed Restaurant Eating   Salt and sugar demo  ? Minutes exercised this week 621 minutes   ? Classes attended to date 27   ? ?  ?  ? ?  ? ? ?Barnett Hatter ?05/22/2021, 5:24 PM ? ? ?

## 2021-05-27 ENCOUNTER — Other Ambulatory Visit: Payer: Self-pay | Admitting: Cardiovascular Disease

## 2021-05-27 ENCOUNTER — Other Ambulatory Visit (HOSPITAL_COMMUNITY): Payer: Self-pay

## 2021-05-27 DIAGNOSIS — Z13820 Encounter for screening for osteoporosis: Secondary | ICD-10-CM | POA: Diagnosis not present

## 2021-05-27 MED ORDER — LOSARTAN POTASSIUM 100 MG PO TABS
100.0000 mg | ORAL_TABLET | Freq: Every day | ORAL | 2 refills | Status: DC
  Filled 2021-05-27: qty 90, 90d supply, fill #0
  Filled 2021-08-18: qty 90, 90d supply, fill #1
  Filled 2021-11-21: qty 90, 90d supply, fill #2

## 2021-05-27 NOTE — Telephone Encounter (Signed)
Rx(s) sent to pharmacy electronically.  

## 2021-05-28 ENCOUNTER — Other Ambulatory Visit (HOSPITAL_COMMUNITY): Payer: Self-pay

## 2021-05-28 ENCOUNTER — Telehealth: Payer: Self-pay | Admitting: Internal Medicine

## 2021-05-28 MED ORDER — ALPRAZOLAM 0.5 MG PO TABS
0.5000 mg | ORAL_TABLET | Freq: Two times a day (BID) | ORAL | 0 refills | Status: DC | PRN
  Filled 2021-05-28: qty 60, 30d supply, fill #0

## 2021-05-28 NOTE — Telephone Encounter (Signed)
Diamond Nickel ?[phone] ? ?Jodi Bates called to say she needs a refill on below medication. I let her know she would need an office visit because she has not been here in over a year. Do you want me to schedule a visit for next week? She is not completely out yet. ? ?Last Office visit 03/29/2020 for vertigo ? ?ALPRAZolam (XANAX) 0.5 MG tablet ?

## 2021-05-29 ENCOUNTER — Other Ambulatory Visit: Payer: 59

## 2021-05-29 DIAGNOSIS — R7303 Prediabetes: Secondary | ICD-10-CM | POA: Diagnosis not present

## 2021-05-29 DIAGNOSIS — I10 Essential (primary) hypertension: Secondary | ICD-10-CM | POA: Diagnosis not present

## 2021-05-29 NOTE — Progress Notes (Signed)
[facility] PREP Weekly Session ? ?Patient Details  ?Name: Jodi Bates ?MRN: [id number] ?Date of Birth: 1965-10-30 ?Age: 56 y.o. ?PCP: Elby Showers, MD ? ?Vitals:  ? 05/27/21 1830  ?Weight: 210 lb 12.8 oz (95.6 kg)  ? ? ? [facility] Weekly seesion - 05/29/21 1600   ? ?  ? [facility]  ? [facility] [place]   ?  ? Weekly Session  ? Topic Discussed Other   mindset, goal setting  ? Minutes exercised this week 636 minutes   ? Classes attended to date 43   ? ?  ?  ? ?  ? ? ?Barnett Hatter ?05/29/2021, 4:42 PM ? ? ?

## 2021-05-29 NOTE — Telephone Encounter (Signed)
Medication was filled by another doctor, she will call back if she needs to come in. ?

## 2021-06-02 LAB — COMPREHENSIVE METABOLIC PANEL
ALT: 14 IU/L (ref 0–32)
AST: 16 IU/L (ref 0–40)
Albumin/Globulin Ratio: 2 (ref 1.2–2.2)
Albumin: 4.3 g/dL (ref 3.8–4.9)
Alkaline Phosphatase: 74 IU/L (ref 44–121)
BUN/Creatinine Ratio: 16 (ref 9–23)
BUN: 15 mg/dL (ref 6–24)
Bilirubin Total: 0.3 mg/dL (ref 0.0–1.2)
CO2: 24 mmol/L (ref 20–29)
Calcium: 8.9 mg/dL (ref 8.7–10.2)
Chloride: 106 mmol/L (ref 96–106)
Creatinine, Ser: 0.96 mg/dL (ref 0.57–1.00)
Globulin, Total: 2.1 g/dL (ref 1.5–4.5)
Glucose: 100 mg/dL — ABNORMAL HIGH (ref 70–99)
Potassium: 4.1 mmol/L (ref 3.5–5.2)
Sodium: 140 mmol/L (ref 134–144)
Total Protein: 6.4 g/dL (ref 6.0–8.5)
eGFR: 70 mL/min/{1.73_m2} (ref >59)

## 2021-06-02 LAB — LIPID PANEL
Chol/HDL Ratio: 3.6 ratio (ref 0.0–4.4)
Cholesterol, Total: 161 mg/dL (ref 100–199)
HDL: 45 mg/dL (ref >39)
LDL Chol Calc (NIH): 99 mg/dL (ref 0–99)
Triglycerides: 93 mg/dL (ref 0–149)
VLDL Cholesterol Cal: 17 mg/dL (ref 5–40)

## 2021-06-02 LAB — HEMOGLOBIN A1C
Est. average glucose Bld gHb Est-mCnc: 108 mg/dL
Hgb A1c MFr Bld: 5.4 % (ref 4.8–5.6)

## 2021-06-02 NOTE — Progress Notes (Signed)
? ? ?Cardiology Office Note ? ? ?Date:  06/03/2021  ? ?ID:  Jodi Bates, DOB 06-16-1965, MRN [id number] ? ?PCP:  Elby Showers, MD  ?Cardiologist:   [name], MD  ? ?No chief complaint on file. ? ?  ?History of Present Illness: ?Jodi Bates is a 56 y.o. female with aortic atherosclerosis, asthma and hypertension who is being seen today for follow up.  She was seen 05/2016 for bradycardia and near syncope as well as hypertension.  Jodi Bates was seen in the emergency department 01/2016 for episode of near-syncope. 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 She was seen in the emergency department and her heart rate was in the 40s. At the time she was taking nebivolol. This was discontinued and she has not had any recurrent episodes.  She has struggled with poorly-controlled hypertension, so furosemide was switched to HCTZ.  Her blood pressure was above goal so amlodipine was increased to 10 mg daily. ? ?Jodi Bates was seen in the ED 09/2020 for chest pain. Cardiac enzymes were negative.  She was diagnosed with COVID-19.  She was feeling better without chest pain, but had lingering cough and some body aches.  She has a longstanding history of vertigo.  She saw a neurologist and they thought it was due to migraines with aura.  They prescribed sumatriptan but it makes her sleepy.  She noted that she had allergies and congestion.  Her exercise was limited because she was afraid of a vertigo attack.  She was able to lose 40 lb by dieting but gained some back.  ? ?She was seen in the ED 01/2021 with abdominal pain.  She had a small amount of free fluid in the pelvis.  She was also found to have a new sclerotic lesion in the left sacrum that was nonspecific and a outpatient bone scan was recommended.  She was noted to have uterine fibroids.  She also had mild calcification of the aorta.  In retrospect she thought it may have been a  piece of fried chicken that she had eaten. In general she doesn't eat fried food and she thought that this upset her stomach.   ? ?At her last appointment she noted her sleep had been disturbed. She was waking up before her alarm and felt fatigued after work. She endorsed snoring. She was working with a Engineer, maintenance on diet and exercise. Also she had an episode of dizziness she thought was vertigo, with an associated blood pressure of 160/106. At home her blood pressures had been 100-140s/80-90s. Her blood pressures were labile; her systolics were generally controlled but her diastolics were high. She was referred to the PREP program and started on Wegovy. As of 02/2021 her potassium was low, so HCTZ was switched to spironolactone 25 mg daily. ? ?Today, she reports ongoing issues with vertigo. Otherwise she is feeling okay. In clinic today her blood pressure is 102/80, which she notes is low for her. At home her blood pressure averages 122/80s, sometimes in the high 58K for her diastolic reading. She has been enjoying the PREP program. For the most part she feels fine with exercise. She feels a little hesitant while walking on the track due to her vertigo. No issues while she is taking Wegovy. However, she has been off of Wegovy for about 1 week due to running out. She has not received her next higher dose. She denies any palpitations,  chest pain, shortness of breath, or peripheral edema. No headaches, syncope, orthopnea, or PND. ? ? ?Past Medical History:  ?Diagnosis Date  ? Allergy   ? Anxiety   ? Asthma   ? prn inhalers  ? Essential hypertension 03/22/2009  ? Qualifier: Diagnosis of  By: Olevia Perches, MD, Glenetta Hew   ? GERD (gastroesophageal reflux disease)   ? 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 recurrent gluteal cleft infections  ? Headache(784.0)   ? tension  ? Hypertension   ? under control with meds., has been on med. > 5 yr.  ? Iron deficiency anemia   ? no current problems or meds.  ? Migraine headache with  aura 04/09/2020  ? Morbid obesity ([place]) 11/10/2020  ? Prediabetes 02/14/2021  ? ? ?Past Surgical History:  ?Procedure Laterality Date  ? BREAST CYST EXCISION    ? BREAST EXCISIONAL BIOPSY Left   ? BREAST SURGERY    ?  lt breast/ benign cyst  ? CYST EXCISION    ? back of head  ? HYSTEROSCOPY WITH D & C  06/25/2004  ? INCISION AND DRAINAGE PERIRECTAL ABSCESS N/A 02/10/2013  ? Procedure: IRRIGATION AND DEBRIDEMENT PERIRECTAL ABSCESS;  Surgeon: Gwenyth Ober, MD;  Location: [facility];  Service: General;  Laterality: N/A;  ? LAPAROSCOPIC ENDOMETRIOSIS FULGURATION    ? PILONIDAL CYST EXCISION N/A 04/28/2012  ? Procedure: exam under anesthesia and exicision of pilonidal;  Surgeon: Gwenyth Ober, MD;  Location: [facility];  Service: General;  Laterality: N/A;  ? POLYPECTOMY    ? uterine  ? TUBAL LIGATION    ? ? ? ?Current Outpatient Medications  ?Medication Sig Dispense Refill  ? albuterol (PROVENTIL) (2.5 MG/3ML) 0.083% nebulizer solution Take 3 mLs by nebulization every 6 (six) hours as needed for wheezing or shortness of breath. 75 mL 1  ? albuterol (VENTOLIN HFA) 108 (90 Base) MCG/ACT inhaler Inhale 2 puffs into the lungs every 4 (four) hours as needed for wheezing or shortness of breath. 18 g 1  ? ALPRAZolam (XANAX) 0.5 MG tablet Take 1 tablet (0.5 mg total) by mouth 2 (two) times daily as needed. 60 tablet 0  ? amLODipine (NORVASC) 10 MG tablet Take 1 tablet (10 mg total) by mouth daily. 90 tablet 3  ? BREO ELLIPTA 100-25 MCG/ACT AEPB Inhale 1 puff into the lungs 1 to 7 times per week as needed 60 each 5  ? escitalopram (LEXAPRO) 20 MG tablet Take 1 tablet (20 mg total) by mouth daily. (Patient taking differently: Take 20 mg by mouth at bedtime.) 90 tablet 4  ? fexofenadine (ALLEGRA) 180 MG tablet Take 1 tablet (180 mg total) by mouth 2 (two) times daily as needed for allergies or rhinitis (Can take an extra dose during flare ups). 60 tablet 5  ? fluticasone (FLONASE) 50 MCG/ACT nasal spray Place 1-2 sprays into  both nostrils 1 to 7 times per week as needed 16 g 5  ? Ginger, Zingiber officinalis, (GINGER PO) Take 1 tablet by mouth in the morning and at bedtime. chewable    ? latanoprost (XALATAN) 0.005 % ophthalmic solution Instill 1 drop into both eyes at bedtime 2.5 mL 11  ? loratadine (CLARITIN) 10 MG tablet TAKE 1 TABLET (10 MG TOTAL) BY MOUTH DAILY. (Patient taking differently: Take 10 mg by mouth daily as needed for allergies.) 30 tablet 5  ? losartan (COZAAR) 100 MG tablet Take 1 tablet (100 mg total) by mouth daily. 90 tablet 2  ? spironolactone (  ALDACTONE) 25 MG tablet Take 1 tablet (25 mg total) by mouth daily. 90 tablet 1  ? SUMAtriptan (IMITREX) 25 MG tablet Take 1 tablet (25 mg total) by mouth every 2 (two) hours as needed for migraine. May repeat in 2 hours if headache persists or recurs. 10 tablet 5  ? topiramate (TOPAMAX) 25 MG tablet Take 3 tablets (75 mg total) by mouth at bedtime. 270 tablet 3  ? Semaglutide-Weight Management (WEGOVY) 1 MG/0.5ML SOAJ Inject 1 mg into the skin once a week. 2 mL 11  ? ?No current facility-administered medications for this visit.  ? ? ?Allergies:   Adhesive [tape], Latex, Soap, Hydrochlorothiazide, and Other  ? ? ?Social History:  The patient  reports that she has never smoked. She has never used smokeless tobacco. She reports that she does not drink alcohol and does not use drugs.  ? ?Family History:  The patient's family history includes Allergic rhinitis in her mother and son; Asthma in her brother and son; Breast cancer in her maternal grandmother; Cancer in her father and maternal grandmother; Diabetes in her brother and mother; Esophageal cancer in her father; Heart failure in her mother; Hypertension in her mother; Pancreatic cancer in her brother; Sleep apnea in her brother.  ? ? ?ROS:   ?Please see the history of present illness. ?(+) Vertigo ?(+) Anxiety ?All other systems are reviewed and negative.  ? ? ?PHYSICAL EXAM: ?VS:  BP 102/80 (BP Location: Left Arm,  Patient Position: Sitting, Cuff Size: Large)   Pulse 83   Ht '5\' 4"'$  (1.626 m)   Wt 211 lb 11.2 oz (96 kg)   SpO2 94%   BMI 36.34 kg/m?  , BMI Body mass index is 36.34 kg/m?. ?GENERAL:  Well appearing ?HEENT: [place]

## 2021-06-03 ENCOUNTER — Encounter (HOSPITAL_BASED_OUTPATIENT_CLINIC_OR_DEPARTMENT_OTHER): Payer: Self-pay | Admitting: Cardiovascular Disease

## 2021-06-03 ENCOUNTER — Other Ambulatory Visit (HOSPITAL_BASED_OUTPATIENT_CLINIC_OR_DEPARTMENT_OTHER): Payer: Self-pay | Admitting: Cardiovascular Disease

## 2021-06-03 ENCOUNTER — Telehealth (HOSPITAL_BASED_OUTPATIENT_CLINIC_OR_DEPARTMENT_OTHER): Payer: Self-pay | Admitting: Cardiovascular Disease

## 2021-06-03 ENCOUNTER — Other Ambulatory Visit: Payer: Self-pay | Admitting: Cardiovascular Disease

## 2021-06-03 ENCOUNTER — Other Ambulatory Visit (HOSPITAL_COMMUNITY): Payer: Self-pay

## 2021-06-03 ENCOUNTER — Ambulatory Visit (HOSPITAL_BASED_OUTPATIENT_CLINIC_OR_DEPARTMENT_OTHER): Payer: 59 | Admitting: Cardiovascular Disease

## 2021-06-03 DIAGNOSIS — R7303 Prediabetes: Secondary | ICD-10-CM

## 2021-06-03 DIAGNOSIS — Z6837 Body mass index (BMI) 37.0-37.9, adult: Secondary | ICD-10-CM | POA: Diagnosis not present

## 2021-06-03 DIAGNOSIS — I10 Essential (primary) hypertension: Secondary | ICD-10-CM

## 2021-06-03 DIAGNOSIS — Z1231 Encounter for screening mammogram for malignant neoplasm of breast: Secondary | ICD-10-CM

## 2021-06-03 MED ORDER — WEGOVY 1 MG/0.5ML ~~LOC~~ SOAJ
1.0000 mg | SUBCUTANEOUS | 11 refills | Status: DC
  Filled 2021-06-03 – 2021-06-05 (×2): qty 2, 28d supply, fill #0
  Filled 2021-07-01 (×2): qty 2, 28d supply, fill #1
  Filled 2021-07-26 (×2): qty 2, 28d supply, fill #2
  Filled 2021-08-21: qty 2, 28d supply, fill #3
  Filled 2021-08-23: qty 2, 28d supply, fill #0
  Filled 2021-09-18: qty 2, 28d supply, fill #1
  Filled 2021-10-18 (×2): qty 2, 28d supply, fill #2
  Filled 2021-10-20: qty 2, 28d supply, fill #0
  Filled 2021-11-13: qty 2, 28d supply, fill #1
  Filled 2021-12-11: qty 2, 28d supply, fill #2
  Filled 2022-01-08 – 2022-01-09 (×2): qty 2, 28d supply, fill #3
  Filled 2022-02-05: qty 2, 28d supply, fill #4
  Filled 2022-03-05: qty 2, 28d supply, fill #5
  Filled 2022-04-01: qty 2, 28d supply, fill #6

## 2021-06-03 NOTE — Patient Instructions (Signed)
Medication Instructions:  ?Your physician recommends that you continue on your current medications as directed. Please refer to the Current Medication list given to you today.  ? ?*If you need a refill on your cardiac medications before your next appointment, please call your pharmacy* ? ?Lab Work: ?FASTING LP/CMET IN 6 MONTHS ? ?If you have labs (blood work) drawn today and your tests are completely normal, you will receive your results only by: ?MyChart Message (if you have MyChart) OR ?A paper copy in the mail ?If you have any lab test that is abnormal or we need to change your treatment, we will call you to review the results. ? ?Testing/Procedures: ?NONE ? ?Follow-Up: ?At [facility], you and your health needs are our priority.  As part of our continuing mission to provide you with exceptional heart care, we have created designated Provider Care Teams.  These Care Teams include your primary Cardiologist (physician) and Advanced Practice Providers (APPs -  Physician Assistants and Nurse Practitioners) who all work together to provide you with the care you need, when you need it. ? ?We recommend signing up for the patient portal called "MyChart".  Sign up information is provided on this After Visit Summary.  MyChart is used to connect with patients for Virtual Visits (Telemedicine).  Patients are able to view lab/test results, encounter notes, upcoming appointments, etc.  Non-urgent messages can be sent to your provider as well.   ?To learn more about what you can do with MyChart, go to [email or web address].   ? ?Your next appointment:   ?12 month(s) ? ?The format for your next appointment:   ?In Person ? ?Provider:   ?[name], MD ?  ? ? ? ? ? ? ? ? ? ? ? ?

## 2021-06-03 NOTE — Telephone Encounter (Signed)
Pa started for Jodi Bates (Key: [id number]) - [phone] ?Wegovy '1MG'$ /0.5ML auto-injectors ?Status: PA Request ?Created: May 9th, 2023 [phone] ?Sent: May 9th, 2023 ?

## 2021-06-03 NOTE — Telephone Encounter (Signed)
Patient called to say that the pharmacy says she need a authorization for the medication. Please advise  ?

## 2021-06-03 NOTE — Assessment & Plan Note (Addendum)
Jodi Bates is doing well on Wegovy.  She has been out of it for a week after starting her dose adjustment pack.  She will pick up the 1 mg prescription today.  Continue with exercise to the prep program and then continue to get at least 150 minutes of exercise per week after it concludes. ?

## 2021-06-03 NOTE — Assessment & Plan Note (Signed)
She wasBlood pressure is well-controlled.  She notes that it has been in the 120s over 80s at home.  Elevating her current dose, even though her blood pressure is a little low today.  We will continue with amlodipine, losartan, and spironolactone.  Potassium improved after switching HCTZ to spironolactone. ?

## 2021-06-03 NOTE — Assessment & Plan Note (Signed)
Hemoglobin A1c improving with diet and exercise.  It was down to 5.4% when rechecked. ?

## 2021-06-04 NOTE — Progress Notes (Signed)
[facility] PREP Weekly Session ? ?Patient Details  ?Name: Jodi Bates ?MRN: [id number] ?Date of Birth: 10-09-1965 ?Age: 56 y.o. ?PCP: Elby Showers, MD ? ?Vitals:  ? 06/03/21 1830  ?Weight: 211 lb 3.2 oz (95.8 kg)  ? ? ? [facility] Weekly seesion - 06/04/21 1500   ? ?  ? [facility] "PREP" Location  ? [facility]PREP[facility] [place]   ?  ? Weekly Session  ? Topic Discussed Other   portion size, reading labels  ? Minutes exercised this week 511 minutes   ? Classes attended to date 65   ? ?  ?  ? ?  ? ? ?Barnett Hatter ?06/04/2021, 3:52 PM ? ? ?

## 2021-06-04 NOTE — Telephone Encounter (Signed)
Patient aware PA pending  ?She was asking how long being off would she need to change dose ?Per Pharm D 2 weeks should be ok, over that needs to go back to lower dose. 6 weeks needs to go back to 0.25 mg dose  ?

## 2021-06-05 ENCOUNTER — Encounter (HOSPITAL_BASED_OUTPATIENT_CLINIC_OR_DEPARTMENT_OTHER): Payer: Self-pay | Admitting: *Deleted

## 2021-06-05 ENCOUNTER — Other Ambulatory Visit (HOSPITAL_COMMUNITY): Payer: Self-pay

## 2021-06-05 NOTE — Telephone Encounter (Signed)
This encounter was created in error - please disregard.

## 2021-06-05 NOTE — Telephone Encounter (Signed)
Allean Found, CMA  Earvin Hansen, LPN ?Approved thru 06/05/22  ? ?Mychart message sent to patient  ?

## 2021-06-09 NOTE — Telephone Encounter (Signed)
Late entry - see labs dated 02/25/2021 ?

## 2021-06-10 ENCOUNTER — Ambulatory Visit
Admission: RE | Admit: 2021-06-10 | Discharge: 2021-06-10 | Disposition: A | Discharge status: Home / Self Care | Payer: 59 | Source: Ambulatory Visit | Attending: Cardiovascular Disease | Admitting: Cardiovascular Disease

## 2021-06-10 DIAGNOSIS — Z1231 Encounter for screening mammogram for malignant neoplasm of breast: Secondary | ICD-10-CM | POA: Diagnosis not present

## 2021-06-11 NOTE — Progress Notes (Signed)
[facility] PREP Weekly Session ? ?Patient Details  ?Name: Jodi Bates ?MRN: [id number] ?Date of Birth: [birth year] ?Age: 56 y.o. ?PCP: Elby Showers, MD ? ?Vitals:  ? 06/10/21 1830  ?Weight: 210 lb 12.8 oz (95.6 kg)  ? ? ? [employer] Weekly seesion - 06/11/21 1500   ? ?  ? [facility] "PREP" Location  ? [facility] "PREP[facility] Location [place]   ?  ? Weekly Session  ? Topic Discussed Finding support   ? Minutes exercised this week 615 minutes   ? Classes attended to date 39   ? ?  ?  ? ?  ? ? ?Barnett Hatter ?06/11/2021, 3:29 PM ? ? ?

## 2021-06-16 ENCOUNTER — Other Ambulatory Visit (HOSPITAL_COMMUNITY): Payer: Self-pay

## 2021-07-01 ENCOUNTER — Other Ambulatory Visit (HOSPITAL_COMMUNITY): Payer: Self-pay

## 2021-07-09 NOTE — Progress Notes (Signed)
[employer] PREP Evaluation  Patient Details  Name: Yamil Dougher MRN: [id number] Date of Birth: Apr 09, 1965 Age: 57 y.o. PCP: Elby Showers, MD  Vitals:   07/03/21 1830  BP: 118/72  Pulse: 70  Weight: 210 lb (95.3 kg)     [facility] Eval - 07/09/21 1500       [facility] "PREP" Location   [facility]P" [facility]      Referral    Program Start Date 07/03/21   end date of program     Measurement   Waist Circumference 39 inches    Hip Circumference 45 inches    Body fat 44.6 percent      Mobility and Daily Activities   I find it easy to walk up or down two or more flights of stairs. 3    I have no trouble taking out the trash. 4    I do housework such as vacuuming and dusting on my own without difficulty. 4    I can easily lift a gallon of milk (8lbs). 4    I can easily walk a mile. 4    I have no trouble reaching into high cupboards or reaching down to pick up something from the floor. 4    I do not have trouble doing out-door work such as Armed forces logistics/support/administrative officer, raking leaves, or gardening. 4      Mobility and Daily Activities   I feel younger than my age. 3    I feel independent. 4    I feel energetic. 3    I live an active life.  4    I feel strong. 4    I feel healthy. 4    I feel active as other people my age. 4      How fit and strong are you.   Fit and Strong Total Score 53            Past Medical History:  Diagnosis Date   Allergy    Anxiety    Asthma    prn inhalers   Essential hypertension 03/22/2009   Qualifier: Diagnosis of  By: Olevia Perches, MD, Glenetta Hew    GERD (gastroesophageal reflux disease)    Gluteal cleft wound 03/2012   recurrent gluteal cleft infections   Headache(784.0)    tension   Hypertension    under control with meds., has been on med. > 5 yr.   Iron deficiency anemia    no current problems or meds.   Migraine headache with aura 04/09/2020   Morbid obesity ([place]) 11/10/2020   Prediabetes 02/14/2021   Past Surgical  History:  Procedure Laterality Date   BREAST CYST EXCISION     BREAST EXCISIONAL BIOPSY Left    BREAST SURGERY      lt breast/ benign cyst   CYST EXCISION     back of head   HYSTEROSCOPY WITH D & C  06/25/2004   INCISION AND DRAINAGE PERIRECTAL ABSCESS N/A 02/10/2013   Procedure: IRRIGATION AND DEBRIDEMENT PERIRECTAL ABSCESS;  Surgeon: Gwenyth Ober, MD;  Location: [facility];  Service: General;  Laterality: N/A;   LAPAROSCOPIC ENDOMETRIOSIS FULGURATION     PILONIDAL CYST EXCISION N/A 04/28/2012   Procedure: exam under anesthesia and exicision of pilonidal;  Surgeon: Gwenyth Ober, MD;  Location: [facility];  Service: General;  Laterality: N/A;   POLYPECTOMY     uterine   TUBAL LIGATION     Social History   Tobacco Use  Smoking Status Never  Smokeless Tobacco Never  Fit test: Cardio march: 235 to 264 Sit to stand: 12 to 17 Bicep curl: 22 to 29 Attended >21 workouts, and 11 of 12 educational sessions  Barnett Hatter 07/09/2021, 3:36 PM

## 2021-07-26 ENCOUNTER — Other Ambulatory Visit (HOSPITAL_COMMUNITY): Payer: Self-pay

## 2021-07-29 ENCOUNTER — Ambulatory Visit (HOSPITAL_COMMUNITY)
Admission: EM | Admit: 2021-07-29 | Discharge: 2021-07-29 | Disposition: A | Discharge status: Home / Self Care | Payer: 59 | Attending: Internal Medicine | Admitting: Internal Medicine

## 2021-07-29 ENCOUNTER — Encounter (HOSPITAL_COMMUNITY): Payer: Self-pay | Admitting: Emergency Medicine

## 2021-07-29 ENCOUNTER — Telehealth (HOSPITAL_COMMUNITY): Payer: Self-pay | Admitting: Emergency Medicine

## 2021-07-29 DIAGNOSIS — L02412 Cutaneous abscess of left axilla: Secondary | ICD-10-CM | POA: Diagnosis not present

## 2021-07-29 MED ORDER — LIDOCAINE HCL (PF) 1 % IJ SOLN
INTRAMUSCULAR | Status: AC
  Filled 2021-07-29: qty 2

## 2021-07-29 MED ORDER — LIDOCAINE HCL (PF) 1 % IJ SOLN
INTRAMUSCULAR | Status: AC
  Filled 2021-07-29: qty 6

## 2021-07-29 MED ORDER — DOXYCYCLINE HYCLATE 100 MG PO CAPS: 100.0000 mg | ORAL_CAPSULE | Freq: Two times a day (BID) | ORAL | 0 refills | Status: DC

## 2021-07-29 MED ORDER — DOXYCYCLINE HYCLATE 100 MG PO CAPS
100.0000 mg | ORAL_CAPSULE | Freq: Two times a day (BID) | ORAL | 0 refills | Status: DC
  Filled 2021-07-29: qty 20, 10d supply, fill #0

## 2021-07-29 NOTE — ED Provider Notes (Signed)
[place]    CSN: [id number] Arrival date & time: 07/29/21  0859      History   Chief Complaint Chief Complaint  Patient presents with   Abscess    HPI Jodi Bates is a 56 y.o. female.   Patient presents with abscess to left axilla that has been present for multiple weeks.  She reports that it started draining today.  Denies fever, body aches, chills.  She reports history of hidradenitis but has never been seen by dermatology.   Abscess   Past Medical History:  Diagnosis Date   Allergy    Anxiety    Asthma    prn inhalers   Essential hypertension 03/22/2009   Qualifier: Diagnosis of  By: Olevia Perches, MD, Glenetta Hew    GERD (gastroesophageal reflux disease)    Gluteal cleft wound 03/2012   recurrent gluteal cleft infections   Headache(784.0)    tension   Hypertension    under control with meds., has been on med. > 5 yr.   Iron deficiency anemia    no current problems or meds.   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   Infected pilonidal cyst 01/26/2012   Fatigue 12/29/2011   Vertigo 12/29/2011   Iron deficiency anemia 07/27/2011   Essential hypertension 03/22/2009   ASTHMA, UNSPECIFIED, UNSPECIFIED STATUS 03/22/2009   PALPITATIONS 03/22/2009    Past Surgical History:  Procedure Laterality Date   BREAST CYST EXCISION     BREAST EXCISIONAL BIOPSY Left    BREAST SURGERY      lt breast/ benign cyst   CYST EXCISION     back of head   HYSTEROSCOPY WITH D & C   06/25/2004   INCISION AND DRAINAGE PERIRECTAL ABSCESS N/A 02/10/2013   Procedure: IRRIGATION AND DEBRIDEMENT PERIRECTAL ABSCESS;  Surgeon: Gwenyth Ober, MD;  Location: [facility];  Service: General;  Laterality: N/A;   LAPAROSCOPIC ENDOMETRIOSIS FULGURATION     PILONIDAL CYST EXCISION N/A 04/28/2012   Procedure: exam under anesthesia and exicision of pilonidal;  Surgeon: Gwenyth Ober, MD;  Location: [facility];  Service: General;  Laterality: N/A;   POLYPECTOMY     uterine   TUBAL LIGATION      OB History   No obstetric history on file.      Home Medications    Prior to Admission medications   Medication Sig Start Date End Date Taking? Authorizing Provider  albuterol (PROVENTIL) (2.5 MG/3ML) 0.083% nebulizer solution Take 3 mLs by nebulization every 6 (six) hours as needed for wheezing or shortness of breath. 10/14/20   Kozlow, Donnamarie Poag, MD  albuterol (VENTOLIN HFA) 108 (90 Base) MCG/ACT inhaler Inhale 2 puffs into the lungs every 4 (four) hours as needed for wheezing or shortness of breath. 04/29/21   Kozlow, Donnamarie Poag, MD  ALPRAZolam Duanne Moron) 0.5 MG tablet Take 1 tablet (0.5 mg total) by mouth 2 (two) times daily as needed. 05/28/21     amLODipine (NORVASC) 10 MG tablet Take 1 tablet (10 mg total) by  mouth daily. 08/12/20   [name], MD  BREO ELLIPTA 100-25 MCG/ACT AEPB Inhale 1 puff into the lungs 1 to 7 times per week as needed 04/29/21   Kozlow, Donnamarie Poag, MD  doxycycline (VIBRAMYCIN) 100 MG capsule Take 1 capsule (100 mg total) by mouth 2 (two) times daily. 07/29/21   Teodora Medici, FNP  escitalopram (LEXAPRO) 20 MG tablet Take 1 tablet (20 mg total) by mouth daily. Patient taking differently: Take 20 mg by mouth at bedtime. 09/24/20     fexofenadine (ALLEGRA) 180 MG tablet Take 1 tablet (180 mg total) by mouth 2 (two) times daily as needed for allergies or rhinitis (Can take an extra dose during flare ups). 04/29/21   Kozlow, Donnamarie Poag, MD  fluticasone (FLONASE) 50 MCG/ACT nasal spray  Place 1-2 sprays into both nostrils 1 to 7 times per week as needed 04/29/21   Kozlow, Donnamarie Poag, MD  Ginger, Zingiber officinalis, (GINGER PO) Take 1 tablet by mouth in the morning and at bedtime. chewable    [provider]  latanoprost (XALATAN) 0.005 % ophthalmic solution Instill 1 drop into both eyes at bedtime 12/10/20     loratadine (CLARITIN) 10 MG tablet TAKE 1 TABLET (10 MG TOTAL) BY MOUTH DAILY. Patient taking differently: Take 10 mg by mouth daily as needed for allergies. 04/23/20 06/03/21  Kozlow, Donnamarie Poag, MD  losartan (COZAAR) 100 MG tablet Take 1 tablet (100 mg total) by mouth daily. 05/27/21   [name], MD  Semaglutide-Weight Management [facility]) 1 MG/0.5ML SOAJ Inject 1 mg into the skin once a week. 06/03/21   [name], MD  spironolactone (ALDACTONE) 25 MG tablet Take 1 tablet (25 mg total) by mouth daily. 03/07/21   [name], MD  SUMAtriptan (IMITREX) 25 MG tablet Take 1 tablet (25 mg total) by mouth every 2 (two) hours as needed for migraine. May repeat in 2 hours if headache persists or recurs. 08/13/20   Suzzanne Cloud, NP  topiramate (TOPAMAX) 25 MG tablet Take 3 tablets (75 mg total) by mouth at bedtime. 04/15/21   Suzzanne Cloud, NP    Family History Family History  Problem Relation Age of Onset   Diabetes Mother    Hypertension Mother    Allergic rhinitis Mother    Heart failure Mother    Cancer Father        deceased metastaic throat cancer   Esophageal cancer Father    Diabetes Brother    Pancreatic cancer Brother    Asthma Brother    Sleep apnea Brother    Cancer Maternal Grandmother        breast   Breast cancer Maternal Grandmother    Allergic rhinitis Son    Asthma Son    Colon cancer Neg Hx    Rectal cancer Neg Hx    Stomach cancer Neg Hx    Colon polyps Neg Hx     Social History Social History   Tobacco Use   Smoking status: Never   Smokeless tobacco: Never  Vaping Use   Vaping Use: Never used  Substance Use Topics    Alcohol use: No   Drug use: No     Allergies   Adhesive [tape], Latex, Soap, Hydrochlorothiazide, and Other   Review of Systems Review of Systems Per HPI  Physical Exam Triage Vital Signs ED Triage Vitals  Enc Vitals Group     BP 07/29/21 0912 123/90     Pulse Rate 07/29/21 0912 78  Resp 07/29/21 0912 17     Temp 07/29/21 0912 97.6 F (36.4 C)     Temp Source 07/29/21 0912 Oral     SpO2 07/29/21 0912 96 %     Weight --      Height --      Head Circumference --      Peak Flow --      Pain Score 07/29/21 0911 8     Pain Loc --      Pain Edu? --      Excl. in [place]? --    No data found.  Updated Vital Signs BP 123/90 (BP Location: Right Arm)   Pulse 78   Temp 97.6 F (36.4 C) (Oral)   Resp 17   LMP 06/11/2016 Comment: irregular   SpO2 96%   Visual Acuity Right Eye Distance:   Left Eye Distance:   Bilateral Distance:    Right Eye Near:   Left Eye Near:    Bilateral Near:     Physical Exam Constitutional:      General: She is not in acute distress.    Appearance: Normal appearance. She is not toxic-appearing or diaphoretic.  HENT:     Head: Normocephalic and atraumatic.  Eyes:     Extraocular Movements: Extraocular movements intact.     Conjunctiva/sclera: Conjunctivae normal.  Pulmonary:     Effort: Pulmonary effort is normal.  Skin:    Comments: Approximately 1 cm in diameter mildly fluctuant but mainly indurated abscess to left axilla.  There is a smaller approximately 0.25 to 0.5 cm in diameter indurated abscess with mild fluctuance present directly below first abscess.  There is some drainage that is purulent coming from top abscess.  Neurological:     General: No focal deficit present.     Mental Status: She is alert and oriented to person, place, and time. Mental status is at baseline.  Psychiatric:        Mood and Affect: Mood normal.        Behavior: Behavior normal.        Thought Content: Thought content normal.        Judgment: Judgment  normal.      UC Treatments / Results  Labs (all labs ordered are listed, but only abnormal results are displayed) Labs Reviewed - No data to display  EKG   Radiology No results found.  Procedures Incision and Drainage  Date/Time: 07/29/2021 10:35 AM  Performed by: Teodora Medici, FNP Authorized by: Teodora Medici, FNP   Consent:    Consent obtained:  Verbal   Consent given by:  Patient   Risks, benefits, and alternatives were discussed: yes     Risks discussed:  Bleeding, incomplete drainage and pain   Alternatives discussed:  No treatment, delayed treatment and alternative treatment Universal protocol:    Procedure explained and questions answered to patient or proxy's satisfaction: yes     Site/side marked: yes     Immediately prior to procedure, a time out was called: yes     Patient identity confirmed:  Verbally with patient and arm band Location:    Type:  Abscess   Size:  1, 0.5   Location:  Upper extremity   Upper extremity location: left axilla. Pre-procedure details:    Skin preparation:  Chlorhexidine with alcohol Anesthesia:    Anesthesia method:  Local infiltration   Local anesthetic:  Lidocaine 1% w/o epi Procedure type:    Complexity:  Simple Procedure details:  Incision types:  Stab incision   Wound management:  Probed and deloculated   Drainage:  Bloody and purulent   Drainage amount:  Scant   Wound treatment:  Wound left open   Packing materials:  None Post-procedure details:    Procedure completion:  Tolerated well, no immediate complications (nonadherent dressing applied)  (including critical care time)  Medications Ordered in UC Medications - No data to display  Initial Impression / Assessment and Plan / UC Course  I have reviewed the triage vital signs and the nursing notes.  Pertinent labs & imaging results that were available during my care of the patient were reviewed by me and considered in my medical decision making (see chart  for details).     Prior to procedure being completed, advised patient that it may not be beneficial given that there is a moderate amount of induration surrounding fluctuance.  Patient still wished to proceed with drainage.  Attempted I&D that showed a scant amount of drainage to both small abscesses.  Patient wished to stop drainage given tolerability.  Nonadherent dressing was applied and patient was advised of wound care.  Will treat with doxycycline antibiotic.  Advised warm compresses. Also provided patient with contact information for dermatology given that this is a recurrent issue for her.  Patient was given strict return and ER precautions.  Patient verbalized understanding and was agreeable with plan. Final Clinical Impressions(s) / UC Diagnoses   Final diagnoses:  Abscess of left axilla     Discharge Instructions      You are being treated with antibiotic for abscess to your armpit.  Please also use warm compresses to area.  Follow-up with dermatology at provided contact information given that this is a recurrent issue for you.  Please follow-up if symptoms persist or worsen.    ED Prescriptions     Medication Sig Dispense Auth. Provider   doxycycline (VIBRAMYCIN) 100 MG capsule Take 1 capsule (100 mg total) by mouth 2 (two) times daily. 20 capsule Teodora Medici, [place]      PDMP not reviewed this encounter.   Teodora Medici, [place] 07/29/21 1037

## 2021-07-29 NOTE — Discharge Instructions (Signed)
You are being treated with antibiotic for abscess to your armpit.  Please also use warm compresses to area.  Follow-up with dermatology at provided contact information given that this is a recurrent issue for you.  Please follow-up if symptoms persist or worsen.

## 2021-07-29 NOTE — ED Triage Notes (Signed)
Pt has hidradenitis  and reports hasnt had a flare up this bad in year. Has two swollen abscess in left axilla. The posterior one will drain some but not the more anterior one.

## 2021-07-30 ENCOUNTER — Other Ambulatory Visit (HOSPITAL_COMMUNITY): Payer: Self-pay

## 2021-08-18 ENCOUNTER — Other Ambulatory Visit (HOSPITAL_COMMUNITY): Payer: Self-pay

## 2021-08-21 ENCOUNTER — Other Ambulatory Visit (HOSPITAL_COMMUNITY): Payer: Self-pay

## 2021-08-22 ENCOUNTER — Other Ambulatory Visit (HOSPITAL_COMMUNITY): Payer: Self-pay

## 2021-08-23 ENCOUNTER — Other Ambulatory Visit (HOSPITAL_COMMUNITY): Payer: Self-pay

## 2021-08-26 ENCOUNTER — Other Ambulatory Visit (HOSPITAL_COMMUNITY): Payer: Self-pay

## 2021-08-26 ENCOUNTER — Telehealth: Payer: Self-pay | Admitting: Internal Medicine

## 2021-08-26 ENCOUNTER — Ambulatory Visit: Payer: 59 | Admitting: Internal Medicine

## 2021-08-26 ENCOUNTER — Encounter: Payer: Self-pay | Admitting: Internal Medicine

## 2021-08-26 ENCOUNTER — Other Ambulatory Visit: Payer: Self-pay | Admitting: Cardiovascular Disease

## 2021-08-26 VITALS — BP 116/74 | HR 67 | Temp 97.4°F | Wt 205.8 lb

## 2021-08-26 DIAGNOSIS — Z6835 Body mass index (BMI) 35.0-35.9, adult: Secondary | ICD-10-CM | POA: Diagnosis not present

## 2021-08-26 DIAGNOSIS — Z7184 Encounter for health counseling related to travel: Secondary | ICD-10-CM | POA: Diagnosis not present

## 2021-08-26 DIAGNOSIS — Z8659 Personal history of other mental and behavioral disorders: Secondary | ICD-10-CM

## 2021-08-26 DIAGNOSIS — I10 Essential (primary) hypertension: Secondary | ICD-10-CM

## 2021-08-26 DIAGNOSIS — Z8709 Personal history of other diseases of the respiratory system: Secondary | ICD-10-CM | POA: Diagnosis not present

## 2021-08-26 MED ORDER — ONDANSETRON HCL 4 MG PO TABS
4.0000 mg | ORAL_TABLET | Freq: Three times a day (TID) | ORAL | 0 refills | Status: DC | PRN
  Filled 2021-08-26: qty 20, 7d supply, fill #0

## 2021-08-26 MED ORDER — SCOPOLAMINE 1 MG/3DAYS TD PT72
1.0000 | MEDICATED_PATCH | TRANSDERMAL | 12 refills | Status: DC
  Filled 2021-08-26: qty 10, 30d supply, fill #0

## 2021-08-26 MED ORDER — CIPROFLOXACIN HCL 500 MG PO TABS
500.0000 mg | ORAL_TABLET | Freq: Two times a day (BID) | ORAL | 0 refills | Status: DC
  Filled 2021-08-26: qty 14, 7d supply, fill #0

## 2021-08-26 NOTE — Telephone Encounter (Signed)
scheduled

## 2021-08-26 NOTE — Progress Notes (Deleted)
     Annual Wellness Visit     Patient: Jodi Bates, Female    DOB: 21-Apr-1965, 56 y.o.   MRN: [id number] Visit Date: 08/26/2021  No chief complaint on file.  Subjective    Jodi Bates is a 56 y.o. female who presents today for her Annual Wellness Visit.  HPI   Social History   Social History Narrative   Lives with husband   Right Handed   Drinks no caffeine    Patient Care Team: Baxley, Cresenciano Lick, MD as PCP - General (Internal Medicine) [name], MD as PCP - Cardiology (Cardiology) Kennith Center, RD as Dietitian (Family Medicine)  Review of Systems   Objective    Vitals: LMP 06/11/2016 Comment: irregular   Physical Exam   Most recent functional status assessment:     No data to display         Most recent fall risk assessment:    01/03/2013    3:35 PM  [place] in the past year? No    Most recent depression screenings:    07/06/2019    4:32 PM 11/10/2016    2:24 PM  PHQ 2/9 Scores  PHQ - 2 Score 1 0   Most recent cognitive screening:     No data to display             Assessment & Plan     Annual wellness visit done today including the all of the following: Reviewed patient's Family Medical History Reviewed and updated list of patient's medical providers Assessment of cognitive impairment was done Assessed patient's functional ability Established a written schedule for health screening [place] Completed and Reviewed  Discussed health benefits of physical activity, and encouraged her to engage in regular exercise appropriate for her age and condition.         {provider attestation***:1}   Brita Jurgensen Barron [place]vine, CMA

## 2021-08-26 NOTE — Telephone Encounter (Signed)
Jodi Bates [phone]  Jodi Bates is leaving Friday to go on a trip and would like to come in today for some travel medicine.

## 2021-08-26 NOTE — Progress Notes (Unsigned)
   Subjective:    Patient ID: Jodi Bates, female    DOB: 1965-12-31, 56 y.o.   MRN: [id number]  HPI    Review of Systems     Objective:   Physical Exam        Assessment & Plan:

## 2021-08-27 ENCOUNTER — Other Ambulatory Visit (HOSPITAL_COMMUNITY): Payer: Self-pay

## 2021-08-27 ENCOUNTER — Telehealth: Payer: Self-pay | Admitting: Cardiovascular Disease

## 2021-08-27 ENCOUNTER — Other Ambulatory Visit: Payer: Self-pay

## 2021-08-27 MED ORDER — AMLODIPINE BESYLATE 10 MG PO TABS
10.0000 mg | ORAL_TABLET | Freq: Every day | ORAL | 3 refills | Status: DC
  Filled 2021-08-27: qty 90, 90d supply, fill #0
  Filled 2021-11-21: qty 90, 90d supply, fill #1
  Filled 2022-02-16: qty 90, 90d supply, fill #2
  Filled 2022-05-19: qty 90, 90d supply, fill #3

## 2021-08-27 NOTE — Telephone Encounter (Signed)
Rx request sent to pharmacy.  

## 2021-08-27 NOTE — Patient Instructions (Addendum)
It was a pleasure to see you today.  Have prescribed Transderm- scop patches for potential seasickness on cruise.  Also prescribed

## 2021-08-27 NOTE — Telephone Encounter (Signed)
*  STAT* If patient is at the pharmacy, call can be transferred to refill team.   1. Which medications need to be refilled? (please list name of each medication and dose if known)   amLODipine (NORVASC) 10 MG tablet    2. Which pharmacy/location (including street and city if local pharmacy) is medication to be sent to? [facility]  3. Do they need a 30 day or 90 day supply?  90 day

## 2021-09-16 DIAGNOSIS — H04123 Dry eye syndrome of bilateral lacrimal glands: Secondary | ICD-10-CM | POA: Diagnosis not present

## 2021-09-16 DIAGNOSIS — H40013 Open angle with borderline findings, low risk, bilateral: Secondary | ICD-10-CM | POA: Diagnosis not present

## 2021-09-17 ENCOUNTER — Other Ambulatory Visit (HOSPITAL_BASED_OUTPATIENT_CLINIC_OR_DEPARTMENT_OTHER): Payer: Self-pay | Admitting: Cardiovascular Disease

## 2021-09-18 ENCOUNTER — Other Ambulatory Visit (HOSPITAL_COMMUNITY): Payer: Self-pay

## 2021-09-18 MED ORDER — SPIRONOLACTONE 25 MG PO TABS
25.0000 mg | ORAL_TABLET | Freq: Every day | ORAL | 1 refills | Status: DC
  Filled 2021-09-18 (×2): qty 90, 90d supply, fill #0
  Filled 2021-12-22: qty 90, 90d supply, fill #1

## 2021-09-18 NOTE — Telephone Encounter (Signed)
Rx request sent to pharmacy.  

## 2021-09-19 ENCOUNTER — Other Ambulatory Visit (HOSPITAL_COMMUNITY): Payer: Self-pay

## 2021-10-02 ENCOUNTER — Other Ambulatory Visit (HOSPITAL_COMMUNITY): Payer: Self-pay

## 2021-10-03 ENCOUNTER — Other Ambulatory Visit (HOSPITAL_COMMUNITY): Payer: Self-pay

## 2021-10-04 ENCOUNTER — Other Ambulatory Visit (HOSPITAL_COMMUNITY): Payer: Self-pay

## 2021-10-07 DIAGNOSIS — Z01419 Encounter for gynecological examination (general) (routine) without abnormal findings: Secondary | ICD-10-CM | POA: Diagnosis not present

## 2021-10-07 DIAGNOSIS — Z6834 Body mass index (BMI) 34.0-34.9, adult: Secondary | ICD-10-CM | POA: Diagnosis not present

## 2021-10-07 DIAGNOSIS — Z1151 Encounter for screening for human papillomavirus (HPV): Secondary | ICD-10-CM | POA: Diagnosis not present

## 2021-10-07 DIAGNOSIS — Z124 Encounter for screening for malignant neoplasm of cervix: Secondary | ICD-10-CM | POA: Diagnosis not present

## 2021-10-07 LAB — HM PAP SMEAR

## 2021-10-09 ENCOUNTER — Other Ambulatory Visit: Payer: Self-pay | Admitting: Obstetrics and Gynecology

## 2021-10-09 ENCOUNTER — Other Ambulatory Visit (HOSPITAL_COMMUNITY): Payer: Self-pay | Admitting: Obstetrics and Gynecology

## 2021-10-09 DIAGNOSIS — Z800 Family history of malignant neoplasm of digestive organs: Secondary | ICD-10-CM

## 2021-10-18 ENCOUNTER — Other Ambulatory Visit (HOSPITAL_COMMUNITY): Payer: Self-pay

## 2021-10-20 ENCOUNTER — Other Ambulatory Visit (HOSPITAL_BASED_OUTPATIENT_CLINIC_OR_DEPARTMENT_OTHER): Payer: Self-pay

## 2021-10-21 ENCOUNTER — Other Ambulatory Visit (HOSPITAL_COMMUNITY): Payer: Self-pay | Admitting: Obstetrics and Gynecology

## 2021-10-21 ENCOUNTER — Ambulatory Visit (HOSPITAL_COMMUNITY)
Admission: RE | Admit: 2021-10-21 | Discharge: 2021-10-21 | Disposition: A | Discharge status: Home / Self Care | Payer: 59 | Source: Ambulatory Visit | Attending: Obstetrics and Gynecology | Admitting: Obstetrics and Gynecology

## 2021-10-21 DIAGNOSIS — Z1289 Encounter for screening for malignant neoplasm of other sites: Secondary | ICD-10-CM | POA: Insufficient documentation

## 2021-10-21 DIAGNOSIS — Z800 Family history of malignant neoplasm of digestive organs: Secondary | ICD-10-CM

## 2021-10-21 DIAGNOSIS — R935 Abnormal findings on diagnostic imaging of other abdominal regions, including retroperitoneum: Secondary | ICD-10-CM | POA: Diagnosis not present

## 2021-10-21 MED ORDER — GADOPICLENOL 0.5 MMOL/ML IV SOLN
9.0000 mL | Freq: Once | INTRAVENOUS | Status: AC | PRN
  Administered 2021-10-21: 9 mL via INTRAVENOUS

## 2021-11-14 ENCOUNTER — Other Ambulatory Visit (HOSPITAL_BASED_OUTPATIENT_CLINIC_OR_DEPARTMENT_OTHER): Payer: Self-pay

## 2021-11-18 ENCOUNTER — Other Ambulatory Visit (HOSPITAL_COMMUNITY): Payer: Self-pay

## 2021-11-21 ENCOUNTER — Other Ambulatory Visit (HOSPITAL_COMMUNITY): Payer: Self-pay

## 2021-12-11 ENCOUNTER — Other Ambulatory Visit (HOSPITAL_BASED_OUTPATIENT_CLINIC_OR_DEPARTMENT_OTHER): Payer: Self-pay

## 2021-12-22 ENCOUNTER — Other Ambulatory Visit: Payer: Self-pay | Admitting: Radiology

## 2021-12-22 ENCOUNTER — Other Ambulatory Visit (HOSPITAL_COMMUNITY): Payer: Self-pay

## 2021-12-25 ENCOUNTER — Other Ambulatory Visit (HOSPITAL_COMMUNITY): Payer: Self-pay

## 2021-12-26 ENCOUNTER — Other Ambulatory Visit (HOSPITAL_COMMUNITY): Payer: Self-pay

## 2021-12-29 ENCOUNTER — Other Ambulatory Visit (HOSPITAL_COMMUNITY): Payer: Self-pay

## 2021-12-29 MED ORDER — ESCITALOPRAM OXALATE 20 MG PO TABS
20.0000 mg | ORAL_TABLET | Freq: Every day | ORAL | 2 refills | Status: DC
  Filled 2021-12-29: qty 90, 90d supply, fill #0
  Filled 2022-03-25: qty 90, 90d supply, fill #1
  Filled 2022-06-23: qty 90, 90d supply, fill #2

## 2022-01-09 ENCOUNTER — Other Ambulatory Visit (HOSPITAL_BASED_OUTPATIENT_CLINIC_OR_DEPARTMENT_OTHER): Payer: Self-pay

## 2022-02-05 ENCOUNTER — Other Ambulatory Visit (HOSPITAL_BASED_OUTPATIENT_CLINIC_OR_DEPARTMENT_OTHER): Payer: Self-pay

## 2022-02-05 ENCOUNTER — Other Ambulatory Visit (HOSPITAL_COMMUNITY): Payer: Self-pay

## 2022-02-05 MED ORDER — LATANOPROST 0.005 % OP SOLN
1.0000 [drp] | Freq: Every day | OPHTHALMIC | 11 refills | Status: DC
  Filled 2022-02-05: qty 2.5, 25d supply, fill #0
  Filled 2022-03-25: qty 2.5, 25d supply, fill #1
  Filled 2022-05-15: qty 2.5, 25d supply, fill #2
  Filled 2022-06-15: qty 2.5, 25d supply, fill #3
  Filled 2022-09-19: qty 2.5, 25d supply, fill #4
  Filled 2022-11-03: qty 2.5, 25d supply, fill #5
  Filled 2022-12-03: qty 2.5, 25d supply, fill #6
  Filled 2023-01-16: qty 2.5, 25d supply, fill #7

## 2022-02-16 ENCOUNTER — Other Ambulatory Visit (HOSPITAL_COMMUNITY): Payer: Self-pay

## 2022-02-18 ENCOUNTER — Other Ambulatory Visit (HOSPITAL_COMMUNITY): Payer: Self-pay

## 2022-02-18 ENCOUNTER — Other Ambulatory Visit: Payer: Self-pay | Admitting: Cardiovascular Disease

## 2022-02-19 ENCOUNTER — Other Ambulatory Visit (HOSPITAL_COMMUNITY): Payer: Self-pay

## 2022-02-19 MED ORDER — LOSARTAN POTASSIUM 100 MG PO TABS
100.0000 mg | ORAL_TABLET | Freq: Every day | ORAL | 1 refills | Status: DC
  Filled 2022-02-19: qty 90, 90d supply, fill #0
  Filled 2022-05-15: qty 90, 90d supply, fill #1

## 2022-03-01 ENCOUNTER — Encounter: Payer: Self-pay | Admitting: Internal Medicine

## 2022-03-05 ENCOUNTER — Other Ambulatory Visit (HOSPITAL_BASED_OUTPATIENT_CLINIC_OR_DEPARTMENT_OTHER): Payer: Self-pay

## 2022-03-17 ENCOUNTER — Other Ambulatory Visit (HOSPITAL_COMMUNITY): Payer: Self-pay

## 2022-03-17 ENCOUNTER — Other Ambulatory Visit (HOSPITAL_BASED_OUTPATIENT_CLINIC_OR_DEPARTMENT_OTHER): Payer: Self-pay | Admitting: Cardiovascular Disease

## 2022-03-17 MED ORDER — SPIRONOLACTONE 25 MG PO TABS
25.0000 mg | ORAL_TABLET | Freq: Every day | ORAL | 1 refills | Status: DC
  Filled 2022-03-17: qty 90, 90d supply, fill #0
  Filled 2022-06-15: qty 90, 90d supply, fill #1

## 2022-03-17 NOTE — Telephone Encounter (Signed)
Rx request sent to pharmacy.  

## 2022-03-24 DIAGNOSIS — H40013 Open angle with borderline findings, low risk, bilateral: Secondary | ICD-10-CM | POA: Diagnosis not present

## 2022-03-24 DIAGNOSIS — H04123 Dry eye syndrome of bilateral lacrimal glands: Secondary | ICD-10-CM | POA: Diagnosis not present

## 2022-04-01 ENCOUNTER — Other Ambulatory Visit (HOSPITAL_BASED_OUTPATIENT_CLINIC_OR_DEPARTMENT_OTHER): Payer: Self-pay

## 2022-04-09 ENCOUNTER — Other Ambulatory Visit (HOSPITAL_COMMUNITY): Payer: Self-pay

## 2022-04-09 DIAGNOSIS — H20042 Secondary noninfectious iridocyclitis, left eye: Secondary | ICD-10-CM | POA: Diagnosis not present

## 2022-04-09 DIAGNOSIS — S0512XA Contusion of eyeball and orbital tissues, left eye, initial encounter: Secondary | ICD-10-CM | POA: Diagnosis not present

## 2022-04-09 MED ORDER — PREDNISOLONE ACETATE 1 % OP SUSP
1.0000 [drp] | Freq: Four times a day (QID) | OPHTHALMIC | 0 refills | Status: DC
  Filled 2022-04-09: qty 10, 38d supply, fill #0

## 2022-04-21 ENCOUNTER — Ambulatory Visit: Payer: 59 | Admitting: Neurology

## 2022-04-21 ENCOUNTER — Other Ambulatory Visit (HOSPITAL_COMMUNITY): Payer: Self-pay

## 2022-04-21 ENCOUNTER — Encounter: Payer: Self-pay | Admitting: Neurology

## 2022-04-21 VITALS — BP 110/78 | HR 72 | Ht 64.0 in | Wt 192.0 lb

## 2022-04-21 DIAGNOSIS — G43109 Migraine with aura, not intractable, without status migrainosus: Secondary | ICD-10-CM | POA: Diagnosis not present

## 2022-04-21 DIAGNOSIS — S0512XD Contusion of eyeball and orbital tissues, left eye, subsequent encounter: Secondary | ICD-10-CM | POA: Diagnosis not present

## 2022-04-21 DIAGNOSIS — H20042 Secondary noninfectious iridocyclitis, left eye: Secondary | ICD-10-CM | POA: Diagnosis not present

## 2022-04-21 MED ORDER — SUMATRIPTAN SUCCINATE 25 MG PO TABS
25.0000 mg | ORAL_TABLET | ORAL | 5 refills | Status: AC | PRN
  Filled 2022-04-21: qty 10, 30d supply, fill #0

## 2022-04-21 MED ORDER — TOPIRAMATE 25 MG PO TABS
75.0000 mg | ORAL_TABLET | Freq: Every day | ORAL | 3 refills | Status: DC
  Filled 2022-04-21 – 2022-05-15 (×2): qty 270, 90d supply, fill #0
  Filled 2022-09-19: qty 270, 90d supply, fill #1
  Filled 2023-01-16: qty 270, 90d supply, fill #2

## 2022-04-21 NOTE — Progress Notes (Signed)
PATIENT: Jodi Bates DOB: [birth year]  REASON FOR VISIT: follow up for migraine HISTORY FROM: patient Primary Neurologist: Dr. Jannifer Franklin, now Dr. Billey Gosling  HISTORY OF PRESENT ILLNESS: Today 04/21/22 4 migraines a month on average, but varies, depends on diet. Still on Topamax 75 mg at bedtime, tolerates well, no side effects. Has not needed Imitrex. Takes Tylenol for migraine with good benefit. Sometimes migraines cause vertigo. Remains on on Wegovy, has lost 30 lbs.   04/15/21 SS: Jodi Bates here today for follow up. Remains on Topamax 75 mg at bedtime. Hasn't taken the Imitrex. Headaches are not severe, doesn't need any rescue medication. Headache syndrome starts as dizziness, then bifrontal headache, none of this in months, since COVID back in October, during [place] Dr. Jannifer Franklin sent in 2 rounds of Decadron with excellent benefit. Saw ENT for vertigo. Back in November, felt good candidate for vestibular rehab, but never heard from referral. Now, no issues with vertigo, but is careful with position changes with lying down. Monitors blood pressure closely. On weight loss injection, Wegovy since Feb, is exercising. [facility]   08/13/2020 SS: Jodi Bates is a 57 year old female with history of migraine headache with vertigo.  Is on Topamax, Maxalt as needed. Has the dizziness sensation then develops bifrontal headache. Doing well with Topamax 75 mg at bedtime. Less spells, noticed it is correlated with diet. June 23rd last spell. No side effects from the Topamax. Stopped the Maxalt, only took 1/2 tablet, made her head feel like vice grip. It did make the headache go away. Claims is 90% better. No changes to medical history. 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  Overall, much improved.  Here today alone.  HISTORY  04/09/2020 Dr. Jannifer Franklin: Jodi Bates is a 57 year old right-handed black female with a history of migraine headaches since she was a teenager.  The patient has a  prominent family history for migraine, her son and daughter and her mother also have headaches.  The patient has been seen and evaluated through this office in 2013 for episodes of vertigo and headache that have occurred together since 2008.  The patient had an event about 10 days ago with more severe symptoms.  She was driving to work and started having some problems with vertigo with a spinning sensation and associated nausea without vomiting.  The patient was off balance when she try to get to work and needed help to get to work and had to leave early.  She developed a bifrontal headache which is typical for these events.  The patient at times may have a feeling of muffled hearing in the right greater than left ear, she may at times have some tinnitus that may occur with these events.  The patient denies any double vision or loss of vision, she denies any slurred speech or problems swallowing.  She has never had any syncope.  She denies any cognitive clouding with the events.  The patient denies any problems with neck stiffness.  She may take alprazolam and Pepto-Bismol for the events.  She has never been on any medications for migraine.  She does not drink any caffeinated products during the day.  She is sent to this office for further evaluation of the above events.  She was seen by Dr. Leta Baptist through this office in 2013 for episodes of vertigo, MRI of the brain at that time was unremarkable.  REVIEW OF SYSTEMS: Out of a complete 14 system review of symptoms, the patient complains only  of the following symptoms, and all other reviewed systems are negative.  See HPI  ALLERGIES: Allergies  Allergen Reactions   Adhesive [Tape] Itching and Other (See Comments)    Reaction:  Blisters    Latex Itching, Rash and Other (See Comments)   Soap Other (See Comments)    Soaps that contain any kind of fragrance triggers an asthma attack.     Hydrochlorothiazide     Hypokalemia   Other Cough    Fragrances      HOME MEDICATIONS: Outpatient Medications Prior to Visit  Medication Sig Dispense Refill   albuterol (PROVENTIL) (2.5 MG/3ML) 0.083% nebulizer solution Take 3 mLs by nebulization every 6 (six) hours as needed for wheezing or shortness of breath. 75 mL 1   albuterol (VENTOLIN HFA) 108 (90 Base) MCG/ACT inhaler Inhale 2 puffs into the lungs every 4 (four) hours as needed for wheezing or shortness of breath. 18 g 1   ALPRAZolam (XANAX) 0.5 MG tablet Take 1 tablet (0.5 mg total) by mouth 2 (two) times daily as needed. 60 tablet 0   amLODipine (NORVASC) 10 MG tablet Take 1 tablet (10 mg total) by mouth daily. 90 tablet 3   BREO ELLIPTA 100-25 MCG/ACT AEPB Inhale 1 puff into the lungs 1 to 7 times per week as needed 60 each 5   escitalopram (LEXAPRO) 20 MG tablet Take 1 tablet (20 mg total) by mouth daily. 90 tablet 2   fluticasone (FLONASE) 50 MCG/ACT nasal spray Place 1-2 sprays into both nostrils 1 to 7 times per week as needed 16 g 5   Ginger, Zingiber officinalis, (GINGER PO) Take 1 tablet by mouth in the morning and at bedtime. chewable     latanoprost (XALATAN) 0.005 % ophthalmic solution Place 1 drop into both eyes at bedtime. 2.5 mL 11   losartan (COZAAR) 100 MG tablet Take 1 tablet (100 mg total) by mouth daily. 90 tablet 1   Semaglutide-Weight Management (WEGOVY) 1 MG/0.5ML SOAJ Inject 1 mg into the skin once a week. 2 mL 11   spironolactone (ALDACTONE) 25 MG tablet Take 1 tablet (25 mg total) by mouth daily. 90 tablet 1   SUMAtriptan (IMITREX) 25 MG tablet Take 1 tablet (25 mg total) by mouth every 2 (two) hours as needed for migraine. May repeat in 2 hours if headache persists or recurs. 10 tablet 5   topiramate (TOPAMAX) 25 MG tablet Take 3 tablets (75 mg total) by mouth at bedtime. 270 tablet 3   loratadine (CLARITIN) 10 MG tablet TAKE 1 TABLET (10 MG TOTAL) BY MOUTH DAILY. (Patient taking differently: Take 10 mg by mouth daily as needed for allergies.) 30 tablet 5   ciprofloxacin  (CIPRO) 500 MG tablet Take 1 tablet (500 mg total) by mouth 2 (two) times daily. 14 tablet 0   doxycycline (VIBRAMYCIN) 100 MG capsule Take 1 capsule (100 mg total) by mouth 2 (two) times daily. 20 capsule 0   escitalopram (LEXAPRO) 20 MG tablet Take 1 tablet (20 mg total) by mouth daily. (Patient taking differently: Take 20 mg by mouth at bedtime.) 90 tablet 4   ondansetron (ZOFRAN) 4 MG tablet Take 1 tablet (4 mg total) by mouth every 8 (eight) hours as needed for nausea or vomiting. 20 tablet 0   prednisoLONE acetate (PRED FORTE) 1 % ophthalmic suspension Place 1 drop into the left eye 4 (four) times daily. 10 mL 0   scopolamine (TRANSDERM-SCOP) 1 MG/3DAYS Place 1 patch (1.5 mg total) onto the skin every 3 (  three) days. 10 patch 12   No facility-administered medications prior to visit.    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 > 5 yr.   Iron deficiency anemia    no current problems or meds.   Migraine headache with aura 04/09/2020   Morbid obesity ([place]) 11/10/2020   Prediabetes 02/14/2021    PAST SURGICAL HISTORY: Past Surgical History:  Procedure Laterality Date   BREAST CYST EXCISION     BREAST EXCISIONAL BIOPSY Left    BREAST SURGERY      lt breast/ benign cyst   CYST EXCISION     back of head   HYSTEROSCOPY WITH D & C  06/25/2004   INCISION AND DRAINAGE PERIRECTAL ABSCESS N/A 02/10/2013   Procedure: IRRIGATION AND DEBRIDEMENT PERIRECTAL ABSCESS;  Surgeon: Gwenyth Ober, MD;  Location: [place];  Service: General;  Laterality: N/A;   LAPAROSCOPIC ENDOMETRIOSIS FULGURATION     PILONIDAL CYST EXCISION N/A 04/28/2012    Procedure: exam under anesthesia and exicision of pilonidal;  Surgeon: Gwenyth Ober, MD;  Location: [facility];  Service: General;  Laterality: N/A;   POLYPECTOMY     uterine   TUBAL LIGATION      FAMILY HISTORY: Family History  Problem Relation Age of Onset   Diabetes Mother    Hypertension Mother    Allergic rhinitis Mother    Heart failure Mother    Cancer Father        deceased metastaic throat cancer   Esophageal cancer Father    Diabetes Brother    Pancreatic cancer Brother    Asthma Brother    Sleep apnea Brother    Cancer Maternal Grandmother        breast   Breast cancer Maternal Grandmother    Allergic rhinitis Son    Asthma Son    Colon cancer Neg Hx    Rectal cancer Neg Hx    Stomach cancer Neg Hx    Colon polyps Neg Hx     SOCIAL HISTORY: Social History   Socioeconomic History   Marital status: Married    Spouse name: Not on file   Number of children: Not on file   Years of education: Not on file   Highest education level: Not on file  Occupational History   Occupation: full time  Tobacco Use   Smoking status: Never   Smokeless tobacco: Never  Vaping Use   Vaping Use: Never used  Substance and Sexual Activity   Alcohol use: No   Drug use: No   Sexual activity: Yes    Partners: Male    Birth control/protection: None  Other Topics Concern   Not on file  Social History Narrative   Lives with husband   Right Handed   Drinks no caffeine   Social Determinants of Health   Financial Resource Strain: Not on file  Food Insecurity: Not on file  Transportation Needs: Not on file  Physical Activity: Not on file  Stress: Not on file  Social Connections: Not on file  Intimate  Partner Violence: Not on file   PHYSICAL EXAM  Vitals:   04/21/22 0829  BP: 110/78  Pulse: 72  Weight: 192 lb (87.1 kg)  Height: 5\' 4"  (1.626 m)     Body mass index is 32.96 kg/m.  Generalized: Well developed, in no acute distress   Neurological  examination  Mentation: Alert oriented to time, place, history taking. Follows all commands speech and language fluent Cranial nerve II-XII: Pupils were equal round reactive to light. Extraocular movements were full, visual field were full on confrontational test. Facial sensation and strength were normal. Head turning and shoulder shrug  were normal and symmetric. Motor: The motor testing reveals 5 over 5 strength of all 4 extremities. Good symmetric motor tone is noted throughout.  Sensory: Sensory testing is intact to soft touch on all 4 extremities. No evidence of extinction is noted.  Coordination: Cerebellar testing reveals good finger-nose-finger and heel-to-shin bilaterally.  Gait and station: Gait is normal. Reflexes: Deep tendon reflexes are symmetric and normal bilaterally.   DIAGNOSTIC DATA (LABS, IMAGING, TESTING) - I reviewed patient records, labs, notes, testing and imaging myself where available.  Lab Results  Component Value Date   WBC 5.6 02/01/2021   HGB 14.0 02/01/2021   HCT 42.0 02/01/2021   MCV 90.9 02/01/2021   PLT 198 02/01/2021      Component Value Date/Time   NA 140 05/29/2021 0000   K 4.1 05/29/2021 0000   CL 106 05/29/2021 0000   CO2 24 05/29/2021 0000   GLUCOSE 100 (H) 05/29/2021 0000   GLUCOSE 123 (H) 02/01/2021 1308   BUN 15 05/29/2021 0000   CREATININE 0.96 05/29/2021 0000   CREATININE 0.73 12/14/2019 1602   CALCIUM 8.9 05/29/2021 0000   PROT 6.4 05/29/2021 0000   ALBUMIN 4.3 05/29/2021 0000   AST 16 05/29/2021 0000   ALT 14 05/29/2021 0000   ALKPHOS 74 05/29/2021 0000   BILITOT 0.3 05/29/2021 0000   GFRNONAA >60 02/01/2021 1308   GFRNONAA 93 12/14/2019 1602   GFRAA 108 12/14/2019 1602   Lab Results  Component Value Date   CHOL 161 05/29/2021   HDL 45 05/29/2021   LDLCALC 99 05/29/2021   TRIG 93 05/29/2021   CHOLHDL 3.6 05/29/2021   Lab Results  Component Value Date   HGBA1C 5.4 05/29/2021   No results found for: "VITAMINB12" Lab  Results  Component Value Date   TSH 2.11 03/25/2015   ASSESSMENT AND PLAN 57 y.o. year old female  has a past medical history of Allergy, Anxiety, Asthma, Essential hypertension (03/22/2009), GERD (gastroesophageal reflux disease), Gluteal cleft wound (03/2012), Headache(784.0), Hypertension, Iron deficiency anemia, Migraine headache with aura (04/09/2020), Morbid obesity ([place]) (11/10/2020), and Prediabetes (02/14/2021). here with:  1.  Migraine headache, vertigo as aura  -Headaches remain under excellent control  -Will try to slowly wean Topamax to 50 mg, if she does well on this, may continue to further reduce, stop if headaches increase -Continue Tylenol as needed for headache, has Imitrex if needed -Follow up in 1 year VV  Butler Denmark, AGNP-C, DNP 04/21/2022, 8:41 AM [facility] [address], [place], [place] [address] [phone]

## 2022-04-21 NOTE — Patient Instructions (Signed)
Try to wean down the Topamax by 25 mg every 4-6 weeks, monitor for worsening headaches, keep me posted, reach out if you need anything!

## 2022-04-28 IMAGING — CT CT ANGIO CHEST
2 of 7 series · 18 of 46 positions shown · IV contrast (omnipaque)
Comparison: Chest radiograph dated 10/11/2020 and CT chest dated
12/12/2014.

CLINICAL DATA: Substernal chest pain, concern for pulmonary
embolism.

EXAM:
CT ANGIOGRAPHY CHEST WITH CONTRAST
TECHNIQUE: Multidetector CT imaging of the chest was performed using the
standard protocol during bolus administration of intravenous
contrast. Multiplanar CT image reconstructions and MIPs were
obtained to evaluate the vascular anatomy.
CONTRAST:  75mL OMNIPAQUE IOHEXOL 350 MG/ML SOLN

[Series 7: thins · axial · 0.81mm/px · z∈[+1034,+1270]mm · 15 of 264 slices shown]
[im 14/264  lung]
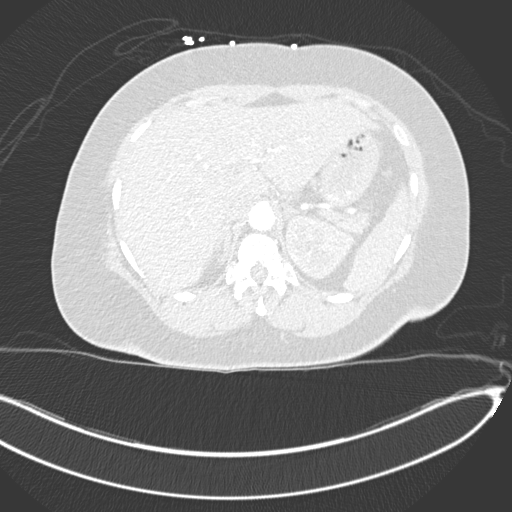
[im 28/264  soft-tissue]
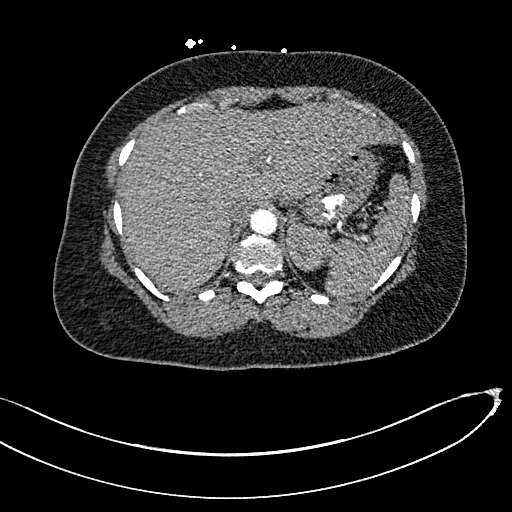
[im 56/264  lung]
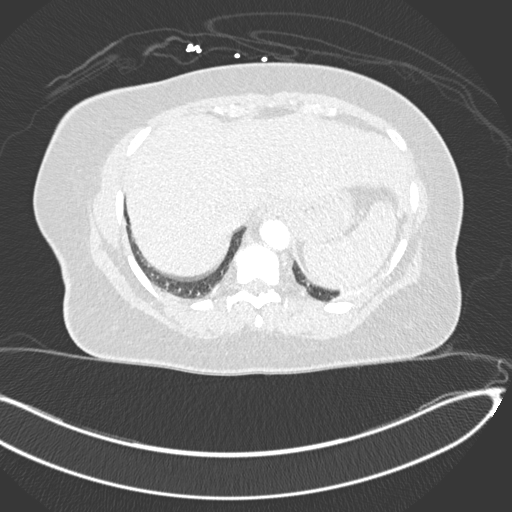
[im 70/264  soft-tissue]
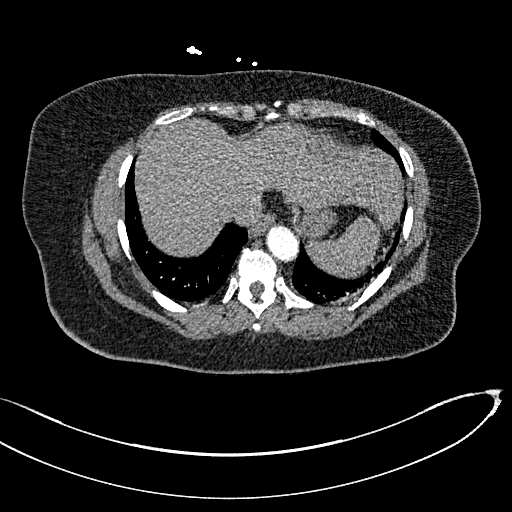
[im 84/264  lung]
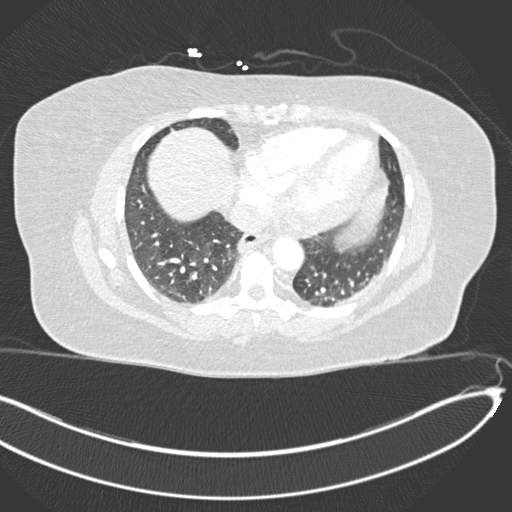
[im 97/264  soft-tissue]
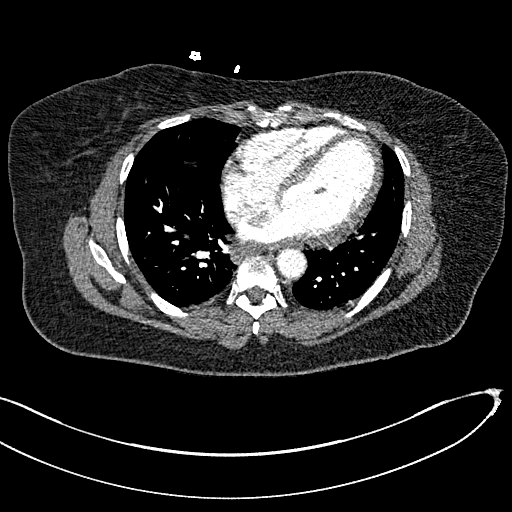
[im 111/264  lung]
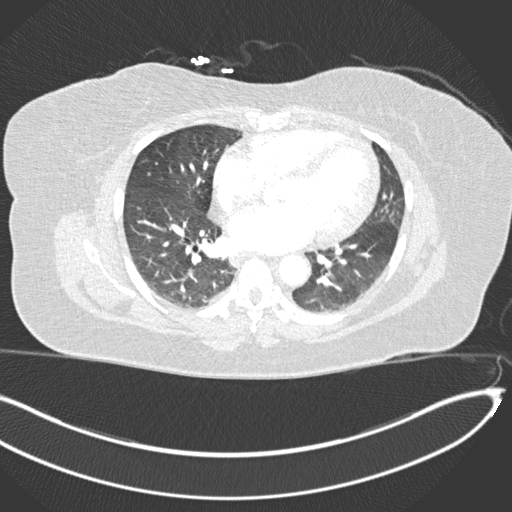
[im 139/264  soft-tissue]
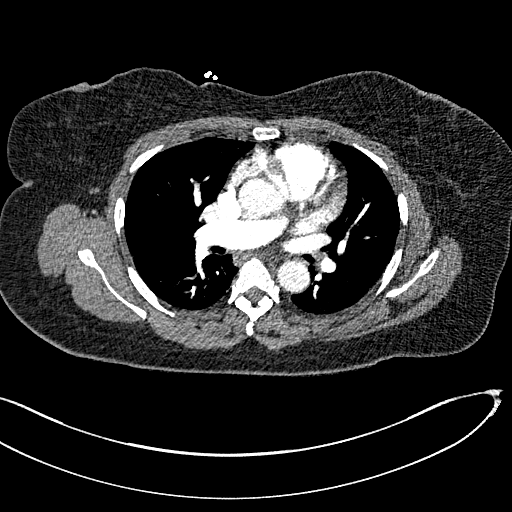
[im 153/264  lung]
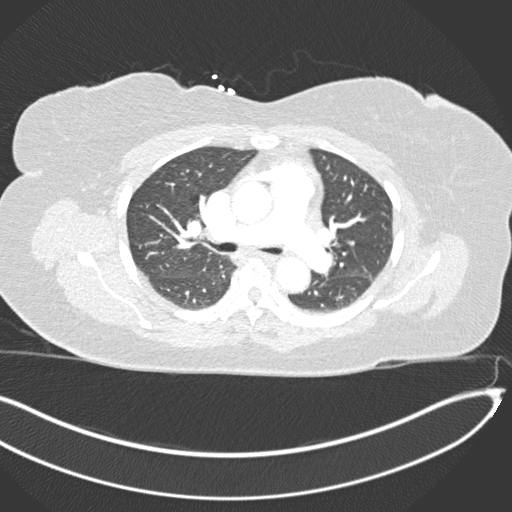
[im 167/264  soft-tissue]
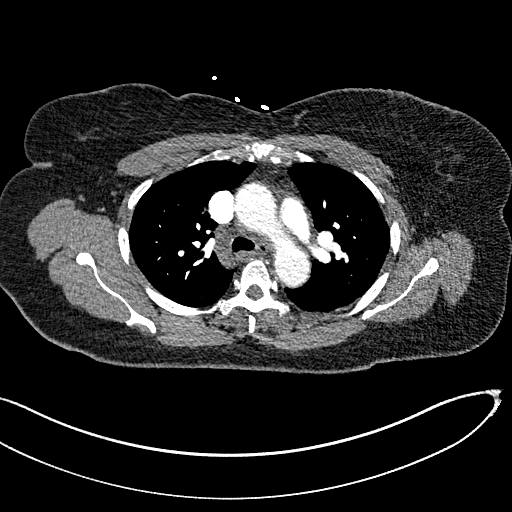
[im 180/264  lung]
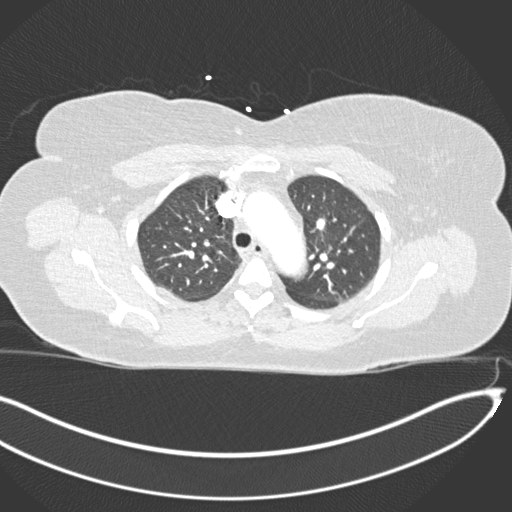
[im 194/264  soft-tissue]
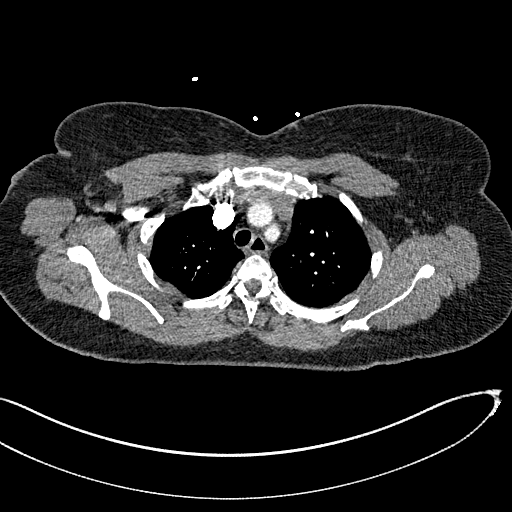
[im 222/264  lung]
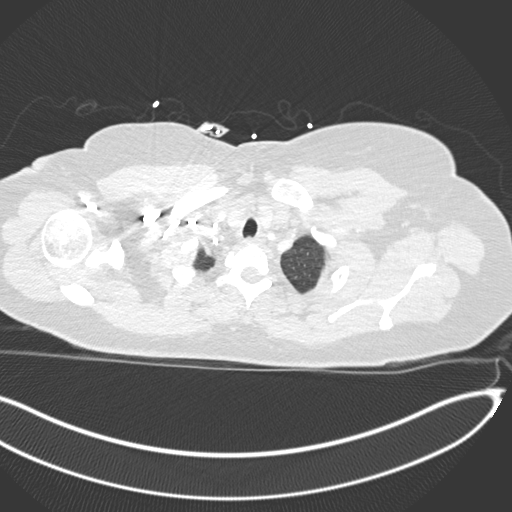
[im 236/264  soft-tissue]
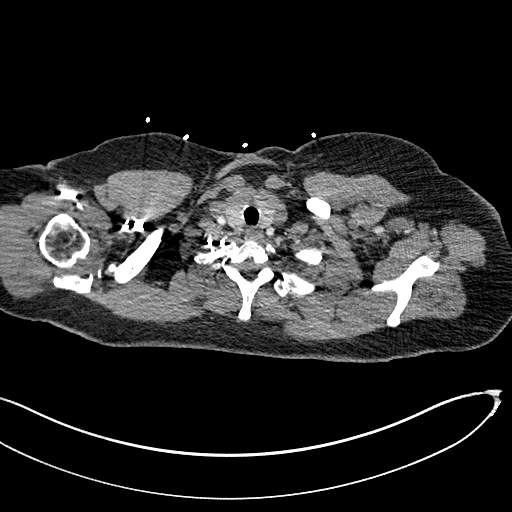
[im 250/264  lung]
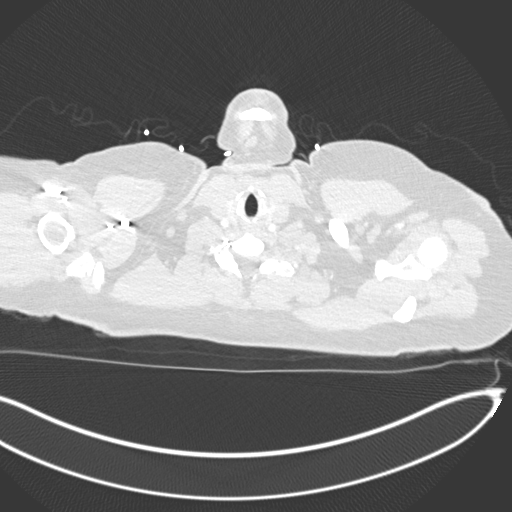

[Series 9: coronal mpr · coronal · 0.56mm/px · 3 of 129 slices shown]
[im 33/129  soft-tissue]
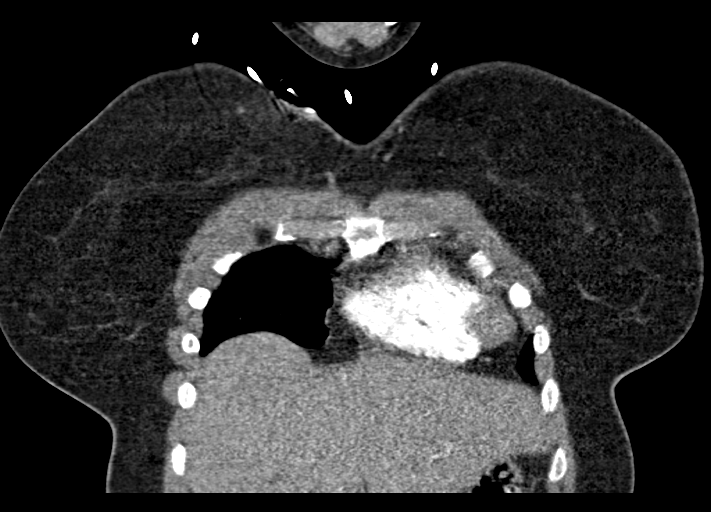
[im 65/129  soft-tissue]
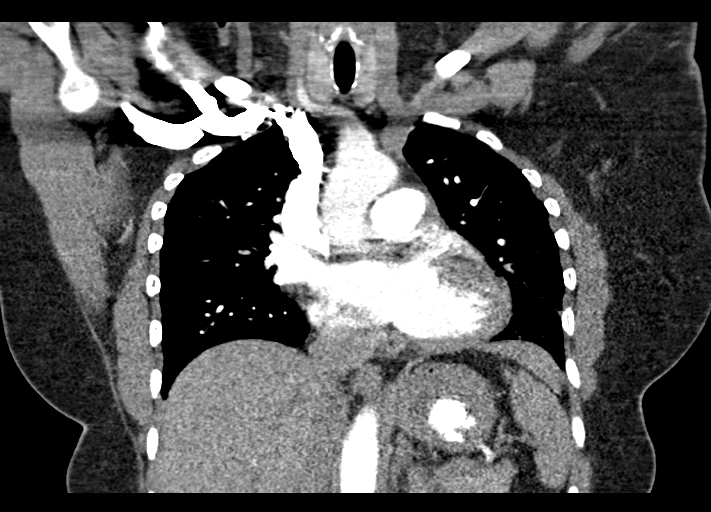
[im 97/129  soft-tissue]
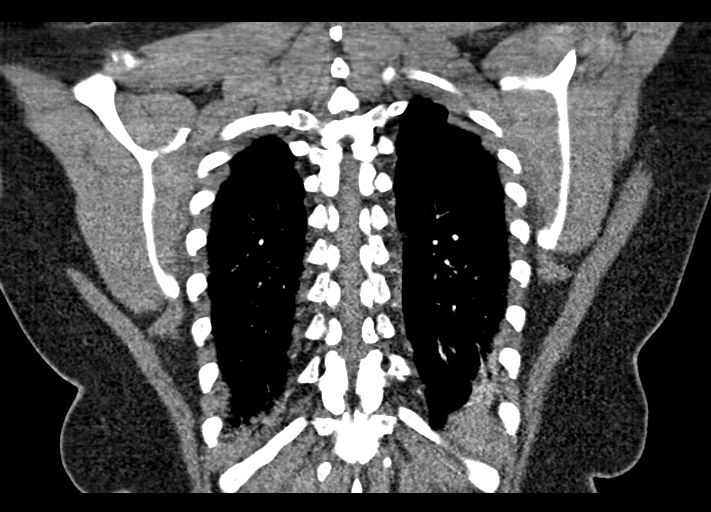

[18 of 46 positions shown; findings below may reference images not displayed]

FINDINGS: Cardiovascular: Satisfactory opacification of the pulmonary arteries
to the segmental level. No evidence of pulmonary embolism. Vascular
calcifications are seen in the aortic arch. Normal heart size. No
pericardial effusion.

Mediastinum/Nodes: No enlarged mediastinal, hilar, or axillary lymph
nodes. Thyroid gland, trachea, and esophagus demonstrate no
significant findings.

Lungs/Pleura: There is mild bilateral dependent atelectasis. No
focal consolidation, pleural effusion, or pneumothorax.

Upper Abdomen: No acute abnormality.

Musculoskeletal: No chest wall abnormality. No acute or significant
osseous findings.

Review of the MIP images confirms the above findings.
IMPRESSION: No acute pulmonary embolism.  Mild bilateral dependent atelectasis.

Aortic Atherosclerosis (C0UA5-0F5.5).

## 2022-04-30 ENCOUNTER — Other Ambulatory Visit (HOSPITAL_BASED_OUTPATIENT_CLINIC_OR_DEPARTMENT_OTHER): Payer: Self-pay | Admitting: Cardiovascular Disease

## 2022-04-30 ENCOUNTER — Other Ambulatory Visit (HOSPITAL_BASED_OUTPATIENT_CLINIC_OR_DEPARTMENT_OTHER): Payer: Self-pay

## 2022-04-30 DIAGNOSIS — Z6837 Body mass index (BMI) 37.0-37.9, adult: Secondary | ICD-10-CM

## 2022-04-30 MED ORDER — WEGOVY 1 MG/0.5ML ~~LOC~~ SOAJ
1.0000 mg | SUBCUTANEOUS | 1 refills | Status: DC
  Filled 2022-04-30: qty 2, 28d supply, fill #0
  Filled 2022-05-24: qty 2, 28d supply, fill #1

## 2022-04-30 NOTE — Telephone Encounter (Signed)
Rx(s) sent to pharmacy electronically.  

## 2022-05-15 ENCOUNTER — Other Ambulatory Visit (HOSPITAL_COMMUNITY): Payer: Self-pay

## 2022-05-15 ENCOUNTER — Other Ambulatory Visit: Payer: Self-pay | Admitting: Internal Medicine

## 2022-05-15 DIAGNOSIS — Z1231 Encounter for screening mammogram for malignant neoplasm of breast: Secondary | ICD-10-CM

## 2022-05-18 ENCOUNTER — Other Ambulatory Visit (HOSPITAL_COMMUNITY): Payer: Self-pay

## 2022-05-19 ENCOUNTER — Other Ambulatory Visit (HOSPITAL_COMMUNITY): Payer: Self-pay

## 2022-05-19 ENCOUNTER — Other Ambulatory Visit: Payer: Self-pay

## 2022-05-19 MED ORDER — ALPRAZOLAM 0.5 MG PO TABS
0.5000 mg | ORAL_TABLET | Freq: Two times a day (BID) | ORAL | 2 refills | Status: DC | PRN
  Filled 2022-05-19: qty 60, 30d supply, fill #0

## 2022-05-24 ENCOUNTER — Other Ambulatory Visit (HOSPITAL_BASED_OUTPATIENT_CLINIC_OR_DEPARTMENT_OTHER): Payer: Self-pay

## 2022-05-25 ENCOUNTER — Other Ambulatory Visit (HOSPITAL_BASED_OUTPATIENT_CLINIC_OR_DEPARTMENT_OTHER): Payer: Self-pay

## 2022-06-01 ENCOUNTER — Other Ambulatory Visit (HOSPITAL_BASED_OUTPATIENT_CLINIC_OR_DEPARTMENT_OTHER): Payer: Self-pay

## 2022-06-04 ENCOUNTER — Ambulatory Visit: Payer: Self-pay

## 2022-06-04 DIAGNOSIS — I10 Essential (primary) hypertension: Secondary | ICD-10-CM | POA: Diagnosis not present

## 2022-06-04 DIAGNOSIS — R7303 Prediabetes: Secondary | ICD-10-CM | POA: Diagnosis not present

## 2022-06-04 DIAGNOSIS — Z6837 Body mass index (BMI) 37.0-37.9, adult: Secondary | ICD-10-CM | POA: Diagnosis not present

## 2022-06-05 LAB — COMPREHENSIVE METABOLIC PANEL
ALT: 8 IU/L (ref 0–32)
AST: 16 IU/L (ref 0–40)
Albumin/Globulin Ratio: 2 (ref 1.2–2.2)
Albumin: 4.1 g/dL (ref 3.8–4.9)
Alkaline Phosphatase: 75 IU/L (ref 44–121)
BUN/Creatinine Ratio: 17 (ref 9–23)
BUN: 14 mg/dL (ref 6–24)
Bilirubin Total: 0.4 mg/dL (ref 0.0–1.2)
CO2: 24 mmol/L (ref 20–29)
Calcium: 9.2 mg/dL (ref 8.7–10.2)
Chloride: 105 mmol/L (ref 96–106)
Creatinine, Ser: 0.84 mg/dL (ref 0.57–1.00)
Globulin, Total: 2.1 g/dL (ref 1.5–4.5)
Glucose: 95 mg/dL (ref 70–99)
Potassium: 4.2 mmol/L (ref 3.5–5.2)
Sodium: 140 mmol/L (ref 134–144)
Total Protein: 6.2 g/dL (ref 6.0–8.5)
eGFR: 82 mL/min/{1.73_m2} (ref >59)

## 2022-06-05 LAB — LIPID PANEL
Chol/HDL Ratio: 3.3 ratio (ref 0.0–4.4)
Cholesterol, Total: 181 mg/dL (ref 100–199)
HDL: 55 mg/dL (ref >39)
LDL Chol Calc (NIH): 116 mg/dL — ABNORMAL HIGH (ref 0–99)
Triglycerides: 54 mg/dL (ref 0–149)
VLDL Cholesterol Cal: 10 mg/dL (ref 5–40)

## 2022-06-10 ENCOUNTER — Other Ambulatory Visit (HOSPITAL_COMMUNITY): Payer: Self-pay

## 2022-06-22 NOTE — Progress Notes (Unsigned)
Office Visit    Patient Name: Jodi Bates Date of Encounter: 06/23/2022  PCP:  Margaree Mackintosh, MD   [facility]  Cardiologist:  Chilton Si, MD  Advanced Practice Provider:  No care team member to display Electrophysiologist:  None     Chief Complaint    [name] is a 57 y.o. 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 > 5 yr.   Iron deficiency anemia    no current problems or meds.   Migraine headache with aura 04/09/2020   Morbid obesity ([facility]) 11/10/2020   Prediabetes 02/14/2021   Past Surgical History:  Procedure Laterality Date   BREAST CYST EXCISION     BREAST EXCISIONAL BIOPSY Left    BREAST SURGERY      lt breast/ benign cyst   CYST EXCISION     back of head   HYSTEROSCOPY WITH D & C  06/25/2004   INCISION AND DRAINAGE PERIRECTAL ABSCESS N/A 02/10/2013   Procedure: IRRIGATION AND DEBRIDEMENT PERIRECTAL ABSCESS;  Surgeon: Cherylynn Ridges, MD;  Location: [facility];  Service: General;  Laterality: N/A;   LAPAROSCOPIC ENDOMETRIOSIS FULGURATION     PILONIDAL CYST EXCISION N/A 04/28/2012   Procedure: exam under anesthesia and exicision of pilonidal;  Surgeon: Cherylynn Ridges, MD;  Location: [facility];  Service: General;  Laterality: N/A;   POLYPECTOMY     uterine   TUBAL LIGATION      Allergies  Allergies  Allergen Reactions   Adhesive [Tape] Itching and Other (See Comments)    Reaction:  Blisters    Latex Itching, Rash and Other (See Comments)   Soap Other (See Comments)    Soaps that  contain any kind of fragrance triggers an asthma attack.     Hydrochlorothiazide     Hypokalemia   Other Cough    Fragrances     History of Present Illness    [name] is a 57 y.o. female with a hx of aortic atherosclerosis, migraine, hyperlipidemia, obesity, prediabetes, asthma, hypertension last seen 06/03/21 by Dr. Duke Salvia.   Referred 05/2016 for bradycardia, near syncope, hypertension. Initial episode bradycardia 01/2016 in setting of taking Nebivolol which was discontinued. Furosemide switched to HCTZ for BP control and Amlodipine up titrated. At visit 02/14/21 she was started on weogfy and referred to PREP program. 02/2021 potassium was low so HCTZ was switched to Spironolactone.   Last seen 06/03/21 noting issues with vertigo. Home pressures were averaging 122/80s.   Presents today for follow-up and generally.  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  Notes feeling overall well since last seen.  She is understandably frustrated as her insurance and lower covers [employer].  Weight comes low 26-29 pounds but now up to 189 pounds.  Exercising by weight lifting but does note recent stressors have interrupted her schedule.  Eats predominantly at home and avoids beef, pork.  Avoids all caffeine and drinks predominantly water.  Notes some seasonal shortness  of breath which she attributes to her asthma for which she follows with another provider.  No chest pain, edema, orthopnea, PND.  Reports rare palpitation which is overall not bothersome.  EKGs/Labs/Other Studies Reviewed:   The following studies were reviewed today:      EKG:  EKG is ordered today.  The ekg ordered today demonstrates NSR 74 bpm with occasional PAC and no acute ST/T wave changes.  Recent Labs: 06/04/2022: ALT 8; BUN 14; Creatinine, Ser 0.84; Potassium 4.2; Sodium 140  Recent Lipid Panel    Component Value Date/Time   CHOL 181 06/04/2022 0739   TRIG 54 06/04/2022 0739   HDL 55 06/04/2022 0739   CHOLHDL  3.3 06/04/2022 0739   CHOLHDL 3.2 03/25/2015 0905   VLDL 16 03/25/2015 0905   LDLCALC 116 (H) 06/04/2022 0739   Home Medications   Current Meds  Medication Sig   albuterol (PROVENTIL) (2.5 MG/3ML) 0.083% nebulizer solution Take 3 mLs by nebulization every 6 (six) hours as needed for wheezing or shortness of breath.   albuterol (VENTOLIN HFA) 108 (90 Base) MCG/ACT inhaler Inhale 2 puffs into the lungs every 4 (four) hours as needed for wheezing or shortness of breath.   ALPRAZolam (XANAX) 0.5 MG tablet Take 1 tablet (0.5 mg total) by mouth 2 (two) times daily as needed.   amLODipine (NORVASC) 10 MG tablet Take 1 tablet (10 mg total) by mouth daily.   BREO ELLIPTA 100-25 MCG/ACT AEPB Inhale 1 puff into the lungs 1 to 7 times per week as needed   escitalopram (LEXAPRO) 20 MG tablet Take 1 tablet (20 mg total) by mouth daily.   fluticasone (FLONASE) 50 MCG/ACT nasal spray Place 1-2 sprays into both nostrils 1 to 7 times per week as needed   latanoprost (XALATAN) 0.005 % ophthalmic solution Place 1 drop into both eyes at bedtime.   losartan (COZAAR) 100 MG tablet Take 1 tablet (100 mg total) by mouth daily.   Semaglutide-Weight Management (WEGOVY) 1 MG/0.5ML SOAJ Inject 1 mg into the skin once a week. NEED APPOINTMENT   spironolactone (ALDACTONE) 25 MG tablet Take 1 tablet (25 mg total) by mouth daily.   SUMAtriptan (IMITREX) 25 MG tablet Take 1 tablet (25 mg total) by mouth every 2 (two) hours as needed for migraine. May repeat in 2 hours if headache persists or recurs.   topiramate (TOPAMAX) 25 MG tablet Take 3 tablets (75 mg total) by mouth at bedtime.     Review of Systems      All other systems reviewed and are otherwise negative except as noted above.  Physical Exam    VS:  BP 100/72   Pulse 74   Ht 5\' 4"  (1.626 m)   Wt 199 lb (90.3 kg)   LMP 06/11/2016 Comment: irregular   BMI 34.16 kg/m  , BMI Body mass index is 34.16 kg/m.  Wt Readings from Last 3 Encounters:  06/23/22 199  lb (90.3 kg)  04/21/22 192 lb (87.1 kg)  08/26/21 205 lb 12.8 oz (93.4 kg)    GEN: Well nourished, well developed, in no acute distress. HEENT: normal. Neck: Supple, no JVD, carotid bruits, or masses. Cardiac: RRR, no murmurs, rubs, or gallops. No clubbing, cyanosis, edema.  Radials/PT 2+ and equal bilaterally.  Respiratory:  Respirations regular and unlabored, clear to auscultation bilaterally. GI: Soft, nontender, nondistended. MS: No deformity or atrophy. Skin: Warm and dry, no rash. Neuro:  Strength and sensation are intact. Psych: Normal affect.  Assessment & Plan  HTN -now with relative hypotension likely related to weight loss.  Reduce losartan from 100 to 50 mg daily.  Continue amlodipine 10 mg daily, spironolactone 25 mg daily.  Check in via MyChart in 2 weeks. Discussed to monitor BP at home at least 2 hours after medications and sitting for 5-10 minutes.   Vertigo - Recommend PRN Meclizine. Educated on Teaching laboratory technician. Discussed referral to vestibular therapy which she will consider.   Obesity / Prediabetes - Insurance unfortunately does not cover [phone] for weight loss.  Will refer to healthy weight and wellness.  Update A1c to rule out diabetes as last A1c greater than 1 year ago.  Aortic atherosclerosis / Hyperlipidemia - 06/04/22 total cholesterol 181, triglycerides 54, HDL 55, LDL 116.  Questions recommend LDL goal less than 70.  Discussed lifestyle changes versus addition of low-dose rosuvastatin 3 times per week.  She prefers to trial lifestyle changes and repeat cholesterol levels in 4 months.  If LDL not improved at that time plan to add rosuvastatin 5 mg 3 times per week.          Disposition: Follow up in 1 year(s) with Chilton Si, MD or APP.  Signed, Alver Sorrow, NP 06/23/2022, 11:38 AM [facility]

## 2022-06-23 ENCOUNTER — Ambulatory Visit
Admission: RE | Admit: 2022-06-23 | Discharge: 2022-06-23 | Disposition: A | Discharge status: Home / Self Care | Payer: 59 | Source: Ambulatory Visit | Attending: Internal Medicine | Admitting: Internal Medicine

## 2022-06-23 ENCOUNTER — Ambulatory Visit (HOSPITAL_BASED_OUTPATIENT_CLINIC_OR_DEPARTMENT_OTHER): Payer: 59 | Admitting: Family

## 2022-06-23 ENCOUNTER — Encounter (HOSPITAL_BASED_OUTPATIENT_CLINIC_OR_DEPARTMENT_OTHER): Payer: Self-pay | Admitting: Family

## 2022-06-23 ENCOUNTER — Other Ambulatory Visit (HOSPITAL_COMMUNITY): Payer: Self-pay

## 2022-06-23 VITALS — BP 100/72 | HR 74 | Ht 64.0 in | Wt 199.0 lb

## 2022-06-23 DIAGNOSIS — Z6834 Body mass index (BMI) 34.0-34.9, adult: Secondary | ICD-10-CM

## 2022-06-23 DIAGNOSIS — E782 Mixed hyperlipidemia: Secondary | ICD-10-CM | POA: Diagnosis not present

## 2022-06-23 DIAGNOSIS — R739 Hyperglycemia, unspecified: Secondary | ICD-10-CM | POA: Diagnosis not present

## 2022-06-23 DIAGNOSIS — Z1231 Encounter for screening mammogram for malignant neoplasm of breast: Secondary | ICD-10-CM

## 2022-06-23 DIAGNOSIS — R42 Dizziness and giddiness: Secondary | ICD-10-CM | POA: Diagnosis not present

## 2022-06-23 DIAGNOSIS — I10 Essential (primary) hypertension: Secondary | ICD-10-CM | POA: Diagnosis not present

## 2022-06-23 DIAGNOSIS — I700 Atherosclerosis of aorta: Secondary | ICD-10-CM

## 2022-06-23 DIAGNOSIS — R7303 Prediabetes: Secondary | ICD-10-CM | POA: Diagnosis not present

## 2022-06-23 LAB — HEMOGLOBIN A1C
Est. average glucose Bld gHb Est-mCnc: 111 mg/dL
Hgb A1c MFr Bld: 5.5 % (ref 4.8–5.6)

## 2022-06-23 MED ORDER — AMLODIPINE BESYLATE 10 MG PO TABS
10.0000 mg | ORAL_TABLET | Freq: Every day | ORAL | 3 refills | Status: DC
  Filled 2022-06-23 – 2022-08-27 (×2): qty 90, 90d supply, fill #0
  Filled 2022-12-03: qty 90, 90d supply, fill #1
  Filled 2023-02-27: qty 90, 90d supply, fill #2
  Filled 2023-06-04: qty 90, 90d supply, fill #3

## 2022-06-23 MED ORDER — LOSARTAN POTASSIUM 100 MG PO TABS: 50.0000 mg | ORAL_TABLET | Freq: Every day | ORAL | 1 refills | Status: DC

## 2022-06-23 MED ORDER — SPIRONOLACTONE 25 MG PO TABS
25.0000 mg | ORAL_TABLET | Freq: Every day | ORAL | 3 refills | Status: DC
  Filled 2022-06-23 – 2022-09-19 (×2): qty 90, 90d supply, fill #0
  Filled 2023-01-16: qty 90, 90d supply, fill #1
  Filled 2023-04-13: qty 90, 90d supply, fill #2

## 2022-06-23 NOTE — Patient Instructions (Addendum)
Medication Instructions:   Your physician has recommended you make the following change in your medication:   REDUCE Losartan to half tablet daily  Start Meclizine 12.5-25mg  as needed for vertigo  *If you need a refill on your cardiac medications before your next appointment, please call your pharmacy*   Lab Work: Your physician recommends that you return for lab work today: A1c   Your physician recommends that you return for lab work in 3-4 months for fasting lipid panel   Please return for Lab work. You may come to the...   [facility] (3rd floor) [address], [place], [place] [address]  Open: 8am-Noon and 1pm-4:30pm  Please ring the doorbell on the small table when you exit the elevator and the Lab Tech will come get you  [facility] at [facility] [address], [place], [place] [address] Open: 8am-1pm, then 2pm-4:30pm   [facility]- Please see attached locations sheet stapled to your lab work with address and hours.    If you have labs (blood work) drawn today and your tests are completely normal, you will receive your results only by: MyChart Message (if you have MyChart) OR A paper copy in the mail If you have any lab test that is abnormal or we need to change your treatment, we will call you to review the results.  Follow-Up: At [facility], you and your health needs are our priority.  As part of our continuing mission to provide you with exceptional heart care, we have created designated Provider Care Teams.  These Care Teams include your primary Cardiologist (physician) and Advanced Practice Providers (APPs -  Physician Assistants and Nurse Practitioners) who all work together to provide you with the care you need, when you need it.  We recommend signing up for the patient portal called "MyChart".  Sign up information is provided on this After Visit Summary.  MyChart is used to connect with patients for Virtual  Visits (Telemedicine).  Patients are able to view lab/test results, encounter notes, upcoming appointments, etc.  Non-urgent messages can be sent to your provider as well.   To learn more about what you can do with MyChart, go to [email or web address].    Your next appointment:   1 year(s)  Provider:   Chilton Si, MD or Gillian Shields, NP    Other Instructions  [facility] at [facility] [address] [place],  [place]  [address] Main: [phone]  [facility] - Can call to schedule an appointment [address] [place], [place] [address] [phone] ________________________________   Right Start Program at [facility]  Sessions include Structured exercise sessions 2 group sessions per week for 9 weeks Monitored by fitness app 20 to 40-minute sessions Post program complications such as a neck step.  It is free for [facility] Members or $99 for non-members. Financial assistance is available.  No referral required.  Please call [phone], visit [facility] in person, or register online at [email or web address]  _______________________________  How to Perform the Epley Maneuver The Epley maneuver is an exercise that relieves symptoms of vertigo. Vertigo is the feeling that you or your surroundings are moving when they are not. When you feel vertigo, you may feel like the room is spinning and may have trouble walking. The Epley maneuver is used for a type of vertigo caused by a calcium deposit in a part of the inner ear. The maneuver involves changing head positions to help the deposit move out of  the area. You can do this maneuver at home whenever you have symptoms of vertigo. You can repeat it in 24 hours if your vertigo has not gone away. Even though the Epley maneuver may relieve your vertigo for a few weeks, it is possible that your symptoms will return. This maneuver  relieves vertigo, but it does not relieve dizziness. What are the risks? If it is done correctly, the Epley maneuver is considered safe. Sometimes it can lead to dizziness or nausea that goes away after a short time. If you develop other symptoms--such as changes in vision, weakness, or numbness--stop doing the maneuver and call your health care provider. Supplies needed: A bed or table. A pillow. How to do the Epley maneuver     Sit on the edge of a bed or table with your back straight and your legs extended or hanging over the edge of the bed or table. Turn your head halfway toward the affected ear or side as told by your health care provider. Lie backward quickly with your head turned until you are lying flat on your back. Your head should dangle (head-hanging position). You may want to position a pillow under your shoulders. Hold this position for at least 30 seconds. If you feel dizzy or have symptoms of vertigo, continue to hold the position until the symptoms stop. Turn your head to the opposite direction until your unaffected ear is facing down. Your head should continue to dangle. Hold this position for at least 30 seconds. If you feel dizzy or have symptoms of vertigo, continue to hold the position until the symptoms stop. Turn your whole body to the same side as your head so that you are positioned on your side. Your head will now be nearly facedown and no longer needs to dangle. Hold for at least 30 seconds. If you feel dizzy or have symptoms of vertigo, continue to hold the position until the symptoms stop. Sit back up. You can repeat the maneuver in 24 hours if your vertigo does not go away. Follow these instructions at home: For 24 hours after doing the Epley maneuver: Keep your head in an upright position. When lying down to sleep or rest, keep your head raised (elevated) with two or more pillows. Avoid excessive neck movements. Activity Do not drive or use machinery if you  feel dizzy. After doing the Epley maneuver, return to your normal activities as told by your health care provider. Ask your health care provider what activities are safe for you. General instructions Drink enough fluid to keep your urine pale yellow. Do not drink alcohol. Take over-the-counter and prescription medicines only as told by your health care provider. Keep all follow-up visits. This is important. Preventing vertigo symptoms Ask your health care provider if there is anything you should do at home to prevent vertigo. He or she may recommend that you: Keep your head elevated with two or more pillows while you sleep. Do not sleep on the side of your affected ear. Get up slowly from bed. Avoid sudden movements during the day. Avoid extreme head positions or movement, such as looking up or bending over. Contact a health care provider if: Your vertigo gets worse. You have other symptoms, including: Nausea. Vomiting. Headache. Get help right away if you: Have vision changes. Have a headache or neck pain that is severe or getting worse. Cannot stop vomiting. Have new numbness or weakness in any part of your body. These symptoms may represent a serious problem  that is an emergency. Do not wait to see if the symptoms will go away. Get medical help right away. Call your local emergency services (911 in the [place] Do not drive yourself to the hospital. Summary Vertigo is the feeling that you or your surroundings are moving when they are not. The Epley maneuver is an exercise that relieves symptoms of vertigo. If the Epley maneuver is done correctly, it is considered safe. This information is not intended to replace advice given to you by your health care provider. Make sure you discuss any questions you have with your health care provider. Document Revised: 12/13/2019 Document Reviewed: 12/13/2019 Elsevier Patient Education  2024 [employer].

## 2022-07-07 ENCOUNTER — Encounter (HOSPITAL_BASED_OUTPATIENT_CLINIC_OR_DEPARTMENT_OTHER): Payer: Self-pay

## 2022-08-27 ENCOUNTER — Other Ambulatory Visit (HOSPITAL_COMMUNITY): Payer: Self-pay

## 2022-09-19 ENCOUNTER — Other Ambulatory Visit (HOSPITAL_COMMUNITY): Payer: Self-pay

## 2022-09-20 ENCOUNTER — Other Ambulatory Visit: Payer: Self-pay

## 2022-09-21 ENCOUNTER — Other Ambulatory Visit (HOSPITAL_COMMUNITY): Payer: Self-pay

## 2022-09-21 MED ORDER — ESCITALOPRAM OXALATE 20 MG PO TABS
20.0000 mg | ORAL_TABLET | Freq: Every day | ORAL | 0 refills | Status: DC
  Filled 2022-09-21: qty 90, 90d supply, fill #0

## 2022-09-22 DIAGNOSIS — H40013 Open angle with borderline findings, low risk, bilateral: Secondary | ICD-10-CM | POA: Diagnosis not present

## 2022-10-13 DIAGNOSIS — Z6836 Body mass index (BMI) 36.0-36.9, adult: Secondary | ICD-10-CM | POA: Diagnosis not present

## 2022-10-13 DIAGNOSIS — R8290 Unspecified abnormal findings in urine: Secondary | ICD-10-CM | POA: Diagnosis not present

## 2022-10-13 DIAGNOSIS — Z01419 Encounter for gynecological examination (general) (routine) without abnormal findings: Secondary | ICD-10-CM | POA: Diagnosis not present

## 2022-10-17 ENCOUNTER — Other Ambulatory Visit (HOSPITAL_COMMUNITY): Payer: Self-pay

## 2022-10-17 ENCOUNTER — Encounter (HOSPITAL_BASED_OUTPATIENT_CLINIC_OR_DEPARTMENT_OTHER): Payer: Self-pay

## 2022-10-19 ENCOUNTER — Other Ambulatory Visit (HOSPITAL_COMMUNITY): Payer: Self-pay

## 2022-10-19 MED ORDER — LOSARTAN POTASSIUM 50 MG PO TABS
50.0000 mg | ORAL_TABLET | Freq: Every day | ORAL | 3 refills | Status: DC
  Filled 2022-10-19: qty 90, 90d supply, fill #0
  Filled 2023-01-16: qty 90, 90d supply, fill #1
  Filled 2023-04-13: qty 90, 90d supply, fill #2

## 2022-10-26 DIAGNOSIS — E782 Mixed hyperlipidemia: Secondary | ICD-10-CM | POA: Diagnosis not present

## 2022-10-26 DIAGNOSIS — I700 Atherosclerosis of aorta: Secondary | ICD-10-CM | POA: Diagnosis not present

## 2022-10-27 ENCOUNTER — Encounter (HOSPITAL_BASED_OUTPATIENT_CLINIC_OR_DEPARTMENT_OTHER): Payer: Self-pay

## 2022-10-27 DIAGNOSIS — E782 Mixed hyperlipidemia: Secondary | ICD-10-CM

## 2022-10-27 LAB — LIPID PANEL
Chol/HDL Ratio: 2.7 {ratio} (ref 0.0–4.4)
Cholesterol, Total: 185 mg/dL (ref 100–199)
HDL: 68 mg/dL (ref >39)
LDL Chol Calc (NIH): 107 mg/dL — ABNORMAL HIGH (ref 0–99)
Triglycerides: 50 mg/dL (ref 0–149)
VLDL Cholesterol Cal: 10 mg/dL (ref 5–40)

## 2022-11-03 ENCOUNTER — Other Ambulatory Visit (HOSPITAL_COMMUNITY): Payer: Self-pay

## 2022-11-11 ENCOUNTER — Telehealth: Payer: 59 | Admitting: Nurse Practitioner

## 2022-11-11 ENCOUNTER — Other Ambulatory Visit (HOSPITAL_COMMUNITY): Payer: Self-pay

## 2022-11-11 DIAGNOSIS — J069 Acute upper respiratory infection, unspecified: Secondary | ICD-10-CM

## 2022-11-11 MED ORDER — BENZONATATE 100 MG PO CAPS
100.0000 mg | ORAL_CAPSULE | Freq: Three times a day (TID) | ORAL | 0 refills | Status: DC | PRN
  Filled 2022-11-11: qty 30, 10d supply, fill #0

## 2022-11-11 MED ORDER — PREDNISONE 20 MG PO TABS
20.0000 mg | ORAL_TABLET | Freq: Two times a day (BID) | ORAL | 0 refills | Status: AC
  Filled 2022-11-11: qty 10, 5d supply, fill #0

## 2022-11-11 NOTE — Progress Notes (Signed)
E-Visit for Cough   We are sorry that you are not feeling well.  Here is how we plan to help!  Based on your presentation I believe you most likely have A cough due to a virus.  This is called viral bronchitis and is best treated by rest, plenty of fluids and control of the cough.  You may use Ibuprofen or Tylenol as directed to help your symptoms.     In addition you may use A prescription cough medication called Tessalon Perles 100mg . You may take 1-2 capsules every 8 hours as needed for your cough.  Prednisone 20 mg twice daily for 5 days (take with food)  From your responses in the eVisit questionnaire you describe inflammation in the upper respiratory tract which is causing a significant cough.  This is commonly called Bronchitis and has four common causes:   Allergies Viral Infections Acid Reflux Bacterial Infection Allergies, viruses and acid reflux are treated by controlling symptoms or eliminating the cause. An example might be a cough caused by taking certain blood pressure medications. You stop the cough by changing the medication. Another example might be a cough caused by acid reflux. Controlling the reflux helps control the cough.  USE OF BRONCHODILATOR ("RESCUE") INHALERS: There is a risk from using your bronchodilator too frequently.  The risk is that over-reliance on a medication which only relaxes the muscles surrounding the breathing tubes can reduce the effectiveness of medications prescribed to reduce swelling and congestion of the tubes themselves.  Although you feel brief relief from the bronchodilator inhaler, your asthma may actually be worsening with the tubes becoming more swollen and filled with mucus.  This can delay other crucial treatments, such as oral steroid medications. If you need to use a bronchodilator inhaler daily, several times per day, you should discuss this with your provider.  There are probably better treatments that could be used to keep your asthma  under control.     HOME CARE Only take medications as instructed by your medical team. Complete the entire course of an antibiotic. Drink plenty of fluids and get plenty of rest. Avoid close contacts especially the very young and the elderly Cover your mouth if you cough or cough into your sleeve. Always remember to wash your hands A steam or ultrasonic humidifier can help congestion.   GET HELP RIGHT AWAY IF: You develop worsening fever. You become short of breath You cough up blood. Your symptoms persist after you have completed your treatment plan MAKE SURE YOU  Understand these instructions. Will watch your condition. Will get help right away if you are not doing well or get worse.    Thank you for choosing an e-visit.  Your e-visit answers were reviewed by a board certified advanced clinical practitioner to complete your personal care plan. Depending upon the condition, your plan could have included both over the counter or prescription medications.  Please review your pharmacy choice. Make sure the pharmacy is open so you can pick up prescription now. If there is a problem, you may contact your provider through [employer] and have the prescription routed to another pharmacy.  Your safety is important to [place]. If you have drug allergies check your prescription carefully.   For the next 24 hours you can use MyChart to ask questions about today's visit, request a non-urgent call back, or ask for a work or school excuse. You will get an email in the next two days asking about your experience. I hope  that your e-visit has been valuable and will speed your recovery.   Meds ordered this encounter  Medications   predniSONE (DELTASONE) 20 MG tablet    Sig: Take 1 tablet (20 mg total) by mouth 2 (two) times daily with a meal for 5 days.    Dispense:  10 tablet    Refill:  0   benzonatate (TESSALON) 100 MG capsule    Sig: Take 1 capsule (100 mg total) by mouth 3 (three) times  daily as needed.    Dispense:  30 capsule    Refill:  0    I spent approximately 5 minutes reviewing the patient's history, current symptoms and coordinating their care today.

## 2022-12-15 ENCOUNTER — Other Ambulatory Visit (HOSPITAL_COMMUNITY): Payer: Self-pay

## 2022-12-15 ENCOUNTER — Encounter: Payer: Self-pay | Admitting: Internal Medicine

## 2022-12-15 ENCOUNTER — Ambulatory Visit (INDEPENDENT_AMBULATORY_CARE_PROVIDER_SITE_OTHER): Payer: 59 | Admitting: Internal Medicine

## 2022-12-15 VITALS — BP 102/80 | HR 74 | Temp 98.3°F | Ht 64.0 in | Wt 199.0 lb

## 2022-12-15 DIAGNOSIS — J029 Acute pharyngitis, unspecified: Secondary | ICD-10-CM | POA: Diagnosis not present

## 2022-12-15 DIAGNOSIS — J040 Acute laryngitis: Secondary | ICD-10-CM | POA: Diagnosis not present

## 2022-12-15 DIAGNOSIS — H6501 Acute serous otitis media, right ear: Secondary | ICD-10-CM | POA: Diagnosis not present

## 2022-12-15 LAB — POCT RAPID STREP A (OFFICE): Rapid Strep A Screen: NEGATIVE

## 2022-12-15 MED ORDER — DOXYCYCLINE HYCLATE 100 MG PO TABS
100.0000 mg | ORAL_TABLET | Freq: Two times a day (BID) | ORAL | 0 refills | Status: DC
  Filled 2022-12-15: qty 20, 10d supply, fill #0
  Filled 2022-12-15: qty 7, 7d supply, fill #0

## 2022-12-15 MED ORDER — HYDROCODONE BIT-HOMATROP MBR 5-1.5 MG/5ML PO SOLN
5.0000 mL | Freq: Three times a day (TID) | ORAL | 0 refills | Status: DC | PRN
  Filled 2022-12-15: qty 120, 8d supply, fill #0

## 2022-12-15 MED ORDER — TERCONAZOLE 0.4 % VA CREA
1.0000 | TOPICAL_CREAM | Freq: Every day | VAGINAL | 0 refills | Status: DC
  Filled 2022-12-15: qty 45, 7d supply, fill #0

## 2022-12-15 NOTE — Progress Notes (Signed)
Patient Care Team: Margaree Mackintosh, MD as PCP - General (Internal Medicine) Chilton Si, MD as PCP - Cardiology (Cardiology) Linna Darner, RD as Dietitian (Family Medicine)  Visit Date: 12/15/22  Subjective:    Patient ID: Jodi Bates , Female   DOB: 1965-02-06, 57 y.o.    MRN: [id number]   57 y.o. Female presents today for cough with white/green sputum, congestion, hoarseness since 12/07/22. [place] the beach where it was colder from 12/05/22 - 12/09/22. Denies eye drainage, ear pain. She is sleeping well. Taking ibuprofen with some relief. Seen by Viviano Simas FNP on 11/11/22 for viral URI and given Tessalon, prednisone. Stopped taking prednisone after one dose due to sleepiness.  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 > 5 yr.   Iron deficiency anemia    no current problems or meds.   Migraine headache with aura 04/09/2020   Morbid obesity (HCC) 11/10/2020   Prediabetes 02/14/2021     Family History  Problem Relation Age of Onset   Diabetes Mother    Hypertension Mother    Allergic rhinitis Mother    Heart failure Mother    Cancer Father        deceased metastaic throat cancer   Esophageal cancer Father    Diabetes Brother    Pancreatic cancer Brother    Asthma Brother    Sleep apnea Brother    Cancer Maternal Grandmother        breast   Breast cancer Maternal Grandmother    Allergic rhinitis Son    Asthma Son    Colon cancer Neg Hx    Rectal cancer Neg Hx    Stomach cancer Neg Hx    Colon polyps Neg Hx     Social History   Social History Narrative   Lives with husband   Right Handed   Drinks no  caffeine      Review of Systems  Constitutional:  Negative for fever and malaise/fatigue.  HENT:  Positive for congestion. Negative for ear pain.        (+) Hoarseness  Eyes:  Negative for blurred vision and discharge.  Respiratory:  Positive for cough and sputum production (White/green). Negative for shortness of breath.   Cardiovascular:  Negative for chest pain, palpitations and leg swelling.  Gastrointestinal:  Negative for vomiting.  Musculoskeletal:  Negative for back pain.  Skin:  Negative for rash.  Neurological:  Negative for loss of consciousness and headaches.        Objective:   Vitals: BP 102/80   Pulse 74   Temp 98.3 F (36.8 C)   Ht 5\' 4"  (1.626 m)   Wt 199 lb (90.3 kg)   LMP 06/11/2016 Comment: irregular   SpO2 97%   BMI 34.16 kg/m    Physical Exam Vitals and nursing note reviewed.  Constitutional:      General: She is not in acute distress.    Appearance: Normal appearance. She is not toxic-appearing.  HENT:     Head: Normocephalic and atraumatic.  Right Ear: Hearing, ear canal and external ear normal.     Left Ear: Hearing, tympanic membrane, ear canal and external ear normal.     Ears:     Comments: Right TM full, no erythema.     Mouth/Throat:     Pharynx: Oropharynx is clear. No oropharyngeal exudate.  Pulmonary:     Effort: Pulmonary effort is normal. No respiratory distress.     Breath sounds: Normal breath sounds. No wheezing or rales.  Skin:    General: Skin is warm and dry.  Neurological:     Mental Status: She is alert and oriented to person, place, and time. Mental status is at baseline.  Psychiatric:        Mood and Affect: Mood normal.        Behavior: Behavior normal.        Thought Content: Thought content normal.        Judgment: Judgment normal.       Results:   Studies obtained and personally reviewed by me:   Labs:       Component Value Date/Time   NA 140 06/04/2022 0739   K 4.2 06/04/2022 0739   CL 105  06/04/2022 0739   CO2 24 06/04/2022 0739   GLUCOSE 95 06/04/2022 0739   GLUCOSE 123 (H) 02/01/2021 1308   BUN 14 06/04/2022 0739   CREATININE 0.84 06/04/2022 0739   CREATININE 0.73 12/14/2019 1602   CALCIUM 9.2 06/04/2022 0739   PROT 6.2 06/04/2022 0739   ALBUMIN 4.1 06/04/2022 0739   AST 16 06/04/2022 0739   ALT 8 06/04/2022 0739   ALKPHOS 75 06/04/2022 0739   BILITOT 0.4 06/04/2022 0739   GFRNONAA >60 02/01/2021 1308   GFRNONAA 93 12/14/2019 1602   GFRAA 108 12/14/2019 1602     Lab Results  Component Value Date   WBC 5.6 02/01/2021   HGB 14.0 02/01/2021   HCT 42.0 02/01/2021   MCV 90.9 02/01/2021   PLT 198 02/01/2021    Lab Results  Component Value Date   CHOL 185 10/26/2022   HDL 68 10/26/2022   LDLCALC 107 (H) 10/26/2022   TRIG 50 10/26/2022   CHOLHDL 2.7 10/26/2022    Lab Results  Component Value Date   HGBA1C 5.5 06/23/2022     Lab Results  Component Value Date   TSH 2.11 03/25/2015      Assessment & Plan:   Laryngitis / right serous otitis media: strep test negative. Prescribed levofloxacin 500 mg daily, terconazole 0.4% vaginal cream, Hycodan syrup 1 tsp every eight hours as needed for cough. Get plenty of rest and stay well-hydrated. Contact us if symptoms worsen or fail to improve.    I,Alexander Ruley,acting as a Neurosurgeon for Margaree Mackintosh, MD.,have documented all relevant documentation on the behalf of Margaree Mackintosh, MD,as directed by  Margaree Mackintosh, MD while in the presence of Margaree Mackintosh, MD.   I, Margaree Mackintosh, MD, have reviewed all documentation for this visit. The documentation on 12/25/22 for the exam, diagnosis, procedures, and orders are all accurate and complete.

## 2022-12-16 ENCOUNTER — Other Ambulatory Visit (HOSPITAL_COMMUNITY): Payer: Self-pay

## 2022-12-16 MED ORDER — ESCITALOPRAM OXALATE 20 MG PO TABS
20.0000 mg | ORAL_TABLET | Freq: Every day | ORAL | 3 refills | Status: DC
  Filled 2022-12-16: qty 90, 90d supply, fill #0
  Filled 2023-03-27: qty 90, 90d supply, fill #1
  Filled 2023-07-06: qty 90, 90d supply, fill #2
  Filled 2023-08-20 – 2023-10-02 (×2): qty 90, 90d supply, fill #3

## 2022-12-25 ENCOUNTER — Encounter: Payer: Self-pay | Admitting: Internal Medicine

## 2022-12-25 NOTE — Patient Instructions (Addendum)
Rapid strep screen is negative. Take Doxycycline 100 mg twice daily for 10 days.May take Hycodan as needed for cough. Rest and stay well hydrated. Please call if not improving within a few days. Terazol cream has been refilled should you need it for Candida vaginitis.

## 2023-01-18 ENCOUNTER — Encounter: Payer: Self-pay | Admitting: Internal Medicine

## 2023-01-19 DIAGNOSIS — E782 Mixed hyperlipidemia: Secondary | ICD-10-CM | POA: Diagnosis not present

## 2023-01-19 LAB — HEPATIC FUNCTION PANEL
ALT: 21 [IU]/L (ref 0–32)
AST: 25 [IU]/L (ref 0–40)
Albumin: 4.2 g/dL (ref 3.8–4.9)
Alkaline Phosphatase: 84 [IU]/L (ref 44–121)
Bilirubin Total: 0.5 mg/dL (ref 0.0–1.2)
Bilirubin, Direct: 0.17 mg/dL (ref 0.00–0.40)
Total Protein: 6.2 g/dL (ref 6.0–8.5)

## 2023-01-19 LAB — LIPID PANEL
Chol/HDL Ratio: 3.2 {ratio} (ref 0.0–4.4)
Cholesterol, Total: 178 mg/dL (ref 100–199)
HDL: 55 mg/dL (ref >39)
LDL Chol Calc (NIH): 108 mg/dL — ABNORMAL HIGH (ref 0–99)
Triglycerides: 82 mg/dL (ref 0–149)
VLDL Cholesterol Cal: 15 mg/dL (ref 5–40)

## 2023-01-22 ENCOUNTER — Other Ambulatory Visit (HOSPITAL_COMMUNITY): Payer: Self-pay

## 2023-01-22 ENCOUNTER — Encounter (HOSPITAL_BASED_OUTPATIENT_CLINIC_OR_DEPARTMENT_OTHER): Payer: Self-pay

## 2023-01-22 DIAGNOSIS — E782 Mixed hyperlipidemia: Secondary | ICD-10-CM

## 2023-01-22 MED ORDER — ROSUVASTATIN CALCIUM 5 MG PO TABS
5.0000 mg | ORAL_TABLET | ORAL | 1 refills | Status: DC
  Filled 2023-01-22: qty 36, 84d supply, fill #0

## 2023-01-23 ENCOUNTER — Other Ambulatory Visit (HOSPITAL_COMMUNITY): Payer: Self-pay

## 2023-02-16 ENCOUNTER — Ambulatory Visit (INDEPENDENT_AMBULATORY_CARE_PROVIDER_SITE_OTHER): Payer: Commercial Managed Care - PPO | Admitting: Physician Assistant

## 2023-02-16 ENCOUNTER — Encounter (INDEPENDENT_AMBULATORY_CARE_PROVIDER_SITE_OTHER): Payer: Self-pay | Admitting: Physician Assistant

## 2023-02-16 VITALS — BP 127/85 | HR 68 | Temp 98.6°F | Ht 64.0 in | Wt 229.0 lb

## 2023-02-16 DIAGNOSIS — I10 Essential (primary) hypertension: Secondary | ICD-10-CM | POA: Diagnosis not present

## 2023-02-16 DIAGNOSIS — D509 Iron deficiency anemia, unspecified: Secondary | ICD-10-CM | POA: Diagnosis not present

## 2023-02-16 DIAGNOSIS — R7303 Prediabetes: Secondary | ICD-10-CM | POA: Diagnosis not present

## 2023-02-16 DIAGNOSIS — R5383 Other fatigue: Secondary | ICD-10-CM | POA: Diagnosis not present

## 2023-02-16 DIAGNOSIS — Z780 Asymptomatic menopausal state: Secondary | ICD-10-CM | POA: Diagnosis not present

## 2023-02-16 DIAGNOSIS — E66812 Obesity, class 2: Secondary | ICD-10-CM | POA: Diagnosis not present

## 2023-02-16 DIAGNOSIS — Z0289 Encounter for other administrative examinations: Secondary | ICD-10-CM

## 2023-02-16 DIAGNOSIS — G43109 Migraine with aura, not intractable, without status migrainosus: Secondary | ICD-10-CM

## 2023-02-16 DIAGNOSIS — L732 Hidradenitis suppurativa: Secondary | ICD-10-CM | POA: Diagnosis not present

## 2023-02-16 DIAGNOSIS — Z8659 Personal history of other mental and behavioral disorders: Secondary | ICD-10-CM | POA: Diagnosis not present

## 2023-02-16 DIAGNOSIS — Z8709 Personal history of other diseases of the respiratory system: Secondary | ICD-10-CM

## 2023-02-16 DIAGNOSIS — Z6839 Body mass index (BMI) 39.0-39.9, adult: Secondary | ICD-10-CM | POA: Diagnosis not present

## 2023-02-16 NOTE — Progress Notes (Signed)
Office: [phone]  /  Fax: [phone]   Initial Visit  Jodi Bates was seen in clinic today to evaluate for obesity. She is interested in losing weight to improve overall health and reduce the risk of weight related complications. She presents today to review program treatment options, initial physical assessment, and evaluation.     She was referred by: PCP  [employer] employee- Was on We[employer], but lost coverage.   When asked what else they would like to accomplish? She states: Adopt healthier eating patterns, Improve energy levels and physical activity, Improve existing medical conditions, Reduce number of medications, Reduce risk for a surgery, Improve quality of life, Improve appearance, Improve self-confidence, and Lose a target amount of weight : goal of 160 lbs lbs in 9-12 months.  Weight history: Weight gain after pregnancies and with aging/menopause.   When asked how has your weight affected you? She states: Has affected self-esteem, Contributed to medical problems, Having fatigue, Problems with eating patterns, and Has affected mood   Some associated conditions: Hypertension, Hyperlipidemia, GERD, Lung disease, and Vitamin D Deficiency  Contributing factors: Family history of obesity, Consumption of processed foods, Moderate to high levels of stress, Reduced physical activity, Eating patterns, Menopause, and Slow metabolism for age  Weight promoting medications identified: None  Current nutrition plan: None  Current level of physical activity: None and Walking 30-60 minutes  Current or previous pharmacotherapy: GLP-1- [facility] but lost coverage after 1 year of treatment  Response to medication: Lost weight initially but was unable to sustain weight loss  as lost insurance coverage for [employer].  Weight loss from 219 to 189 lbs while on Wegovy.    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 > 5 yr.   Iron deficiency anemia    no current problems or meds.   Migraine headache with aura 04/09/2020   Morbid obesity (HCC) 11/10/2020   Prediabetes 02/14/2021     Objective:   BP 127/85   Pulse 68   Temp 98.6 F (37 C)   Ht 5\' 4"  (1.626 m)   Wt 229 lb (103.9 kg)   LMP 06/11/2016 Comment: irregular   SpO2 99%   BMI 39.31 kg/m  She was weighed on the bioimpedance scale: Body mass index is 39.31 kg/m.  Peak Weight:247 lbs , Body Fat%:46.4%, Visceral Fat Rating:14, Weight trend over the last 12 months: Increasing  General:  Alert, oriented and cooperative. Patient is in no acute distress.  Respiratory: Normal respiratory effort, no problems with respiration noted   Gait: able to ambulate independently  Mental Status: Normal mood and affect. Normal behavior. Normal judgment and thought content.   DIAGNOSTIC DATA REVIEWED:  BMET    Component Value Date/Time   NA 140 06/04/2022 0739   K 4.2 06/04/2022 0739   CL 105 06/04/2022 0739   CO2 24 06/04/2022 0739   GLUCOSE 95 06/04/2022 0739   GLUCOSE 123 (H) 02/01/2021 1308   BUN 14 06/04/2022 0739   CREATININE 0.84 06/04/2022 0739   CREATININE 0.73 12/14/2019 1602  CALCIUM 9.2 06/04/2022 0739   GFRNONAA >60 02/01/2021 1308   GFRNONAA 93 12/14/2019 1602   GFRAA 108 12/14/2019 1602   Lab Results  Component Value Date   HGBA1C 5.5 06/23/2022   HGBA1C 5.9 (H) 12/29/2011   No results found for: "INSULIN" CBC    Component Value Date/Time   WBC 5.6 02/01/2021 1308   RBC 4.62 02/01/2021 1308   HGB 14.0 02/01/2021 1308   HGB 12.2 10/16/2008 0914   HCT 42.0 02/01/2021 1308   HCT 36.1 10/16/2008 0914   PLT 198 02/01/2021  1308   PLT 183 10/16/2008 0914   MCV 90.9 02/01/2021 1308   MCV 86.4 10/16/2008 0914   MCH 30.3 02/01/2021 1308   MCHC 33.3 02/01/2021 1308   RDW 12.6 02/01/2021 1308   RDW 13.9 10/16/2008 0914   Iron/TIBC/Ferritin/ %Sat    Component Value Date/Time   IRON 98 12/29/2011 1654   TIBC 369 12/29/2011 1654   FERRITIN 11 10/16/2008 0914   IRONPCTSAT 27 12/29/2011 1654   Lipid Panel     Component Value Date/Time   CHOL 178 01/19/2023 1121   TRIG 82 01/19/2023 1121   HDL 55 01/19/2023 1121   CHOLHDL 3.2 01/19/2023 1121   CHOLHDL 3.2 03/25/2015 0905   VLDL 16 03/25/2015 0905   LDLCALC 108 (H) 01/19/2023 1121   Hepatic Function Panel     Component Value Date/Time   PROT 6.2 01/19/2023 1121   ALBUMIN 4.2 01/19/2023 1121   AST 25 01/19/2023 1121   ALT 21 01/19/2023 1121   ALKPHOS 84 01/19/2023 1121   BILITOT 0.5 01/19/2023 1121   BILIDIR 0.17 01/19/2023 1121      Component Value Date/Time   TSH 2.11 03/25/2015 0905     Assessment and Plan:   History of asthma  Essential hypertension  History of anxiety  Iron deficiency anemia, unspecified iron deficiency anemia type  Fatigue, unspecified type  Prediabetes  Menopause  Hidradenitis suppurativa of right axilla  Migraine with aura and without status migrainosus, not intractable  Class 2 severe obesity due to excess calories with serious comorbidity and body mass index (BMI) of 39.0 to 39.9 in adult [facility])        Obesity Treatment / Action Plan:  Patient will work on garnering support from family and friends to begin weight loss journey. Will work on eliminating or reducing the presence of highly palatable, calorie dense foods in the home. Will complete provided nutritional and psychosocial assessment questionnaire before the next appointment. Will be scheduled for indirect calorimetry to determine resting energy expenditure in a fasting state.  This will allow [place] to create a reduced calorie, high-protein  meal plan to promote loss of fat mass while preserving muscle mass. Will think about ideas on how to incorporate physical activity into their daily routine. Will avoid skipping meals which may result in increased hunger signals and overeating at certain times. Will reduce the frequency of eating out and making healthier choices by advanced menu planning. Will work on managing stress via relaxation methods as this may result in unhealthy eating patterns. Counseled on the health benefits of losing 5%-15% of total body weight. Will work on improving sleep hygiene and trying to obtain at least 7 hours of sleep. Was counseled on nutritional approaches to weight loss and benefits of reducing processed foods and consuming plant-based foods and high quality protein as part of nutritional weight management. Was counseled on pharmacotherapy and role as an adjunct in weight management.   Obesity  Education Performed Today:  She was weighed on the bioimpedance scale and results were discussed and documented in the synopsis.  We discussed obesity as a disease and the importance of a more detailed evaluation of all the factors contributing to the disease.  We discussed the importance of long term lifestyle changes which include nutrition, exercise and behavioral modifications as well as the importance of customizing this to her specific health and social needs.  We discussed the benefits of reaching a healthier weight to alleviate the symptoms of existing conditions and reduce the risks of the biomechanical, metabolic and psychological effects of obesity.  Jodi Bates appears to be in the action stage of change and states they are ready to start intensive lifestyle modifications and behavioral modifications.  30 minutes was spent today on this visit including the above counseling, pre-visit chart review, and post-visit documentation.  Reviewed by clinician on day of visit: allergies, medications,  problem list, medical history, surgical history, family history, social history, and previous encounter notes pertinent to obesity diagnosis.   Amariss Detamore,PA-C

## 2023-02-27 ENCOUNTER — Other Ambulatory Visit (HOSPITAL_COMMUNITY): Payer: Self-pay

## 2023-03-16 ENCOUNTER — Encounter (INDEPENDENT_AMBULATORY_CARE_PROVIDER_SITE_OTHER): Payer: Self-pay | Admitting: Family Medicine

## 2023-03-16 ENCOUNTER — Ambulatory Visit (INDEPENDENT_AMBULATORY_CARE_PROVIDER_SITE_OTHER): Payer: Commercial Managed Care - PPO | Admitting: Family Medicine

## 2023-03-16 VITALS — BP 122/78 | HR 53 | Temp 98.0°F | Ht 64.0 in | Wt 229.0 lb

## 2023-03-16 DIAGNOSIS — R5383 Other fatigue: Secondary | ICD-10-CM

## 2023-03-16 DIAGNOSIS — Z6839 Body mass index (BMI) 39.0-39.9, adult: Secondary | ICD-10-CM

## 2023-03-16 DIAGNOSIS — R0602 Shortness of breath: Secondary | ICD-10-CM

## 2023-03-16 DIAGNOSIS — Z1331 Encounter for screening for depression: Secondary | ICD-10-CM

## 2023-03-16 DIAGNOSIS — E7849 Other hyperlipidemia: Secondary | ICD-10-CM | POA: Diagnosis not present

## 2023-03-16 DIAGNOSIS — I10 Essential (primary) hypertension: Secondary | ICD-10-CM | POA: Diagnosis not present

## 2023-03-16 DIAGNOSIS — E66812 Obesity, class 2: Secondary | ICD-10-CM

## 2023-03-16 DIAGNOSIS — Z8709 Personal history of other diseases of the respiratory system: Secondary | ICD-10-CM | POA: Diagnosis not present

## 2023-03-16 DIAGNOSIS — R7303 Prediabetes: Secondary | ICD-10-CM

## 2023-03-16 NOTE — Progress Notes (Signed)
Carlye Grippe, D.O.  ABFM, ABOM Specializing in Clinical Bariatric Medicine Office located at: [address]  [place], [place]  [address]   Bariatric Medicine Visit  Dear Lenord Fellers, Luanna Cole, MD   Thank you for referring Jodi Bates to our clinic today for evaluation.  We performed a consultation to discuss her options for treatment and educate the patient on her disease state.  The following note includes my evaluation and treatment recommendations.   Please do not hesitate to reach out to me directly if you have any further concerns.   Assessment and Plan:   FOR THE DISEASE OF OBESITY: Class 2 severe obesity with serious comorbidity and body mass index (BMI) of 39.0 to 39.9 in adult, unspecified obesity type [facility]) Assessment & Plan: Jodi Bates is currently in the action stage of change. As such, her goal is to start our weight management plan.  She has agreed to: follow the Category 2 plan.   Behavioral Intervention We discussed the following today: begin to work on maintaining a reduced calorie state, getting the recommended amount of protein, incorporating whole foods, making healthy choices, staying well hydrated and practicing mindfulness when eating.  Additional resources provided today: handout on CAT 2 meal plan  Evidence-based interventions for health behavior change were utilized today including the discussion of self monitoring techniques, problem-solving barriers and SMART goal setting techniques.    Pt will specifically work on: n/a   Recommended Physical Activity Goals Rital has been advised to work up to 150 minutes of moderate intensity aerobic activity a week and strengthening exercises 2-3 times per week for cardiovascular health, weight loss maintenance and preservation of muscle mass.   She has agreed to : maintain current level of activity.    Pharmacotherapy We both agreed to : begin with nutritional and behavioral strategies   FOR  ASSOCIATED CONDITIONS ADDRESSED TODAY:  Fatigue Assessment & Plan: Jodi Bates does feel that her weight is causing her energy to be lower than it should be. Fatigue may be related to obesity, depression or many other causes. she does not appear to have any red flag symptoms and this appears to most likely be related to her current lifestyle habits and dietary intake.  Labs will be ordered and reviewed with her at their next office visit in two weeks.  Epworth sleepiness scale is 3 and appears to be within normal limits. Jodi Bates denies daytime somnolence and admits to waking up still tired. Jodi Bates generally gets about 5-7  hours of sleep per night, and states that she has generally restful sleep. Encouraged pt to aim for 7-9 hrs of sleep per night for the best weight loss results and for her overall health/well being. Snoring is present. Apneic episodes are not present.   ECG done on 06/23/22: Reviewed/ interpreted independently.  Normal sinus rhythm w/ low voltage and a premature atrial contraction. Rate 75 bmp. Will continue to monitor for symptoms   Orders:  -     Vitamin B12 -     T4, free -     T3 -     CBC with Differential/Platelet -     Folate -     Hemoglobin A1c -     Insulin, random -     VITAMIN D 25 Hydroxy (Vit-D Deficiency, Fractures) -     TSH -     Basic metabolic panel   Shortness of breath on exertion Assessment & Plan: Jodi Bates does feel that she gets out of breath  more easily than she used to when she exercises and seems to be worsening over time with weight gain.  This has gotten worse recently. [employer] denies shortness of breath at rest or orthopnea. Jodi Bates's shortness of breath appears to be obesity related and exercise induced, as they do not appear to have any "red flag" symptoms/ concerns today.  Also, this condition appears to be related to a state of poor cardiovascular conditioning   Obtain labs today and will be reviewed with her at their next office visit in two  weeks.  Indirect Calorimeter completed today to help guide our dietary regimen. It shows a VO2 of 276 and a REE of 1901.  Her calculated basal metabolic rate is 3474 thus her resting energy expenditure is better than expected.  Patient agreed to work on weight loss at this time.  As Trachelle progresses through our weight loss program, we will gradually increase exercise as tolerated to treat her current condition.   If Javaya follows our recommendations and loses 5-10% of their weight without improvement of her shortness of breath or if at any time, symptoms become more concerning, they agree to urgently follow up with their PCP/ specialist for further consideration/ evaluation.   Jodi Bates verbalizes agreement with this plan.    Depression Screen  Assessment & Plan: Her Food and Mood (modified PHQ-9) score was negative at 4. Pt does not feel that her emotions drive her eating habits. Pt was informed about our bariatric psychologist - will make referral if needed in the future.    Essential hypertension Assessment & Plan: Last 3 blood pressure readings in our office are as follows: BP Readings from Last 3 Encounters:  03/16/23 122/78  02/16/23 127/85  12/15/22 102/80   Pt diagnosed with HTN in her late 30s to early 54s. HTN managed with Spironolactone, Amlodipine, & Losartan. BP at home averages 120/60s. BP today is stable - no concerns. Continue adherence to antihypertensive therapy and begin low sodium diet.   History of asthma Assessment & Plan: H/o asthma d/t environmental triggers. Relevant medications: Breo Ellipta,  Albuterol, Flonase, and Claritin - all as needed. No acute concerns. Continue medications prn and start low-inflammatory meal plan.    Prediabetes Assessment & Plan: Diagnosed about 10 years ago. Most recent Hemoglobin A1c of 5.5. No current meds. Begin balanced diet focusing on protein, fruits, and vegetables while limiting simple carbohydrates. Check labs.    Other  hyperlipidemia Assessment & Plan: Around one month ago, pt started Rosuvastatin 5 mg 3 times per week. Most recent lipid panel on 01/19/23 showed LDL of 108. Maintain with statin and begin heart healthy MP. Recheck FLP in 3 months or so with either [place] or cardiology.    FOLLOW UP:   Follow up in 2 weeks. She was informed of the importance of frequent follow up visits to maximize her success with intensive lifestyle modifications for her multiple health conditions.  Jodi Bates is aware that we will review all of her lab results at our next visit.  She is aware that if anything is critical/ life threatening with the results, we will be contacting her via MyChart prior to the office visit to discuss management.    Chief Complaint:   OBESITY Jodi Bates (MR# [id number]) is a pleasant 58 y.o. female who presents for evaluation and treatment of obesity and related comorbidities. Current BMI is Body mass index is 39.31 kg/m. Jodi Bates has been struggling with her weight for many years and  has been unsuccessful in either losing weight, maintaining weight loss, or reaching her healthy weight goal.  Jodi Bates is currently in the action stage of change and ready to dedicate time achieving and maintaining a healthier weight. Jodi Bates is interested in becoming our patient and working on intensive lifestyle modifications including (but not limited to) diet and exercise for weight loss.  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  Patient is married to Jodi Bates  and has 2 adult children. She lives with Jodi Bates.  Highest weight - 247 lbs, prior to starting our program pt lost 30 lbs while on Wegovy however she stopped taking because insurance stopped covering med.   Desires to be 160 lbs within a year.   Walks 30-45 minutes, 3 times per wk. Averages 7500 steps daily per fitness tracker.   Has tried [employer] in the past.    Eats outside the home 2-3 times per week  Craves: chicken, shrimp, and fish.  Does not eat beef or pork  Snacks on popcorn, oranges, apples, cucumbers, yogurt bars, & sweet/salty bars.  Eats a "very light breakfast", however denies skipping meals.   Denies drinking regular soda, sometimes has protein shakes and tea.   Worst food habit: "swedish fish and not eating enough protein".  Subjective:   This is the patient's first visit at [facility].  The patient's NEW PATIENT PACKET that they filled out prior to today's office visit was reviewed at length and information from that paperwork was included within the following office visit note.    Included in the packet: current and past health history, medications, allergies, ROS, gynecologic history (women only), surgical history, family history, social history, weight history, weight loss surgery history (for those that have had weight loss surgery), nutritional evaluation, mood and food questionnaire along with a depression screening (PHQ9) on all patients, an Epworth questionnaire, sleep habits questionnaire, patient life and health improvement goals questionnaire. These will all be scanned into the patient's chart under the "media" tab.   Review of Systems: Please refer to new patient packet scanned into media. Pertinent positives were addressed with patient today.  Reviewed by clinician on day of visit: allergies, medications, problem list, medical history, surgical history, family history, social history, and previous encounter notes.  During the visit, I independently reviewed the patient's EKG, bioimpedance scale results, and indirect calorimeter results. I used this information to tailor a meal plan for the patient that will help Jodi Bates to lose weight and will improve her obesity-related conditions going forward.  I performed a medically necessary appropriate examination and/or evaluation. I discussed  the assessment and treatment plan with the patient. The patient was provided an opportunity to ask questions and all were answered. The patient agreed with the plan and demonstrated an understanding of the instructions. Labs were ordered today (unless patient declined them) and will be reviewed with the patient at our next visit unless more critical results need to be addressed immediately. Clinical information was updated and documented in the EMR.   Objective:   PHYSICAL EXAM: Blood pressure 122/78, pulse (!) 53, temperature 98 F (36.7 C), height 5\' 4"  (1.626 m), weight 229 lb (103.9 kg), last menstrual period 06/11/2016, SpO2 99%. Body mass index is 39.31 kg/m.  General: Well Developed, well nourished, and in no acute distress.  HEENT: Normocephalic, atraumatic; EOMI, sclerae are anicteric. Skin: Warm and dry, good turgor Chest:  Normal excursion, shape, no gross  ABN Respiratory: No conversational dyspnea; speaking in full sentences NeuroM-Sk:  Normal gross ROM * 4 extremities  Psych: A and O *3, insight adequate, mood- full   Anthropometric Measurements Height: 5\' 4"  (1.626 m) Weight: 229 lb (103.9 kg) BMI (Calculated): 39.29 Starting Weight: 229 lb Waist Measurement : 43 inches   Body Composition  Body Fat %: 46.8 % Fat Mass (lbs): 107.4 lbs Muscle Mass (lbs): 115.8 lbs Total Body Water (lbs): 81.6 lbs Visceral Fat Rating : 14   Other Clinical Data RMR: 1901 Fasting: yes Labs: yes Today's Visit #: 1 Starting Date: 03/16/23    DIAGNOSTIC DATA REVIEWED:  BMET    Component Value Date/Time   NA 140 06/04/2022 0739   K 4.2 06/04/2022 0739   CL 105 06/04/2022 0739   CO2 24 06/04/2022 0739   GLUCOSE 95 06/04/2022 0739   GLUCOSE 123 (H) 02/01/2021 1308   BUN 14 06/04/2022 0739   CREATININE 0.84 06/04/2022 0739   CREATININE 0.73 12/14/2019 1602   CALCIUM 9.2 06/04/2022 0739   GFRNONAA >60 02/01/2021 1308   GFRNONAA 93 12/14/2019 1602   GFRAA 108 12/14/2019 1602    Lab Results  Component Value Date   HGBA1C 5.5 06/23/2022   HGBA1C 5.9 (H) 12/29/2011   No results found for: "INSULIN" Lab Results  Component Value Date   TSH 2.11 03/25/2015   CBC    Component Value Date/Time   WBC 5.6 02/01/2021 1308   RBC 4.62 02/01/2021 1308   HGB 14.0 02/01/2021 1308   HGB 12.2 10/16/2008 0914   HCT 42.0 02/01/2021 1308   HCT 36.1 10/16/2008 0914   PLT 198 02/01/2021 1308   PLT 183 10/16/2008 0914   MCV 90.9 02/01/2021 1308   MCV 86.4 10/16/2008 0914   MCH 30.3 02/01/2021 1308   MCHC 33.3 02/01/2021 1308   RDW 12.6 02/01/2021 1308   RDW 13.9 10/16/2008 0914   Iron Studies    Component Value Date/Time   IRON 98 12/29/2011 1654   TIBC 369 12/29/2011 1654   FERRITIN 11 10/16/2008 0914   IRONPCTSAT 27 12/29/2011 1654   Lipid Panel     Component Value Date/Time   CHOL 178 01/19/2023 1121   TRIG 82 01/19/2023 1121   HDL 55 01/19/2023 1121   CHOLHDL 3.2 01/19/2023 1121   CHOLHDL 3.2 03/25/2015 0905   VLDL 16 03/25/2015 0905   LDLCALC 108 (H) 01/19/2023 1121   Hepatic Function Panel     Component Value Date/Time   PROT 6.2 01/19/2023 1121   ALBUMIN 4.2 01/19/2023 1121   AST 25 01/19/2023 1121   ALT 21 01/19/2023 1121   ALKPHOS 84 01/19/2023 1121   BILITOT 0.5 01/19/2023 1121   BILIDIR 0.17 01/19/2023 1121      Component Value Date/Time   TSH 2.11 03/25/2015 0905   Nutritional Lab Results  Component Value Date   VD25OH 42 03/25/2015   VD25OH 46 01/11/2014   VD25OH 48 01/02/2013    Attestation Statements:   I, Special Puri, acting as a Stage manager for Thomasene Lot, DO., have compiled all relevant documentation for today's office visit on behalf of Thomasene Lot, DO, while in the presence of [employer], DO.  Reviewed by clinician on day of visit: allergies, medications, problem list, medical history, surgical history, family history, social history, and previous encounter notes pertinent to patient's obesity  diagnosis. I have spent 45 minutes in the care of the patient today including: preparing to see patient (e.g. review and interpretation of  tests, old notes ), obtaining and/or reviewing separately obtained history, performing a medically appropriate examination or evaluation, counseling and educating the patient, ordering medications, test or procedures, documenting clinical information in the electronic or other health care record, and independently interpreting results and communicating results to the patient, family, or caregiver   I have reviewed the above documentation for accuracy and completeness, and I agree with the above. Carlye Grippe, D.O.  The 21st Century Cures Act was signed into law in 2016 which includes the topic of electronic health records.  This provides immediate access to information in MyChart.  This includes consultation notes, operative notes, office notes, lab results and pathology reports.  If you have any questions about what you read please let us know at your next visit so we can discuss your concerns and take corrective action if need be.  We are right here with you.

## 2023-03-17 DIAGNOSIS — R0602 Shortness of breath: Secondary | ICD-10-CM | POA: Diagnosis not present

## 2023-03-17 DIAGNOSIS — I10 Essential (primary) hypertension: Secondary | ICD-10-CM | POA: Diagnosis not present

## 2023-03-17 DIAGNOSIS — R5383 Other fatigue: Secondary | ICD-10-CM | POA: Diagnosis not present

## 2023-03-17 DIAGNOSIS — R7303 Prediabetes: Secondary | ICD-10-CM | POA: Diagnosis not present

## 2023-03-18 LAB — BASIC METABOLIC PANEL
BUN/Creatinine Ratio: 21 (ref 9–23)
BUN: 18 mg/dL (ref 6–24)
CO2: 22 mmol/L (ref 20–29)
Calcium: 9.1 mg/dL (ref 8.7–10.2)
Chloride: 105 mmol/L (ref 96–106)
Creatinine, Ser: 0.85 mg/dL (ref 0.57–1.00)
Glucose: 90 mg/dL (ref 70–99)
Potassium: 4.2 mmol/L (ref 3.5–5.2)
Sodium: 142 mmol/L (ref 134–144)
eGFR: 80 mL/min/{1.73_m2} (ref >59)

## 2023-03-18 LAB — FOLATE: Folate: 15.6 ng/mL (ref >3.0)

## 2023-03-18 LAB — CBC WITH DIFFERENTIAL/PLATELET
Basophils Absolute: 0 10*3/uL (ref 0.0–0.2)
Basos: 1 %
EOS (ABSOLUTE): 0.1 10*3/uL (ref 0.0–0.4)
Eos: 3 %
Hematocrit: 41.3 % (ref 34.0–46.6)
Hemoglobin: 13.5 g/dL (ref 11.1–15.9)
Immature Grans (Abs): 0 10*3/uL (ref 0.0–0.1)
Immature Granulocytes: 0 %
Lymphocytes Absolute: 1.3 10*3/uL (ref 0.7–3.1)
Lymphs: 41 %
MCH: 30.2 pg (ref 26.6–33.0)
MCHC: 32.7 g/dL (ref 31.5–35.7)
MCV: 92 fL (ref 79–97)
Monocytes Absolute: 0.4 10*3/uL (ref 0.1–0.9)
Monocytes: 12 %
Neutrophils Absolute: 1.4 10*3/uL (ref 1.4–7.0)
Neutrophils: 43 %
Platelets: 195 10*3/uL (ref 150–450)
RBC: 4.47 x10E6/uL (ref 3.77–5.28)
RDW: 12.2 % (ref 11.7–15.4)
WBC: 3.2 10*3/uL — ABNORMAL LOW (ref 3.4–10.8)

## 2023-03-18 LAB — T4, FREE: Free T4: 1.09 ng/dL (ref 0.82–1.77)

## 2023-03-18 LAB — INSULIN, RANDOM: INSULIN: 10.1 u[IU]/mL (ref 2.6–24.9)

## 2023-03-18 LAB — VITAMIN D 25 HYDROXY (VIT D DEFICIENCY, FRACTURES): Vit D, 25-Hydroxy: 25.5 ng/mL — ABNORMAL LOW (ref 30.0–100.0)

## 2023-03-18 LAB — HEMOGLOBIN A1C
Est. average glucose Bld gHb Est-mCnc: 123 mg/dL
Hgb A1c MFr Bld: 5.9 % — ABNORMAL HIGH (ref 4.8–5.6)

## 2023-03-18 LAB — TSH: TSH: 2.42 u[IU]/mL (ref 0.450–4.500)

## 2023-03-18 LAB — VITAMIN B12: Vitamin B-12: 609 pg/mL (ref 232–1245)

## 2023-03-18 LAB — T3: T3, Total: 106 ng/dL (ref 71–180)

## 2023-03-30 ENCOUNTER — Encounter (INDEPENDENT_AMBULATORY_CARE_PROVIDER_SITE_OTHER): Payer: Self-pay | Admitting: Family Medicine

## 2023-03-30 ENCOUNTER — Ambulatory Visit (INDEPENDENT_AMBULATORY_CARE_PROVIDER_SITE_OTHER): Payer: Commercial Managed Care - PPO | Admitting: Family Medicine

## 2023-03-30 ENCOUNTER — Other Ambulatory Visit (HOSPITAL_COMMUNITY): Payer: Self-pay

## 2023-03-30 VITALS — BP 124/80 | HR 63 | Temp 97.8°F | Ht 64.0 in | Wt 225.0 lb

## 2023-03-30 DIAGNOSIS — R7303 Prediabetes: Secondary | ICD-10-CM

## 2023-03-30 DIAGNOSIS — E7849 Other hyperlipidemia: Secondary | ICD-10-CM

## 2023-03-30 DIAGNOSIS — I10 Essential (primary) hypertension: Secondary | ICD-10-CM | POA: Diagnosis not present

## 2023-03-30 DIAGNOSIS — E785 Hyperlipidemia, unspecified: Secondary | ICD-10-CM | POA: Diagnosis not present

## 2023-03-30 DIAGNOSIS — Z6838 Body mass index (BMI) 38.0-38.9, adult: Secondary | ICD-10-CM

## 2023-03-30 DIAGNOSIS — E669 Obesity, unspecified: Secondary | ICD-10-CM | POA: Diagnosis not present

## 2023-03-30 DIAGNOSIS — E66812 Obesity, class 2: Secondary | ICD-10-CM

## 2023-03-30 DIAGNOSIS — Z6835 Body mass index (BMI) 35.0-35.9, adult: Secondary | ICD-10-CM

## 2023-03-30 NOTE — Progress Notes (Signed)
 Office: [phone]  /  Fax: [phone]  WEIGHT SUMMARY AND BIOMETRICS  Anthropometric Measurements Height: 5\' 4"  (1.626 m) Weight: 225 lb (102.1 kg) BMI (Calculated): 38.6 Weight at Last Visit: 229 lb Weight Lost Since Last Visit: 4 lb Weight Gained Since Last Visit: 0 lb Starting Weight: 229 lb Total Weight Loss (lbs): 4 lb (1.814 kg) Peak Weight: 248 lb   Body Composition  Body Fat %: 45.3 % Fat Mass (lbs): 102 lbs Muscle Mass (lbs): 117 lbs Total Body Water (lbs): 78.8 lbs Visceral Fat Rating : 14   Other Clinical Data RMR: [id number] Fasting: no Labs: no Today's Visit #: 2 Starting Date: 03/16/23    Chief Complaint: OBESITY   History of Present Illness   Jodi Bates is a 58 year old female with prediabetes, hypertension, and hyperlipidemia who presents for a follow-up visit to review lab results. She was previously seen by Dr. Molli Knock for her initial visit.  She is actively managing her prediabetes, hypertension, and hyperlipidemia through diet and exercise. She adheres to a category two eating plan with approximately 98% compliance, resulting in a weight loss of four pounds over the past two weeks. Her exercise routine includes walking for 30 minutes, three times a week.  Her dietary adjustments include avoiding yogurt due to concerns about migraines, opting instead for eggs with bread or toast, sometimes with cheese. She is mindful of sodium intake, particularly in sandwiches, and chooses lower sodium options and lean meats.  Recent lab results indicate an LDL cholesterol level of 108. She is on a low dose of rosuvastatin. Her vitamin D level is low, a recurring issue due to a genetic predisposition. 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 Her fasting glucose is normal at 90, and her A1c is 5.5, indicating prediabetes. An elevated insulin level of 10  suggests her pancreas is overworking to maintain normal blood sugar levels.  She has a family history of thyroid issues, as her mother was on thyroid medication.          PHYSICAL EXAM:  Blood pressure 124/80, pulse 63, temperature 97.8 F (36.6 C), height 5\' 4"  (1.626 m), weight 225 lb (102.1 kg), last menstrual period 06/11/2016, SpO2 98%. Body mass index is 38.62 kg/m.  DIAGNOSTIC DATA REVIEWED:  BMET    Component Value Date/Time   NA 142 03/17/2023 1200   K 4.2 03/17/2023 1200   CL 105 03/17/2023 1200   CO2 22 03/17/2023 1200   GLUCOSE 90 03/17/2023 1200   GLUCOSE 123 (H) 02/01/2021 1308   BUN 18 03/17/2023 1200   CREATININE 0.85 03/17/2023 1200   CREATININE 0.73 12/14/2019 1602   CALCIUM 9.1 03/17/2023 1200   GFRNONAA >60 02/01/2021 1308   GFRNONAA 93 12/14/2019 1602   GFRAA 108 12/14/2019 1602   Lab Results  Component Value Date   HGBA1C 5.9 (H) 03/17/2023   HGBA1C 5.9 (H) 12/29/2011   Lab Results  Component Value Date   INSULIN 10.1 03/17/2023   Lab Results  Component Value Date   TSH 2.420 03/17/2023   CBC    Component Value Date/Time   WBC 3.2 (L) 03/17/2023 1200   WBC 5.6 02/01/2021 1308   RBC 4.47 03/17/2023 1200   RBC 4.62 02/01/2021 1308   HGB 13.5 03/17/2023 1200   HGB 12.2 10/16/2008 0914   HCT 41.3 03/17/2023 1200   HCT 36.1 10/16/2008 0914  PLT 195 03/17/2023 1200   MCV 92 03/17/2023 1200   MCV 86.4 10/16/2008 0914   MCH 30.2 03/17/2023 1200   MCH 30.3 02/01/2021 1308   MCHC 32.7 03/17/2023 1200   MCHC 33.3 02/01/2021 1308   RDW 12.2 03/17/2023 1200   RDW 13.9 10/16/2008 0914   Iron Studies    Component Value Date/Time   IRON 98 12/29/2011 1654   TIBC 369 12/29/2011 1654   FERRITIN 11 10/16/2008 0914   IRONPCTSAT 27 12/29/2011 1654   Lipid Panel     Component Value Date/Time   CHOL 178 01/19/2023 1121   TRIG 82 01/19/2023 1121   HDL 55 01/19/2023 1121   CHOLHDL 3.2 01/19/2023 1121   CHOLHDL 3.2 03/25/2015 0905   VLDL  16 03/25/2015 0905   LDLCALC 108 (H) 01/19/2023 1121   Hepatic Function Panel     Component Value Date/Time   PROT 6.2 01/19/2023 1121   ALBUMIN 4.2 01/19/2023 1121   AST 25 01/19/2023 1121   ALT 21 01/19/2023 1121   ALKPHOS 84 01/19/2023 1121   BILITOT 0.5 01/19/2023 1121   BILIDIR 0.17 01/19/2023 1121      Component Value Date/Time   TSH 2.420 03/17/2023 1200   Nutritional Lab Results  Component Value Date   VD25OH 25.5 (L) 03/17/2023   VD25OH 42 03/25/2015   VD25OH 46 01/11/2014     Assessment and Plan    Prediabetes Prediabetes with fasting glucose of 90 and A1c of 5.5. Insulin level at 10 indicates increased pancreatic activity. Discussed diet focusing on protein intake and minimizing carbohydrates. Explained protein's role in blood sugar management and weight loss. Discussed metformin for weight loss and diabetes prevention. Patient prefers diet and exercise alone for now. - Continue category two eating plan - Provide handout on prediabetes - Consider metformin if diet and exercise become insufficient  Hypertension Blood pressure today was 124/80, indicating good control on current medication. Discussed potential medication reduction with continued weight loss and stable blood pressure. - Continue current antihypertensive medication - Monitor blood pressure regularly - Adjust medication if blood pressure decreases significantly  Hyperlipidemia LDL cholesterol slightly elevated at 108. Currently on low-dose rosuvastatin. Other cholesterol levels are within normal limits. - Continue rosuvastatin - Monitor cholesterol levels regularly  Vitamin D Deficiency Chronic low vitamin D levels. Discussed genetic predisposition and importance of maintaining levels to prevent osteoporosis and reduce fatigue. Explained efficacy and minimal side effects of weekly prescriptive vitamin D. - Prescribe weekly vitamin D supplementation for 3 months - Recheck vitamin D levels after 3  months  General Health Maintenance Working on weight loss through diet and exercise. Walking 30 minutes three times per week. Discussed additional breakfast options to support dietary plan, emphasizing protein's role in blood sugar management and weight loss. - Continue walking 30 minutes three times per week - Provide additional breakfast options - Encourage weight loss to improve overall health  Follow-up - Schedule follow-up appointment.         I have personally spent 40 minutes total time today in preparation, patient care, and documentation for this visit, including the following: review of clinical lab tests; review of medical tests/procedures/services.    She was informed of the importance of frequent follow up visits to maximize her success with intensive lifestyle modifications for her multiple health conditions.    Quillian Quince, MD

## 2023-04-06 ENCOUNTER — Other Ambulatory Visit (HOSPITAL_COMMUNITY): Payer: Self-pay

## 2023-04-06 DIAGNOSIS — H04123 Dry eye syndrome of bilateral lacrimal glands: Secondary | ICD-10-CM | POA: Diagnosis not present

## 2023-04-06 DIAGNOSIS — E782 Mixed hyperlipidemia: Secondary | ICD-10-CM | POA: Diagnosis not present

## 2023-04-06 DIAGNOSIS — H40013 Open angle with borderline findings, low risk, bilateral: Secondary | ICD-10-CM | POA: Diagnosis not present

## 2023-04-07 ENCOUNTER — Encounter (HOSPITAL_BASED_OUTPATIENT_CLINIC_OR_DEPARTMENT_OTHER): Payer: Self-pay

## 2023-04-07 LAB — HEPATIC FUNCTION PANEL
ALT: 9 IU/L (ref 0–32)
AST: 18 IU/L (ref 0–40)
Albumin: 4.2 g/dL (ref 3.8–4.9)
Alkaline Phosphatase: 78 IU/L (ref 44–121)
Bilirubin Total: 0.5 mg/dL (ref 0.0–1.2)
Bilirubin, Direct: 0.17 mg/dL (ref 0.00–0.40)
Total Protein: 6.4 g/dL (ref 6.0–8.5)

## 2023-04-07 LAB — LIPID PANEL
Chol/HDL Ratio: 2.6 ratio (ref 0.0–4.4)
Cholesterol, Total: 126 mg/dL (ref 100–199)
HDL: 48 mg/dL (ref >39)
LDL Chol Calc (NIH): 65 mg/dL (ref 0–99)
Triglycerides: 59 mg/dL (ref 0–149)
VLDL Cholesterol Cal: 13 mg/dL (ref 5–40)

## 2023-04-08 ENCOUNTER — Encounter (HOSPITAL_BASED_OUTPATIENT_CLINIC_OR_DEPARTMENT_OTHER): Payer: Self-pay

## 2023-04-13 ENCOUNTER — Ambulatory Visit (INDEPENDENT_AMBULATORY_CARE_PROVIDER_SITE_OTHER): Payer: Commercial Managed Care - PPO | Admitting: Family Medicine

## 2023-04-13 ENCOUNTER — Encounter (INDEPENDENT_AMBULATORY_CARE_PROVIDER_SITE_OTHER): Payer: Self-pay | Admitting: Family Medicine

## 2023-04-13 ENCOUNTER — Other Ambulatory Visit (HOSPITAL_COMMUNITY): Payer: Self-pay

## 2023-04-13 VITALS — BP 124/81 | HR 59 | Temp 97.5°F | Ht 64.0 in | Wt 224.0 lb

## 2023-04-13 DIAGNOSIS — J309 Allergic rhinitis, unspecified: Secondary | ICD-10-CM | POA: Diagnosis not present

## 2023-04-13 DIAGNOSIS — E785 Hyperlipidemia, unspecified: Secondary | ICD-10-CM

## 2023-04-13 DIAGNOSIS — E559 Vitamin D deficiency, unspecified: Secondary | ICD-10-CM

## 2023-04-13 DIAGNOSIS — Z6838 Body mass index (BMI) 38.0-38.9, adult: Secondary | ICD-10-CM | POA: Diagnosis not present

## 2023-04-13 DIAGNOSIS — E7849 Other hyperlipidemia: Secondary | ICD-10-CM

## 2023-04-13 DIAGNOSIS — E669 Obesity, unspecified: Secondary | ICD-10-CM | POA: Diagnosis not present

## 2023-04-13 MED ORDER — VITAMIN D (ERGOCALCIFEROL) 1.25 MG (50000 UNIT) PO CAPS
50000.0000 [IU] | ORAL_CAPSULE | ORAL | 0 refills | Status: DC
  Filled 2023-04-13: qty 4, 28d supply, fill #0

## 2023-04-13 NOTE — Progress Notes (Signed)
 Office: [phone]  /  Fax: [phone]  WEIGHT SUMMARY AND BIOMETRICS  Anthropometric Measurements Height: 5\' 4"  (1.626 m) Weight: 224 lb (101.6 kg) BMI (Calculated): 38.43 Weight at Last Visit: 225 lb Weight Lost Since Last Visit: 1 lb Weight Gained Since Last Visit: 0 Starting Weight: 229 lb Total Weight Loss (lbs): 5 lb (2.268 kg) Peak Weight: 248 lb   Body Composition  Body Fat %: 46.2 % Fat Mass (lbs): 103.8 lbs Muscle Mass (lbs): 114.8 lbs Total Body Water (lbs): 81.6 lbs Visceral Fat Rating : 14   Other Clinical Data Fasting: No Labs: No Today's Visit #: 3 Starting Date: 03/16/23    Chief Complaint: OBESITY   History of Present Illness   Jodi Bates is a 58 year old female who presents for obesity treatment follow-up.  She is attending her third visit to discuss her obesity treatment plan. She adheres to her category two eating plan 95% of the time and engages in physical activity by walking for 30 minutes twice a week. She has lost another pound over the last two weeks. Despite challenges such as attending a birthday and a work celebration, she maintains her dietary discipline, opting for healthier choices like chicken, fish, and vegetables, and avoiding high-calorie foods like donuts, cake, and pizza. She had a single instance of eating a small slice of pizza when she missed a meal.  She experiences challenges with her exercise routine due to not feeling well, which she attributes to the time change affecting her sleep schedule. She goes to bed late and wakes up early, leading to exhaustion. She engages in some untimed exercise activities and has started taking vitamin C to manage her allergies, as she ran out of her allergy medications. She feels better after starting vitamin C and is working on re-establishing her routine.  She experiences leg and foot pain, which she associates with her recent use of rosuvastatin. She was advised to hold the  medication for two weeks to see if the pain subsides. The rosuvastatin effectively lowered her LDL by 40%, but the pain was significant. She has been off the medication for less than a week and reports feeling a difference in her symptoms since stopping it.  She mentions that she never received a prescription for vitamin D, which she had inquired about during her last visit. She has been taking over-the-counter vitamin D in the meantime.          PHYSICAL EXAM:  Blood pressure 124/81, pulse (!) 59, temperature (!) 97.5 F (36.4 C), height 5\' 4"  (1.626 m), weight 224 lb (101.6 kg), last menstrual period 06/11/2016, SpO2 96%. Body mass index is 38.45 kg/m.  DIAGNOSTIC DATA REVIEWED:  BMET    Component Value Date/Time   NA 142 03/17/2023 1200   K 4.2 03/17/2023 1200   CL 105 03/17/2023 1200   CO2 22 03/17/2023 1200   GLUCOSE 90 03/17/2023 1200   GLUCOSE 123 (H) 02/01/2021 1308   BUN 18 03/17/2023 1200   CREATININE 0.85 03/17/2023 1200   CREATININE 0.73 12/14/2019 1602   CALCIUM 9.1 03/17/2023 1200   GFRNONAA >60 02/01/2021 1308   GFRNONAA 93 12/14/2019 1602   GFRAA 108 12/14/2019 1602   Lab Results  Component Value Date   HGBA1C 5.9 (H) 03/17/2023   HGBA1C 5.9 (H) 12/29/2011   Lab Results  Component Value Date   INSULIN 10.1 03/17/2023   Lab Results  Component Value Date   TSH 2.420 03/17/2023   CBC  Component Value Date/Time   WBC 3.2 (L) 03/17/2023 1200   WBC 5.6 02/01/2021 1308   RBC 4.47 03/17/2023 1200   RBC 4.62 02/01/2021 1308   HGB 13.5 03/17/2023 1200   HGB 12.2 10/16/2008 0914   HCT 41.3 03/17/2023 1200   HCT 36.1 10/16/2008 0914   PLT 195 03/17/2023 1200   MCV 92 03/17/2023 1200   MCV 86.4 10/16/2008 0914   MCH 30.2 03/17/2023 1200   MCH 30.3 02/01/2021 1308   MCHC 32.7 03/17/2023 1200   MCHC 33.3 02/01/2021 1308   RDW 12.2 03/17/2023 1200   RDW 13.9 10/16/2008 0914   Iron Studies    Component Value Date/Time   IRON 98 12/29/2011 1654    TIBC 369 12/29/2011 1654   FERRITIN 11 10/16/2008 0914   IRONPCTSAT 27 12/29/2011 1654   Lipid Panel     Component Value Date/Time   CHOL 126 04/06/2023 1059   TRIG 59 04/06/2023 1059   HDL 48 04/06/2023 1059   CHOLHDL 2.6 04/06/2023 1059   CHOLHDL 3.2 03/25/2015 0905   VLDL 16 03/25/2015 0905   LDLCALC 65 04/06/2023 1059   Hepatic Function Panel     Component Value Date/Time   PROT 6.4 04/06/2023 1059   ALBUMIN 4.2 04/06/2023 1059   AST 18 04/06/2023 1059   ALT 9 04/06/2023 1059   ALKPHOS 78 04/06/2023 1059   BILITOT 0.5 04/06/2023 1059   BILIDIR 0.17 04/06/2023 1059      Component Value Date/Time   TSH 2.420 03/17/2023 1200   Nutritional Lab Results  Component Value Date   VD25OH 25.5 (L) 03/17/2023   VD25OH 42 03/25/2015   VD25OH 46 01/11/2014     Assessment and Plan    Obesity On her third visit for obesity management, she adheres to her category two eating plan 95% of the time and engages in physical activity by walking for 30 minutes twice a week. She has lost another pound in the last two weeks. Demonstrates significant willpower in adhering to her dietary plan despite social pressures and unintentional sabotage from peers. Experienced a slight dip in muscle mass, common in the first two to three months of weight loss. Recommends increasing muscle challenge to prevent further muscle loss. - Continue category two eating plan. - Increase physical activity by incorporating ankle weights or walking on hills/stairs to challenge muscles. - Focus on hydration to support overall health.  Hyperlipidemia Previously on rosuvastatin, which effectively lowered LDL by 40% (from 108 to 65). Experienced leg and foot pain, leading to a temporary discontinuation of the medication. Pain has subsided since stopping the medication. Emphasized the importance of hydration to prevent muscle breakdown and potential kidney issues associated with statin intolerance. - Hold rosuvastatin  for two weeks and monitor symptoms. - Ensure adequate hydration to prevent muscle breakdown. - Follow up with the prescribing doctor for further management of hyperlipidemia.  Vitamin D Deficiency Did not receive the prescription for vitamin D after the last visit. Has been taking over-the-counter vitamin D in the interim. - Send prescription for vitamin D to [facility] in [place].  Allergic rhinitis Managing allergies with vitamin C and advised to continue using Flonase and Claritin as needed. - Continue using Flonase and Claritin for allergy management.       She was informed of the importance of frequent follow up visits to maximize her success with intensive lifestyle modifications for her multiple health conditions.    Quillian Quince, MD

## 2023-04-20 NOTE — Progress Notes (Unsigned)
 PATIENT: Jodi Bates DOB: October 13, 1965  REASON FOR VISIT: follow up for migraine HISTORY FROM: patient Primary Neurologist: Dr. Athena Masse   Virtual Visit via Video Note  I connected with Jodi Bates on 04/21/23 at  3:15 PM EDT by a video enabled telemedicine application and verified that I am speaking with the correct person using two identifiers.  Location: Patient: at her work Provider: in the office    I discussed the limitations of evaluation and management by telemedicine and the availability of in person appointments. The patient expressed understanding and agreed to proceed.  HISTORY OF PRESENT ILLNESS: Today 04/21/23 Via VV, is taking Topamax 50 mg at bedtime, was able to reduce from 75 mg daily. Plans to try and reduce to 25 mg at bedtime on a weekend. Migraines are a lot better, cannot recall a recent migraine with vertigo. If headache is coming on will take Tylenol with excellent benefit. Has been started on Crestor, some myalgias.  Has not needed Imitrex.  04/21/22 SS: 4 migraines a month on average, but varies, depends on diet. Still on Topamax 75 mg at bedtime, tolerates well, no side effects. Has not needed Imitrex. Takes Tylenol for migraine with good benefit. Sometimes migraines cause vertigo. Remains on on Wegovy, has lost 30 lbs.   04/15/21 SS: Jodi Bates here today for follow up. Remains on Topamax 75 mg at bedtime. Hasn't taken the Imitrex. Headaches are not severe, doesn't need any rescue medication. Headache syndrome starts as dizziness, then bifrontal headache, none of this in months, since COVID back in October, during COVID Dr. Anne Hahn sent in 2 rounds of Decadron with excellent benefit. Saw ENT for vertigo. Back in November, felt good candidate for vestibular rehab, but never heard from referral. Now, no issues with vertigo, but is careful with position changes with lying down. Monitors blood pressure closely. On weight loss injection, Wegovy  since Feb, is exercising. [facility]   08/13/2020 SS: Jodi Bates is a 58 year old year old female with history of migraine headache with vertigo.  Is on Topamax, Maxalt as needed. Has the dizziness sensation then develops bifrontal headache. Doing well with Topamax 75 mg at bedtime. Less spells, noticed it is correlated with diet. June 23rd last spell. No side effects from the Topamax. Stopped the Maxalt, only took 1/2 tablet, made her head feel like vice grip. It did make the headache go away. Claims is 90% better. No changes to medical history. 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  Overall, much improved.  Here today alone.  HISTORY  04/09/2020 Dr. Anne Hahn: Jodi Bates is a 58 year old right-handed black female with a history of migraine headaches since she was a teenager.  The patient has a prominent family history for migraine, her son and daughter and her mother also have headaches.  The patient has been seen and evaluated through this office in 2013 for episodes of vertigo and headache that have occurred together since 2008.  The patient had an event about 10 days ago with more severe symptoms.  She was driving to work and started having some problems with vertigo with a spinning sensation and associated nausea without vomiting.  The patient was off balance when she try to get to work and needed help to get to work and had to leave early.  She developed a bifrontal headache which is typical for these events.  The patient at times may have a feeling of muffled hearing in the right greater than left ear, she  may at times have some tinnitus that may occur with these events.  The patient denies any double vision or loss of vision, she denies any slurred speech or problems swallowing.  She has never had any syncope.  She denies any cognitive clouding with the events.  The patient denies any problems with neck stiffness.  She may take alprazolam and Pepto-Bismol for the events.  She has never  been on any medications for migraine.  She does not drink any caffeinated products during the day.  She is sent to this office for further evaluation of the above events.  She was seen by Dr. Marjory Lies through this office in 2013 for episodes of vertigo, MRI of the brain at that time was unremarkable.  REVIEW OF SYSTEMS: Out of a complete 14 system review of symptoms, the patient complains only of the following symptoms, and all other reviewed systems are negative.  See HPI  ALLERGIES: Allergies  Allergen Reactions   Adhesive [Tape] Itching and Other (See Comments)    Reaction:  Blisters    Latex Itching, Rash and Other (See Comments)   Soap Other (See Comments)    Soaps that contain any kind of fragrance triggers an asthma attack.     Hydrochlorothiazide     Hypokalemia   Other Cough    Fragrances     HOME MEDICATIONS: Outpatient Medications Prior to Visit  Medication Sig Dispense Refill   albuterol (PROVENTIL) (2.5 MG/3ML) 0.083% nebulizer solution Take 3 mLs by nebulization every 6 (six) hours as needed for wheezing or shortness of breath. 75 mL 1   albuterol (VENTOLIN HFA) 108 (90 Base) MCG/ACT inhaler Inhale 2 puffs into the lungs every 4 (four) hours as needed for wheezing or shortness of breath. 18 g 1   ALPRAZolam (XANAX) 0.5 MG tablet Take 1 tablet (0.5 mg total) by mouth 2 (two) times daily as needed. 60 tablet 2   amLODipine (NORVASC) 10 MG tablet Take 1 tablet (10 mg total) by mouth daily. 90 tablet 3   BREO ELLIPTA 100-25 MCG/ACT AEPB Inhale 1 puff into the lungs 1 to 7 times per week as needed 60 each 5   escitalopram (LEXAPRO) 20 MG tablet Take 1 tablet (20 mg total) by mouth daily. 90 tablet 3   fluticasone (FLONASE) 50 MCG/ACT nasal spray Place 1-2 sprays into both nostrils 1 to 7 times per week as needed 16 g 5   latanoprost (XALATAN) 0.005 % ophthalmic solution Place 1 drop into both eyes at bedtime. 2.5 mL 11   loratadine (CLARITIN) 10 MG tablet TAKE 1 TABLET (10  MG TOTAL) BY MOUTH DAILY. (Patient taking differently: Take 10 mg by mouth daily as needed for allergies.) 30 tablet 5   losartan (COZAAR) 50 MG tablet Take 1 tablet (50 mg total) by mouth daily. 90 tablet 3   rosuvastatin (CRESTOR) 5 MG tablet Take 1 tablet (5 mg total) by mouth 3 (three) times a week. 36 tablet 1   spironolactone (ALDACTONE) 25 MG tablet Take 1 tablet (25 mg total) by mouth daily. 90 tablet 3   SUMAtriptan (IMITREX) 25 MG tablet Take 1 tablet (25 mg total) by mouth every 2 (two) hours as needed for migraine. May repeat in 2 hours if headache persists or recurs. 10 tablet 5   Vitamin D, Ergocalciferol, (DRISDOL) 1.25 MG (50000 UNIT) CAPS capsule Take 1 capsule (50,000 Units total) by mouth every 7 (seven) days. 4 capsule 0   topiramate (TOPAMAX) 25 MG tablet Take 3 tablets (  75 mg total) by mouth at bedtime. 270 tablet 3   No facility-administered medications prior to visit.    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 > 5 yr.   Iron deficiency anemia    no current problems or meds.   Migraine headache with aura 04/09/2020   Morbid obesity (HCC) 11/10/2020   Prediabetes 02/14/2021    PAST SURGICAL HISTORY: Past Surgical History:  Procedure Laterality Date   BREAST CYST EXCISION     BREAST EXCISIONAL BIOPSY Left    BREAST SURGERY      lt breast/ benign cyst   CYST EXCISION     back of head   HYSTEROSCOPY WITH D & C  06/25/2004   INCISION AND DRAINAGE PERIRECTAL ABSCESS N/A 02/10/2013   Procedure: IRRIGATION AND DEBRIDEMENT PERIRECTAL ABSCESS;  Surgeon: Cherylynn Ridges, MD;  Location:  [facility] OR;  Service: General;  Laterality: N/A;   LAPAROSCOPIC ENDOMETRIOSIS FULGURATION     PILONIDAL CYST EXCISION N/A 04/28/2012   Procedure: exam under anesthesia and exicision of pilonidal;  Surgeon: Cherylynn Ridges, MD;  Location: [facility];  Service: General;  Laterality: N/A;   POLYPECTOMY     uterine   TUBAL LIGATION      FAMILY HISTORY: Family History  Problem Relation Age of Onset   Thyroid disease Mother    Stroke Mother    Diabetes Mother    Hypertension Mother    Allergic rhinitis Mother    Heart failure Mother    Kidney disease Mother    Stroke Father    Hypertension Father    Cancer Father        deceased metastaic throat cancer   Esophageal cancer Father    Alcoholism Father    Diabetes Brother    Pancreatic cancer Brother    Asthma Brother    Sleep apnea Brother    Cancer Maternal Grandmother        breast   Breast cancer Maternal Grandmother    Allergic rhinitis Son    Asthma Son    Colon cancer Neg Hx    Rectal cancer Neg Hx    Stomach cancer Neg Hx    Colon polyps Neg Hx     SOCIAL HISTORY: Social History   Socioeconomic History   Marital status: Married    Spouse name: Not on file   Number of children: Not on file   Years of education: Not on file   Highest education level: Not on file  Occupational History   Occupation: full time  Tobacco Use   Smoking status: Never   Smokeless tobacco: Never  Vaping Use   Vaping status: Never Used  Substance and Sexual Activity   Alcohol use: No   Drug use: No   Sexual activity: Yes    Partners: Male    Birth control/protection: None  Other Topics Concern   Not on file  Social History Narrative   Lives with husband   Right Handed   Drinks no caffeine   Social Drivers  of Health   Financial Resource Strain: Not on file  Food Insecurity: Not on file  Transportation Needs: Not on file  Physical Activity: Not on file  Stress: Not on file  Social Connections: Not on file  Intimate  Partner Violence: Not on file   PHYSICAL EXAM  There were no vitals filed for this visit.  There is no height or weight on file to calculate BMI.  Generalized: Well developed, in no acute distress  Via Video visit, is alert and oriented, speech is clear and concise, follows exam commands, moves about freely.  DIAGNOSTIC DATA (LABS, IMAGING, TESTING) - I reviewed patient records, labs, notes, testing and imaging myself where available.  Lab Results  Component Value Date   WBC 3.2 (L) 03/17/2023   HGB 13.5 03/17/2023   HCT 41.3 03/17/2023   MCV 92 03/17/2023   PLT 195 03/17/2023      Component Value Date/Time   NA 142 03/17/2023 1200   K 4.2 03/17/2023 1200   CL 105 03/17/2023 1200   CO2 22 03/17/2023 1200   GLUCOSE 90 03/17/2023 1200   GLUCOSE 123 (H) 02/01/2021 1308   BUN 18 03/17/2023 1200   CREATININE 0.85 03/17/2023 1200   CREATININE 0.73 12/14/2019 1602   CALCIUM 9.1 03/17/2023 1200   PROT 6.4 04/06/2023 1059   ALBUMIN 4.2 04/06/2023 1059   AST 18 04/06/2023 1059   ALT 9 04/06/2023 1059   ALKPHOS 78 04/06/2023 1059   BILITOT 0.5 04/06/2023 1059   GFRNONAA >60 02/01/2021 1308   GFRNONAA 93 12/14/2019 1602   GFRAA 108 12/14/2019 1602   Lab Results  Component Value Date   CHOL 126 04/06/2023   HDL 48 04/06/2023   LDLCALC 65 04/06/2023   TRIG 59 04/06/2023   CHOLHDL 2.6 04/06/2023   Lab Results  Component Value Date   HGBA1C 5.9 (H) 03/17/2023   Lab Results  Component Value Date   VITAMINB12 609 03/17/2023   Lab Results  Component Value Date   TSH 2.420 03/17/2023   ASSESSMENT AND PLAN 58 y.o. year old female  has a past medical history of Allergy, Anxiety, Asthma, Essential hypertension (03/22/2009), GERD (gastroesophageal reflux disease), Gluteal cleft wound (03/2012), Headache(784.0), Hyperlipidemia, Hypertension, Iron deficiency anemia, Migraine headache with aura (04/09/2020), Morbid obesity (HCC) (11/10/2020), and Prediabetes (02/14/2021). here  with:  1.  Migraine headache, vertigo as aura  -Headaches remain under excellent control  -Can continue Topamax 50 mg at bedtime, she may try to wean down to 25 mg, if she continues to do well with her migraine control, may stop the medication -Continue Tylenol as needed for headache, has Imitrex if needed -Follow up in 1 year VV   There is increased risk for stroke in women with migraine with aura and a contraindication for the combined contraceptive pill for use by women who have migraine with aura. The risk for women with migraine without aura is lower. However other risk factors like smoking are far more likely to increase stroke risk than migraine. There is a recommendation for no smoking and for the use of OCPs without estrogen such as progestogen only pills particularly for women with migraine with aura.Marland Kitchen People who have migraine headaches with auras may be 3 times more likely to have a stroke caused by a blood clot, compared to migraine patients who don't see auras. Women who take hormone-replacement therapy may be 30 percent more likely to suffer a clot-based stroke than women not taking medication containing estrogen. Other risk factors  like smoking and high blood pressure may be  much more important. And stroke is still a rare complication due to migraine aura and is controversial and lower doses may not cause a risk.   Margie Ege, AGNP-C, DNP 04/21/2023, 3:31 PM [facility] [address], [address] [place], [place] [address] [phone]

## 2023-04-21 ENCOUNTER — Other Ambulatory Visit (HOSPITAL_COMMUNITY): Payer: Self-pay

## 2023-04-21 ENCOUNTER — Telehealth: Payer: 59 | Admitting: Neurology

## 2023-04-21 DIAGNOSIS — G43109 Migraine with aura, not intractable, without status migrainosus: Secondary | ICD-10-CM

## 2023-04-21 MED ORDER — TOPIRAMATE 25 MG PO TABS
50.0000 mg | ORAL_TABLET | Freq: Every day | ORAL | 3 refills | Status: AC
  Filled 2023-04-21: qty 180, 90d supply, fill #0
  Filled 2023-10-02: qty 180, 90d supply, fill #1
  Filled 2024-02-13 – 2024-02-14 (×2): qty 180, 90d supply, fill #0

## 2023-04-21 NOTE — Patient Instructions (Signed)
 Great to see you today.  You may continue to wean the Topamax down to 25 mg.  If you continue to do well with your headache control you may stop the Topamax.  Continue Tylenol as needed.  Use Imitrex for severe headache.  If your migraines with vertigo increase please let me know.  Follow-up in 1 year.  Thanks!!  There is increased risk for stroke in women with migraine with aura and a contraindication for the combined contraceptive pill for use by women who have migraine with aura. The risk for women with migraine without aura is lower. However other risk factors like smoking are far more likely to increase stroke risk than migraine. There is a recommendation for no smoking and for the use of OCPs without estrogen such as progestogen only pills particularly for women with migraine with aura.Jodi Bates People who have migraine headaches with auras may be 3 times more likely to have a stroke caused by a blood clot, compared to migraine patients who don't see auras. Women who take hormone-replacement therapy may be 30 percent more likely to suffer a clot-based stroke than women not taking medication containing estrogen. Other risk factors like smoking and high blood pressure may be  much more important. And stroke is still a rare complication due to migraine aura and is controversial and lower doses may not cause a risk.

## 2023-04-26 ENCOUNTER — Encounter (HOSPITAL_BASED_OUTPATIENT_CLINIC_OR_DEPARTMENT_OTHER): Payer: Self-pay

## 2023-04-27 ENCOUNTER — Other Ambulatory Visit: Payer: Self-pay

## 2023-04-27 ENCOUNTER — Other Ambulatory Visit (HOSPITAL_BASED_OUTPATIENT_CLINIC_OR_DEPARTMENT_OTHER): Payer: Self-pay

## 2023-04-27 ENCOUNTER — Other Ambulatory Visit (HOSPITAL_COMMUNITY): Payer: Self-pay

## 2023-04-27 MED ORDER — PRAVASTATIN SODIUM 10 MG PO TABS
10.0000 mg | ORAL_TABLET | Freq: Every day | ORAL | 11 refills | Status: DC
Start: 2023-04-27 — End: 2023-06-01
  Filled 2023-04-27: qty 5, 5d supply, fill #0
  Filled 2023-04-27: qty 25, 25d supply, fill #0
  Filled 2023-04-27: qty 30, 30d supply, fill #0

## 2023-04-27 NOTE — Telephone Encounter (Signed)
 Please review and advise.

## 2023-04-27 NOTE — Telephone Encounter (Signed)
 Spoke to patient explain various statin LDL lowering ability and tolerability. Willing to try Pravastatin 10 mg daily and self adjust the dose to max tolerated dose. Patient will let us know what max dose of pravastatin she could tolerate in a month. [facility]

## 2023-05-04 ENCOUNTER — Encounter (INDEPENDENT_AMBULATORY_CARE_PROVIDER_SITE_OTHER): Payer: Self-pay | Admitting: Family Medicine

## 2023-05-04 ENCOUNTER — Ambulatory Visit (INDEPENDENT_AMBULATORY_CARE_PROVIDER_SITE_OTHER): Admitting: Family Medicine

## 2023-05-04 ENCOUNTER — Other Ambulatory Visit (HOSPITAL_COMMUNITY): Payer: Self-pay

## 2023-05-04 VITALS — BP 126/80 | HR 83 | Temp 98.1°F | Ht 64.0 in | Wt 220.0 lb

## 2023-05-04 DIAGNOSIS — E669 Obesity, unspecified: Secondary | ICD-10-CM | POA: Diagnosis not present

## 2023-05-04 DIAGNOSIS — F4321 Adjustment disorder with depressed mood: Secondary | ICD-10-CM | POA: Diagnosis not present

## 2023-05-04 DIAGNOSIS — Z6837 Body mass index (BMI) 37.0-37.9, adult: Secondary | ICD-10-CM

## 2023-05-04 DIAGNOSIS — E559 Vitamin D deficiency, unspecified: Secondary | ICD-10-CM | POA: Diagnosis not present

## 2023-05-04 MED ORDER — VITAMIN D (ERGOCALCIFEROL) 1.25 MG (50000 UNIT) PO CAPS
50000.0000 [IU] | ORAL_CAPSULE | ORAL | 0 refills | Status: DC
  Filled 2023-05-04 – 2023-05-05 (×2): qty 4, 28d supply, fill #0

## 2023-05-04 NOTE — Progress Notes (Signed)
 Office: [phone]  /  Fax: [phone]  WEIGHT SUMMARY AND BIOMETRICS  Anthropometric Measurements Height: 5\' 4"  (1.626 m) Weight: 220 lb (99.8 kg) BMI (Calculated): 37.74 Weight at Last Visit: 224 lb Weight Lost Since Last Visit: 4 lb Weight Gained Since Last Visit: 0 Starting Weight: 229 lb Total Weight Loss (lbs): 9 lb (4.082 kg) Peak Weight: 248 lb   Body Composition  Body Fat %: 45.2 % Fat Mass (lbs): 99.8 lbs Muscle Mass (lbs): 114.6 lbs Total Body Water (lbs): 79.2 lbs Visceral Fat Rating : 13   Other Clinical Data RMR: [id number] Fasting: no Labs: no Today's Visit #: 4 Starting Date: 03/16/23    Chief Complaint: OBESITY   History of Present Illness The patient presents to discuss her obesity treatment plan and assess her progress.  She is adhering to her category two eating plan with 95% compliance and has lost four pounds in the last two weeks. She engages in walking and weightlifting for thirty minutes, four times per week, and feels well overall. Her hunger is well controlled and appropriate, with hunger occurring soon after waking, which she views positively.  She recently visited the beach to manage stress related to the loss of her brother seven years ago, describing it as her 'happy place'. She has had counseling in the past, which helped her understand the importance of grieving without becoming overwhelmed. Her grief affects her eating habits, as she tends not to eat when feeling weighed down, but she ensures she eats what is necessary despite not finding comfort in food.  She performs both upper and lower body strengthening exercises, with lower body workouts at the gym and upper body exercises at home. She focuses more on lower body exercises after experiencing discomfort from rosuvastatin. She also engages her core during exercises as part of her physical therapy for back and neck issues.  She is currently taking vitamin D supplements and requires a  refill.      PHYSICAL EXAM:  Blood pressure 126/80, pulse 83, temperature 98.1 F (36.7 C), height 5\' 4"  (1.626 m), weight 220 lb (99.8 kg), last menstrual period 06/11/2016, SpO2 99%. Body mass index is 37.76 kg/m.  DIAGNOSTIC DATA REVIEWED:  BMET    Component Value Date/Time   NA 142 03/17/2023 1200   K 4.2 03/17/2023 1200   CL 105 03/17/2023 1200   CO2 22 03/17/2023 1200   GLUCOSE 90 03/17/2023 1200   GLUCOSE 123 (H) 02/01/2021 1308   BUN 18 03/17/2023 1200   CREATININE 0.85 03/17/2023 1200   CREATININE 0.73 12/14/2019 1602   CALCIUM 9.1 03/17/2023 1200   GFRNONAA >60 02/01/2021 1308   GFRNONAA 93 12/14/2019 1602   GFRAA 108 12/14/2019 1602   Lab Results  Component Value Date   HGBA1C 5.9 (H) 03/17/2023   HGBA1C 5.9 (H) 12/29/2011   Lab Results  Component Value Date   INSULIN 10.1 03/17/2023   Lab Results  Component Value Date   TSH 2.420 03/17/2023   CBC    Component Value Date/Time   WBC 3.2 (L) 03/17/2023 1200   WBC 5.6 02/01/2021 1308   RBC 4.47 03/17/2023 1200   RBC 4.62 02/01/2021 1308   HGB 13.5 03/17/2023 1200   HGB 12.2 10/16/2008 0914   HCT 41.3 03/17/2023 1200   HCT 36.1 10/16/2008 0914   PLT 195 03/17/2023 1200   MCV 92 03/17/2023 1200   MCV 86.4 10/16/2008 0914   MCH 30.2 03/17/2023 1200   MCH 30.3 02/01/2021 1308  MCHC 32.7 03/17/2023 1200   MCHC 33.3 02/01/2021 1308   RDW 12.2 03/17/2023 1200   RDW 13.9 10/16/2008 0914   Iron Studies    Component Value Date/Time   IRON 98 12/29/2011 1654   TIBC 369 12/29/2011 1654   FERRITIN 11 10/16/2008 0914   IRONPCTSAT 27 12/29/2011 1654   Lipid Panel     Component Value Date/Time   CHOL 126 04/06/2023 1059   TRIG 59 04/06/2023 1059   HDL 48 04/06/2023 1059   CHOLHDL 2.6 04/06/2023 1059   CHOLHDL 3.2 03/25/2015 0905   VLDL 16 03/25/2015 0905   LDLCALC 65 04/06/2023 1059   Hepatic Function Panel     Component Value Date/Time   PROT 6.4 04/06/2023 1059   ALBUMIN 4.2 04/06/2023  1059   AST 18 04/06/2023 1059   ALT 9 04/06/2023 1059   ALKPHOS 78 04/06/2023 1059   BILITOT 0.5 04/06/2023 1059   BILIDIR 0.17 04/06/2023 1059      Component Value Date/Time   TSH 2.420 03/17/2023 1200   Nutritional Lab Results  Component Value Date   VD25OH 25.5 (L) 03/17/2023   VD25OH 42 03/25/2015   VD25OH 46 01/11/2014     Assessment and Plan Assessment & Plan Obesity   She is adhering to a category two eating plan 95% of the time and engaging in physical activity, including walking and weightlifting, for 30 minutes four times per week. She has lost four pounds in the last two weeks, indicating progress in weight management. Hunger is well-regulated, and she is managing her eating habits effectively despite emotional challenges. She recognizes the importance of food as medicine and is committed to maintaining her regimen.   - Continue current eating plan and exercise regimen   - Encourage monitoring of hunger signals and appropriate eating   - Discuss emotional support and coping strategies for grief to prevent impact on eating habits    Grieving   She is experiencing grief related to the loss of her brother seven years ago. She has had some counseling in the past and is learning to allow herself to grieve without becoming overwhelmed. Emotional support from family is important, and she is working on communicating her needs to them. She acknowledges the impact of grief on her eating habits, noting a tendency to eat less when feeling weighed down.   - Offer referral for grief counseling if needed   - Encourage open communication with family about her emotional needs    Vitamin D deficiency   She requires a refill for her vitamin D supplementation, indicating ongoing management of vitamin D deficiency.   - Refill vitamin D prescription      She was informed of the importance of frequent follow up visits to maximize her success with intensive lifestyle modifications for her  multiple health conditions.    Quillian Quince, MD

## 2023-05-05 ENCOUNTER — Other Ambulatory Visit (HOSPITAL_COMMUNITY): Payer: Self-pay

## 2023-05-14 ENCOUNTER — Other Ambulatory Visit: Payer: Self-pay | Admitting: Internal Medicine

## 2023-05-14 DIAGNOSIS — Z1231 Encounter for screening mammogram for malignant neoplasm of breast: Secondary | ICD-10-CM

## 2023-05-20 ENCOUNTER — Encounter (HOSPITAL_BASED_OUTPATIENT_CLINIC_OR_DEPARTMENT_OTHER): Payer: Self-pay

## 2023-05-24 ENCOUNTER — Other Ambulatory Visit (HOSPITAL_COMMUNITY): Payer: Self-pay

## 2023-05-24 MED ORDER — ALPRAZOLAM 0.5 MG PO TABS
0.5000 mg | ORAL_TABLET | Freq: Two times a day (BID) | ORAL | 2 refills | Status: AC
  Filled 2023-05-24: qty 60, 30d supply, fill #0

## 2023-05-27 ENCOUNTER — Other Ambulatory Visit (HOSPITAL_COMMUNITY): Payer: Self-pay

## 2023-06-01 ENCOUNTER — Encounter (INDEPENDENT_AMBULATORY_CARE_PROVIDER_SITE_OTHER): Payer: Self-pay | Admitting: Family Medicine

## 2023-06-01 ENCOUNTER — Other Ambulatory Visit (HOSPITAL_COMMUNITY): Payer: Self-pay

## 2023-06-01 ENCOUNTER — Ambulatory Visit (INDEPENDENT_AMBULATORY_CARE_PROVIDER_SITE_OTHER): Admitting: Family Medicine

## 2023-06-01 VITALS — BP 129/79 | HR 59 | Temp 97.9°F | Ht 64.0 in | Wt 219.0 lb

## 2023-06-01 DIAGNOSIS — E785 Hyperlipidemia, unspecified: Secondary | ICD-10-CM | POA: Diagnosis not present

## 2023-06-01 DIAGNOSIS — I10 Essential (primary) hypertension: Secondary | ICD-10-CM | POA: Diagnosis not present

## 2023-06-01 DIAGNOSIS — E669 Obesity, unspecified: Secondary | ICD-10-CM

## 2023-06-01 DIAGNOSIS — E7849 Other hyperlipidemia: Secondary | ICD-10-CM

## 2023-06-01 DIAGNOSIS — Z6837 Body mass index (BMI) 37.0-37.9, adult: Secondary | ICD-10-CM | POA: Diagnosis not present

## 2023-06-01 DIAGNOSIS — Z789 Other specified health status: Secondary | ICD-10-CM

## 2023-06-01 DIAGNOSIS — E559 Vitamin D deficiency, unspecified: Secondary | ICD-10-CM

## 2023-06-01 MED ORDER — VITAMIN D (ERGOCALCIFEROL) 1.25 MG (50000 UNIT) PO CAPS
50000.0000 [IU] | ORAL_CAPSULE | ORAL | 0 refills | Status: DC
  Filled 2023-06-01: qty 4, 28d supply, fill #0

## 2023-06-01 NOTE — Progress Notes (Signed)
 Office: [phone]  /  Fax: [phone]  WEIGHT SUMMARY AND BIOMETRICS  Anthropometric Measurements Height: 5\' 4"  (1.626 m) Weight: 219 lb (99.3 kg) BMI (Calculated): 37.57 Weight at Last Visit: 220 lb Weight Lost Since Last Visit: 1 lb Weight Gained Since Last Visit: 0 Starting Weight: 229 lb Total Weight Loss (lbs): 10 lb (4.536 kg) Peak Weight: 248 lb   Body Composition  Body Fat %: 44.9 % Fat Mass (lbs): 98.6 lbs Muscle Mass (lbs): 115 lbs Total Body Water (lbs): 78.6 lbs Visceral Fat Rating : 13   Other Clinical Data Fasting: No Labs: Yes Today's Visit #: 5 Starting Date: 03/16/23    Chief Complaint: OBESITY   History of Present Illness Jodi Bates is a 58 year old female who presents for obesity treatment plan assessment and progress evaluation.  She has been adhering to a category two eating plan about ninety percent of the time and engages in physical activity by walking for thirty minutes, two times per week. She has lost one pound in the last month since her last visit.  She has a history of hypertension and is currently taking losartan  and amiodarone. Her blood pressure is controlled at 129/79 mmHg. She is working on weight loss to further improve her health, including her blood pressure.  She has a history of vitamin D  deficiency and is on prescription vitamin D , for which she requests a refill today.  She reports experiencing additional stressors recently, including increased work and home responsibilities, and a recent illness after traveling to [place]  where her husband was sick. Her cholesterol medication was changed due to side effects, including leg, foot, arm, and hand pain. She stopped taking rosuvastatin  about two weeks ago due to these side effects and is awaiting a new treatment plan for her hyperlipidemia.  She has been with her current employer for 31 years and recently moved to a new work location, which she found initially  stressful but ultimately a positive change. She has been managing her stress well despite these changes.      PHYSICAL EXAM:  Blood pressure 129/79, pulse (!) 59, temperature 97.9 F (36.6 C), height 5\' 4"  (1.626 m), weight 219 lb (99.3 kg), last menstrual period 06/11/2016, SpO2 99%. Body mass index is 37.59 kg/m.  DIAGNOSTIC DATA REVIEWED:  BMET    Component Value Date/Time   NA 142 03/17/2023 1200   K 4.2 03/17/2023 1200   CL 105 03/17/2023 1200   CO2 22 03/17/2023 1200   GLUCOSE 90 03/17/2023 1200   GLUCOSE 123 (H) 02/01/2021 1308   BUN 18 03/17/2023 1200   CREATININE 0.85 03/17/2023 1200   CREATININE 0.73 12/14/2019 1602   CALCIUM  9.1 03/17/2023 1200   GFRNONAA >60 02/01/2021 1308   GFRNONAA 93 12/14/2019 1602   GFRAA 108 12/14/2019 1602   Lab Results  Component Value Date   HGBA1C 5.9 (H) 03/17/2023   HGBA1C 5.9 (H) 12/29/2011   Lab Results  Component Value Date   INSULIN  10.1 03/17/2023   Lab Results  Component Value Date   TSH 2.420 03/17/2023   CBC    Component Value Date/Time   WBC 3.2 (L) 03/17/2023 1200   WBC 5.6 02/01/2021 1308   RBC 4.47 03/17/2023 1200   RBC 4.62 02/01/2021 1308   HGB 13.5 03/17/2023 1200   HGB 12.2 10/16/2008 0914   HCT 41.3 03/17/2023 1200   HCT 36.1 10/16/2008 0914   PLT 195 03/17/2023 1200   MCV 92 03/17/2023 1200  MCV 86.4 10/16/2008 0914   MCH 30.2 03/17/2023 1200   MCH 30.3 02/01/2021 1308   MCHC 32.7 03/17/2023 1200   MCHC 33.3 02/01/2021 1308   RDW 12.2 03/17/2023 1200   RDW 13.9 10/16/2008 0914   Iron Studies    Component Value Date/Time   IRON 98 12/29/2011 1654   TIBC 369 12/29/2011 1654   FERRITIN 11 10/16/2008 0914   IRONPCTSAT 27 12/29/2011 1654   Lipid Panel     Component Value Date/Time   CHOL 126 04/06/2023 1059   TRIG 59 04/06/2023 1059   HDL 48 04/06/2023 1059   CHOLHDL 2.6 04/06/2023 1059   CHOLHDL 3.2 03/25/2015 0905   VLDL 16 03/25/2015 0905   LDLCALC 65 04/06/2023 1059   Hepatic  Function Panel     Component Value Date/Time   PROT 6.4 04/06/2023 1059   ALBUMIN 4.2 04/06/2023 1059   AST 18 04/06/2023 1059   ALT 9 04/06/2023 1059   ALKPHOS 78 04/06/2023 1059   BILITOT 0.5 04/06/2023 1059   BILIDIR 0.17 04/06/2023 1059      Component Value Date/Time   TSH 2.420 03/17/2023 1200   Nutritional Lab Results  Component Value Date   VD25OH 25.5 (L) 03/17/2023   VD25OH 42 03/25/2015   VD25OH 46 01/11/2014     Assessment and Plan Assessment & Plan Hypertension Blood pressure is well-controlled at 129/79 mmHg. Continued focus on weight loss and exercise may allow for reduction in antihypertensive medication in the future. - Continue current antihypertensive medications: losartan  and amiodarone - Encourage continued weight loss and exercise to potentially reduce medication in the future  Hyperlipidemia Previous treatment with rosuvastatin  resulted in leg, foot, arm, and hand pain, leading to discontinuation. Cholesterol levels improved on rosuvastatin , but current levels are unknown. Exploring alternative treatments, including potential injectable options, if criteria are met. - Explore alternative cholesterol-lowering treatments, including potential injectable options - Continue dietary modifications and exercise to manage cholesterol levels  Statin intolerance Intolerance to statins due to muscle pain. Diagnosis of statin intolerance added to chart for future reference. - Document statin intolerance in medical chart  Obesity Following category two eating plan with 90% adherence. Engaging in walking exercise twice a week for 30 minutes. Lost one pound in the last month. Discussed protein-rich snack options to aid in weight management. - Continue category two eating plan - Increase exercise frequency and intensity, consider adding ankle weights - Incorporate protein-rich snacks into diet  Vitamin D  deficiency Vitamin D  deficiency managed with prescription  vitamin D . Refill requested and approved. - Refill prescription vitamin D     She was informed of the importance of frequent follow up visits to maximize her success with intensive lifestyle modifications for her multiple health conditions.    Jasmine Mesi, MD

## 2023-06-02 ENCOUNTER — Other Ambulatory Visit: Payer: Self-pay | Admitting: Allergy and Immunology

## 2023-06-03 ENCOUNTER — Encounter (HOSPITAL_COMMUNITY): Payer: Self-pay

## 2023-06-03 ENCOUNTER — Other Ambulatory Visit (HOSPITAL_COMMUNITY): Payer: Self-pay

## 2023-06-04 ENCOUNTER — Other Ambulatory Visit (HOSPITAL_COMMUNITY): Payer: Self-pay

## 2023-06-07 ENCOUNTER — Other Ambulatory Visit (HOSPITAL_COMMUNITY): Payer: Self-pay

## 2023-06-14 ENCOUNTER — Other Ambulatory Visit (HOSPITAL_BASED_OUTPATIENT_CLINIC_OR_DEPARTMENT_OTHER): Payer: Self-pay

## 2023-06-21 NOTE — Patient Instructions (Incomplete)
  1. Continue to Treat and prevent inflammation:   A. Flonase 1-2 sprays each nostril 1-7 times per week depending on disease activity  B. Breo 100 - 1 inhalation 1- 7 times per week depending on disease activity  2. If needed:   A. Ventolin HFA 2 puffs every 4-6 hours  B. OTC antihistamine - Claritin/Allegra/Zyrtec  3.  Return to clinic in 12 months or earlier if problem

## 2023-06-22 ENCOUNTER — Encounter (INDEPENDENT_AMBULATORY_CARE_PROVIDER_SITE_OTHER): Payer: Self-pay | Admitting: Family Medicine

## 2023-06-22 ENCOUNTER — Ambulatory Visit (INDEPENDENT_AMBULATORY_CARE_PROVIDER_SITE_OTHER): Admitting: Family Medicine

## 2023-06-22 ENCOUNTER — Encounter: Payer: Self-pay | Admitting: Family

## 2023-06-22 ENCOUNTER — Other Ambulatory Visit: Payer: Self-pay

## 2023-06-22 ENCOUNTER — Other Ambulatory Visit (HOSPITAL_COMMUNITY): Payer: Self-pay

## 2023-06-22 ENCOUNTER — Ambulatory Visit: Admitting: Family

## 2023-06-22 VITALS — BP 120/80 | HR 77 | Temp 98.1°F | Resp 20 | Ht 64.0 in | Wt 221.5 lb

## 2023-06-22 VITALS — BP 111/78 | HR 68 | Temp 97.9°F | Ht 64.0 in | Wt 218.0 lb

## 2023-06-22 DIAGNOSIS — R7303 Prediabetes: Secondary | ICD-10-CM

## 2023-06-22 DIAGNOSIS — J4540 Moderate persistent asthma, uncomplicated: Secondary | ICD-10-CM

## 2023-06-22 DIAGNOSIS — H1013 Acute atopic conjunctivitis, bilateral: Secondary | ICD-10-CM

## 2023-06-22 DIAGNOSIS — E669 Obesity, unspecified: Secondary | ICD-10-CM | POA: Diagnosis not present

## 2023-06-22 DIAGNOSIS — Z6837 Body mass index (BMI) 37.0-37.9, adult: Secondary | ICD-10-CM

## 2023-06-22 DIAGNOSIS — E66812 Obesity, class 2: Secondary | ICD-10-CM

## 2023-06-22 DIAGNOSIS — F5089 Other specified eating disorder: Secondary | ICD-10-CM | POA: Diagnosis not present

## 2023-06-22 DIAGNOSIS — E559 Vitamin D deficiency, unspecified: Secondary | ICD-10-CM | POA: Diagnosis not present

## 2023-06-22 DIAGNOSIS — F3289 Other specified depressive episodes: Secondary | ICD-10-CM

## 2023-06-22 DIAGNOSIS — J3089 Other allergic rhinitis: Secondary | ICD-10-CM | POA: Diagnosis not present

## 2023-06-22 MED ORDER — VITAMIN D (ERGOCALCIFEROL) 1.25 MG (50000 UNIT) PO CAPS
50000.0000 [IU] | ORAL_CAPSULE | ORAL | 0 refills | Status: DC
  Filled 2023-06-22 – 2023-06-23 (×2): qty 4, 28d supply, fill #0

## 2023-06-22 MED ORDER — ALBUTEROL SULFATE HFA 108 (90 BASE) MCG/ACT IN AERS
2.0000 | INHALATION_SPRAY | RESPIRATORY_TRACT | 1 refills | Status: DC | PRN
  Filled 2023-06-22: qty 6.7, 25d supply, fill #0

## 2023-06-22 MED ORDER — BREO ELLIPTA 100-25 MCG/ACT IN AEPB
1.0000 | INHALATION_SPRAY | RESPIRATORY_TRACT | 5 refills | Status: DC
  Filled 2023-06-22: qty 60, 30d supply, fill #0
  Filled 2023-10-02: qty 60, 30d supply, fill #1

## 2023-06-22 MED ORDER — FLUTICASONE PROPIONATE 50 MCG/ACT NA SUSP
1.0000 | NASAL | 5 refills | Status: DC
  Filled 2023-06-22: qty 16, 30d supply, fill #0
  Filled 2023-10-02: qty 16, 30d supply, fill #1
  Filled 2023-11-30: qty 16, 30d supply, fill #2

## 2023-06-22 MED ORDER — ALBUTEROL SULFATE (2.5 MG/3ML) 0.083% IN NEBU
3.0000 mL | INHALATION_SOLUTION | Freq: Four times a day (QID) | RESPIRATORY_TRACT | 1 refills | Status: AC | PRN
  Filled 2023-06-22: qty 75, 7d supply, fill #0

## 2023-06-22 NOTE — Progress Notes (Signed)
 [address] [place] [address] Dept: [phone]  FOLLOW UP NOTE  Patient ID: Jodi Bates, female    DOB: Nov 13, 1965  Age: 58 y.o. MRN: [id number] Date of Office Visit: 06/22/2023  Assessment  Chief Complaint: Medication Refill and Follow-up (No concerns)  HPI Jodi Bates is a 58 year old female who presents today for follow-up of well-controlled moderate persistent asthma and allergic rhinitis.  She was last seen on April 29, 2021 by Dr. Jerelene Monday.  She denies any new diagnosis or surgery since her last office visit.  Asthma: She continues to use Breo 100 mcg 1 puff once a day only when her asthma flares.  She mentions over [place] she had a cold that caused her to cough.  She was on a trip to [place]  with her husband and he was sick and passed it onto her.  During that time she used her Breo and her albuterol  along with her allergy pills and this helped.  Otherwise she denies cough, wheeze, tightness in chest, shortness of breath, and nocturnal awakenings due to breathing problems.  She denies any trips to the emergency room or urgent care due to breathing problems since her last office visit.  She does report that she has received 1 round of steroids last October due to an upper respiratory infection.  Also in November she reports that she had an upper respiratory infection, but did not take prednisone .  She hardly ever uses her albuterol .  The last time she used albuterol  was Easter.  She mentions that in the fall her asthma tends to be worse.  Allergic rhinitis: She reports seldom postnasal drip and denies rhinorrhea and nasal congestion.  She does take Claritin  daily and uses Flonase  nasal spray as needed.  She mentions that sometimes she will have blood in her nose after using Flonase .  Reviewed proper technique for Flonase  nasal spray and she is using proper technique.  She reports itchy eyes at times also, but does not feel like she needs an eyedrop.   Drug  Allergies:  Allergies  Allergen Reactions   Adhesive [Tape] Itching and Other (See Comments)    Reaction:  Blisters    Latex Itching, Rash and Other (See Comments)   Soap Other (See Comments)    Soaps that contain any kind of fragrance triggers an asthma attack.     Hydrochlorothiazide      Hypokalemia   Other Cough    Fragrances     Review of Systems: Negative except as per HPI  Physical Exam: BP 120/80   Pulse 77   Temp 98.1 F (36.7 C)   Resp 20   Ht 5\' 4"  (1.626 m)   Wt 221 lb 8 oz (100.5 kg)   LMP 06/11/2016 Comment: irregular   SpO2 97%   BMI 38.02 kg/m    Physical Exam Constitutional:      Appearance: Normal appearance.  HENT:     Head: Normocephalic and atraumatic.     Comments: Pharynx normal, eyes normal, ears normal, nose: Bilateral lower turbinates mildly edematous and slightly erythematous with no drainage noted    Right Ear: Tympanic membrane, ear canal and external ear normal.     Left Ear: Tympanic membrane, ear canal and external ear normal.     Mouth/Throat:     Mouth: Mucous membranes are moist.     Pharynx: Oropharynx is clear.  Eyes:     Conjunctiva/sclera: Conjunctivae normal.  Cardiovascular:     Rate and Rhythm: Regular  rhythm.     Heart sounds: Normal heart sounds.  Pulmonary:     Effort: Pulmonary effort is normal.     Breath sounds: Normal breath sounds.     Comments: Lungs clear to auscultation Musculoskeletal:     Cervical back: Neck supple.  Skin:    General: Skin is warm.  Neurological:     Mental Status: She is alert and oriented to person, place, and time.  Psychiatric:        Mood and Affect: Mood normal.        Behavior: Behavior normal.        Thought Content: Thought content normal.        Judgment: Judgment normal.     Diagnostics: FVC 2.26 L (81%), FEV1 1.79 L (81%), FEV1/FVC 0.79.  Spirometry indicates normal spirometry.  Assessment and Plan: 1. Other allergic rhinitis   2. Moderate persistent asthma without  complication   3. Allergic conjunctivitis of both eyes     Meds ordered this encounter  Medications   albuterol  (PROVENTIL ) (2.5 MG/3ML) 0.083% nebulizer solution    Sig: Take 3 mLs by nebulization every 6 (six) hours as needed for wheezing or shortness of breath.    Dispense:  75 mL    Refill:  1   albuterol  (VENTOLIN  HFA) 108 (90 Base) MCG/ACT inhaler    Sig: Inhale 2 puffs into the lungs every 4 (four) hours as needed for wheezing or shortness of breath.    Dispense:  6.7 g    Refill:  1   BREO ELLIPTA  100-25 MCG/ACT AEPB    Sig: Inhale 1 puff into the lungs 1 to 7 times per week as needed    Dispense:  60 each    Refill:  5   fluticasone  (FLONASE ) 50 MCG/ACT nasal spray    Sig: Place 1-2 sprays into both nostrils 1 to 7 times per week as needed    Dispense:  16 g    Refill:  5    Patient Instructions     1. Continue to Treat and prevent inflammation:             A. Flonase  1-2 sprays each nostril 1-7 times per week depending on disease activity           B. Breo 100 - 1 inhalation 1- 7 times per week depending on disease activity. Consider taking daily during the fall   2. If needed:             A. Ventolin  HFA 2 puffs every 4-6 hours           B. OTC antihistamine - Claritin /Allegra /Zyrtec   3.  Return to clinic in 6 months or earlier if problem      Return in about 6 months (around 12/23/2023), or if symptoms worsen or fail to improve.    Thank you for the opportunity to care for this patient.  Please do not hesitate to contact me with questions.  Tinnie Forehand, FNP [facility] 

## 2023-06-22 NOTE — Progress Notes (Signed)
 Office: [phone]  /  Fax: [phone]  WEIGHT SUMMARY AND BIOMETRICS  Anthropometric Measurements Height: 5\' 4"  (1.626 m) Weight: 218 lb (98.9 kg) BMI (Calculated): 37.4 Weight at Last Visit: 219 lb Weight Lost Since Last Visit: 1 lb Weight Gained Since Last Visit: 0 Starting Weight: 229 lb Total Weight Loss (lbs): 11 lb (4.99 kg) Peak Weight: 248 lb   Body Composition  Body Fat %: 44.6 % Fat Mass (lbs): 97.4 lbs Muscle Mass (lbs): 114.6 lbs Total Body Water (lbs): 79.2 lbs Visceral Fat Rating : 13   Other Clinical Data Fasting: no Labs: no Today's Visit #: 6 Starting Date: 03/16/23    Chief Complaint: OBESITY    History of Present Illness Jodi Bates is a 58 year old female with obesity who presents for treatment and progress assessment.  She follows her category two prescribed eating plan 90% of the time and engages in physical activity by walking 30 minutes three times a week. Despite these efforts, she has lost only one pound in the last three weeks.  She has a history of emotional eating behaviors and is currently taking Lexapro  and topiramate  to help manage this. Topiramate , taken at 25 mg at bedtime, is also effective in managing her migraines without causing daytime grogginess.  She experiences increased hunger, particularly between breakfast and lunch, which she attributes to not measuring or weighing her food as often. She typically eats breakfast at 6 AM and feels hungry by 10 AM.  She is currently taking vitamin D  50,000 IU weekly and reports no nausea or other side effects. No current migraine symptoms.      PHYSICAL EXAM:  Blood pressure 111/78, pulse 68, temperature 97.9 F (36.6 C), height 5\' 4"  (1.626 m), weight 218 lb (98.9 kg), last menstrual period 06/11/2016, SpO2 98%. Body mass index is 37.42 kg/m.  DIAGNOSTIC DATA REVIEWED:  BMET    Component Value Date/Time   NA 142 03/17/2023 1200   K 4.2 03/17/2023 1200   CL  105 03/17/2023 1200   CO2 22 03/17/2023 1200   GLUCOSE 90 03/17/2023 1200   GLUCOSE 123 (H) 02/01/2021 1308   BUN 18 03/17/2023 1200   CREATININE 0.85 03/17/2023 1200   CREATININE 0.73 12/14/2019 1602   CALCIUM  9.1 03/17/2023 1200   GFRNONAA >60 02/01/2021 1308   GFRNONAA 93 12/14/2019 1602   GFRAA 108 12/14/2019 1602   Lab Results  Component Value Date   HGBA1C 5.9 (H) 03/17/2023   HGBA1C 5.9 (H) 12/29/2011   Lab Results  Component Value Date   INSULIN  10.1 03/17/2023   Lab Results  Component Value Date   TSH 2.420 03/17/2023   CBC    Component Value Date/Time   WBC 3.2 (L) 03/17/2023 1200   WBC 5.6 02/01/2021 1308   RBC 4.47 03/17/2023 1200   RBC 4.62 02/01/2021 1308   HGB 13.5 03/17/2023 1200   HGB 12.2 10/16/2008 0914   HCT 41.3 03/17/2023 1200   HCT 36.1 10/16/2008 0914   PLT 195 03/17/2023 1200   MCV 92 03/17/2023 1200   MCV 86.4 10/16/2008 0914   MCH 30.2 03/17/2023 1200   MCH 30.3 02/01/2021 1308   MCHC 32.7 03/17/2023 1200   MCHC 33.3 02/01/2021 1308   RDW 12.2 03/17/2023 1200   RDW 13.9 10/16/2008 0914   Iron Studies    Component Value Date/Time   IRON 98 12/29/2011 1654   TIBC 369 12/29/2011 1654   FERRITIN 11 10/16/2008 0914   IRONPCTSAT 27 12/29/2011  1654   Lipid Panel     Component Value Date/Time   CHOL 126 04/06/2023 1059   TRIG 59 04/06/2023 1059   HDL 48 04/06/2023 1059   CHOLHDL 2.6 04/06/2023 1059   CHOLHDL 3.2 03/25/2015 0905   VLDL 16 03/25/2015 0905   LDLCALC 65 04/06/2023 1059   Hepatic Function Panel     Component Value Date/Time   PROT 6.4 04/06/2023 1059   ALBUMIN 4.2 04/06/2023 1059   AST 18 04/06/2023 1059   ALT 9 04/06/2023 1059   ALKPHOS 78 04/06/2023 1059   BILITOT 0.5 04/06/2023 1059   BILIDIR 0.17 04/06/2023 1059      Component Value Date/Time   TSH 2.420 03/17/2023 1200   Nutritional Lab Results  Component Value Date   VD25OH 25.5 (L) 03/17/2023   VD25OH 42 03/25/2015   VD25OH 46 01/11/2014      Assessment and Plan Assessment & Plan Obesity Obesity management with dietary modifications and physical activity. She adheres to a category two eating plan 90% of the time and walks 30 minutes three times weekly, resulting in a one-pound weight loss over three weeks. Discussed challenges with diet maintenance during social events and strategies such as ordering healthier options and adjusting portion sizes. - Continue category two eating plan. - Continue walking 30 minutes three times weekly.  Emotional eating behaviors Emotional eating behaviors managed with topiramate  25 mg at bedtime, aiding in migraine symptom relief and reducing food temptations. Discussed topiramate 's subtle effects in diminishing the importance of tempting foods, facilitating better food choices without deprivation. - Continue topiramate  25 mg at bedtime. - Implement strategies for managing emotional eating, including making better food choices without feeling deprived.  Prediabetes Prediabetes managed with dietary and exercise interventions. Discussed hunger management strategies, including a potential mid-morning snack to manage hunger between meals. Consider metformin if polyphagia increases or fails to improve. Emphasized protein intake and timing for effective hunger management. - Continue current diet and exercise regimen. - Consider a planned mid-morning snack to manage hunger. - Monitor hunger levels and consider metformin if polyphagia increases or fails to improve.  Vitamin D  deficiency Vitamin D  deficiency managed with supplementation. No reported side effects from current regimen. - Refill vitamin D  50,000 IU weekly. - Recheck labs in a couple of months.  Follow-up Scheduled follow-up appointments to monitor progress and adjust treatment plans as necessary. - Follow-up appointment on July 20, 2023, at 10:40 AM. - Follow-up appointment on August 10, 2023, at 10:00 AM. - Schedule follow-up  appointment for after September 05, 2023.      She was informed of the importance of frequent follow up visits to maximize her success with intensive lifestyle modifications for her multiple health conditions.    Jasmine Mesi, MD

## 2023-06-23 ENCOUNTER — Other Ambulatory Visit: Payer: Self-pay

## 2023-06-23 ENCOUNTER — Other Ambulatory Visit (HOSPITAL_COMMUNITY): Payer: Self-pay

## 2023-06-23 NOTE — Addendum Note (Signed)
 Addended by: Norville Beery, Saadia Dewitt on: 06/23/2023 05:20 PM   Modules accepted: Orders

## 2023-06-25 ENCOUNTER — Ambulatory Visit
Admission: RE | Admit: 2023-06-25 | Discharge: 2023-06-25 | Disposition: A | Discharge status: Home / Self Care | Payer: Self-pay | Source: Ambulatory Visit

## 2023-06-25 DIAGNOSIS — Z1231 Encounter for screening mammogram for malignant neoplasm of breast: Secondary | ICD-10-CM | POA: Diagnosis not present

## 2023-06-30 ENCOUNTER — Other Ambulatory Visit (HOSPITAL_COMMUNITY): Payer: Self-pay

## 2023-06-30 MED ORDER — LATANOPROST 0.005 % OP SOLN
1.0000 [drp] | Freq: Every day | OPHTHALMIC | 11 refills | Status: AC
  Filled 2023-06-30: qty 2.5, 25d supply, fill #0
  Filled 2023-07-26: qty 2.5, 25d supply, fill #1
  Filled 2023-08-27: qty 2.5, 25d supply, fill #2
  Filled 2023-10-02: qty 2.5, 25d supply, fill #3
  Filled 2023-10-25: qty 2.5, 25d supply, fill #4
  Filled 2023-11-30: qty 2.5, 25d supply, fill #5

## 2023-07-13 ENCOUNTER — Ambulatory Visit (HOSPITAL_BASED_OUTPATIENT_CLINIC_OR_DEPARTMENT_OTHER): Admitting: Family

## 2023-07-13 ENCOUNTER — Other Ambulatory Visit (HOSPITAL_COMMUNITY): Payer: Self-pay

## 2023-07-13 VITALS — BP 120/80 | HR 57 | Ht 64.0 in | Wt 221.0 lb

## 2023-07-13 DIAGNOSIS — I10 Essential (primary) hypertension: Secondary | ICD-10-CM

## 2023-07-13 DIAGNOSIS — I700 Atherosclerosis of aorta: Secondary | ICD-10-CM | POA: Diagnosis not present

## 2023-07-13 DIAGNOSIS — E782 Mixed hyperlipidemia: Secondary | ICD-10-CM | POA: Diagnosis not present

## 2023-07-13 MED ORDER — AMLODIPINE BESYLATE 10 MG PO TABS
10.0000 mg | ORAL_TABLET | Freq: Every day | ORAL | 3 refills | Status: AC
  Filled 2023-07-13 – 2023-09-14 (×2): qty 90, 90d supply, fill #0
  Filled 2023-12-09: qty 90, 90d supply, fill #1

## 2023-07-13 MED ORDER — SPIRONOLACTONE 25 MG PO TABS
25.0000 mg | ORAL_TABLET | Freq: Every day | ORAL | 3 refills | Status: AC
  Filled 2023-07-13: qty 90, 90d supply, fill #0
  Filled 2023-10-25: qty 90, 90d supply, fill #1
  Filled 2024-02-07: qty 90, 90d supply, fill #2
  Filled 2024-02-13: qty 90, 90d supply, fill #0

## 2023-07-13 MED ORDER — LOSARTAN POTASSIUM 50 MG PO TABS
50.0000 mg | ORAL_TABLET | Freq: Every day | ORAL | 3 refills | Status: AC
  Filled 2023-07-13: qty 90, 90d supply, fill #0
  Filled 2023-10-25: qty 90, 90d supply, fill #1
  Filled 2024-02-07: qty 90, 90d supply, fill #2

## 2023-07-13 NOTE — Patient Instructions (Signed)
 Medication Instructions:  Your physician recommends that you continue on your current medications as directed. Please refer to the Current Medication list given to you today.  Follow-Up: At [facility], you and your health needs are our priority.  As part of our continuing mission to provide you with exceptional heart care, our providers are all part of one team.  This team includes your primary Cardiologist (physician) and Advanced Practice Providers or APPs (Physician Assistants and Nurse Practitioners) who all work together to provide you with the care you need, when you need it.  Your next appointment:   1 year with Dr. [name] or [name], NP

## 2023-07-14 ENCOUNTER — Other Ambulatory Visit: Payer: Self-pay

## 2023-07-19 ENCOUNTER — Encounter (HOSPITAL_BASED_OUTPATIENT_CLINIC_OR_DEPARTMENT_OTHER): Payer: Self-pay | Admitting: Family

## 2023-07-19 NOTE — Progress Notes (Signed)
  Cardiology Office Note   Date:  07/19/2023  ID:  Keeghan, Mcintire 1965/08/04, MRN [id number] PCP: Perri Ronal PARAS, MD  [facility] Providers Cardiologist:  Annabella Scarce, MD     History of Present Illness Jodi Bates is a 58 y.o. female  hx of aortic atherosclerosis, migraine, hyperlipidemia, obesity, prediabetes, asthma, hypertension.  Referred 05/2016 for bradycardia, near syncope, hypertension. Initial episode bradycardia 01/2016 in setting of taking Nebivolol  which was discontinued. Furosemide  switched to HCTZ for BP control and Amlodipine  up titrated. At visit 02/14/21 she was started on weogfy and referred to PREP program. 02/2021 potassium was low so HCTZ was switched to Spironolactone . She had successful weight loss on Wegovy  but unfortunately her insurance stopped covering the medication.   Presents today for follow-up.  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  BP by home readings has been well controlled. Reports no shortness of breath nor dyspnea on exertion. Reports no chest pain, pressure, or tightness. No edema, orthopnea, PND. Reports no palpitations.  Reports rare lightheadedness or dizziness which self resolves. She cotninues to exercise regularly and follow a heart healthy diet. She is following with [facility]. She does note that both Atorvastatin and Rosuvastatin  caused side effects with muscle aches.   ROS: Please see the history of present illness.    All other systems reviewed and are negative.   Studies Reviewed          Risk Assessment/Calculations          Physical Exam VS:  BP 120/80 (BP Location: Left Arm)   Pulse (!) 57   Ht 5' 4 (1.626 m)   Wt 221 lb (100.2 kg)   LMP 06/11/2016 Comment: irregular   SpO2 98%   BMI 37.93 kg/m        Wt Readings from Last 3 Encounters:  07/13/23 221 lb (100.2 kg)  06/22/23 218 lb (98.9 kg)  06/22/23 221 lb 8 oz (100.5 kg)    GEN: Well nourished, well developed  in no acute distress NECK: No JVD; No carotid bruits CARDIAC: RRR, no murmurs, rubs, gallops RESPIRATORY:  Clear to auscultation without rales, wheezing or rhonchi  ABDOMEN: Soft, non-tender, non-distended EXTREMITIES:  No edema; No deformity   ASSESSMENT AND PLAN   HTN - BP well controlled. Continue current antihypertensive regimen amlodipine  10mg  daily, losartan  50mg  daily, spironolactone  25mg  daily. Discussed to monitor BP at home at least 2 hours after medications and sitting for 5-10 minutes.   Obesity / Prediabetes - Insurance unfortunately does not cover Wegovy  for weight loss. Follows with [facility]. Recommend aiming for 150 minutes of moderate intensity activity per week and following a heart healthy diet.    Aortic atherosclerosis / Hyperlipidemia -Did not tolerate rosuvastatin  nor atorvastatin with muscle aches. Plan to continue lifestyle changes and reassess lipid panel in 3 months. Will provide info on Zetia and Bempedoic acid for consideration if LDL not at goal <70 at that time.       Dispo: follow up in 1 year  Signed, Reche GORMAN Finder, NP

## 2023-07-20 ENCOUNTER — Ambulatory Visit (INDEPENDENT_AMBULATORY_CARE_PROVIDER_SITE_OTHER): Admitting: Family Medicine

## 2023-07-20 ENCOUNTER — Other Ambulatory Visit (HOSPITAL_COMMUNITY): Payer: Self-pay

## 2023-07-20 ENCOUNTER — Encounter (INDEPENDENT_AMBULATORY_CARE_PROVIDER_SITE_OTHER): Payer: Self-pay | Admitting: Family Medicine

## 2023-07-20 VITALS — BP 128/86 | HR 60 | Temp 98.4°F | Ht 64.0 in | Wt 219.0 lb

## 2023-07-20 DIAGNOSIS — G479 Sleep disorder, unspecified: Secondary | ICD-10-CM | POA: Insufficient documentation

## 2023-07-20 DIAGNOSIS — Z6837 Body mass index (BMI) 37.0-37.9, adult: Secondary | ICD-10-CM | POA: Diagnosis not present

## 2023-07-20 DIAGNOSIS — Z6839 Body mass index (BMI) 39.0-39.9, adult: Secondary | ICD-10-CM | POA: Insufficient documentation

## 2023-07-20 DIAGNOSIS — E785 Hyperlipidemia, unspecified: Secondary | ICD-10-CM

## 2023-07-20 DIAGNOSIS — R7303 Prediabetes: Secondary | ICD-10-CM | POA: Diagnosis not present

## 2023-07-20 DIAGNOSIS — E669 Obesity, unspecified: Secondary | ICD-10-CM | POA: Diagnosis not present

## 2023-07-20 DIAGNOSIS — E559 Vitamin D deficiency, unspecified: Secondary | ICD-10-CM | POA: Diagnosis not present

## 2023-07-20 DIAGNOSIS — E7849 Other hyperlipidemia: Secondary | ICD-10-CM | POA: Insufficient documentation

## 2023-07-20 MED ORDER — VITAMIN D (ERGOCALCIFEROL) 1.25 MG (50000 UNIT) PO CAPS
50000.0000 [IU] | ORAL_CAPSULE | ORAL | 0 refills | Status: DC
  Filled 2023-07-20: qty 4, 28d supply, fill #0

## 2023-07-20 NOTE — Progress Notes (Signed)
 Office: [phone]  /  Fax: [phone]  WEIGHT SUMMARY AND BIOMETRICS  Anthropometric Measurements Height: 5' 4 (1.626 m) Weight: 219 lb (99.3 kg) BMI (Calculated): 37.57 Weight at Last Visit: 218 lb Weight Lost Since Last Visit: 0 Weight Gained Since Last Visit: 1 lb Starting Weight: 229 lb Total Weight Loss (lbs): 10 lb (4.536 kg) Peak Weight: 248 lb   Body Composition  Body Fat %: 45.2 % Fat Mass (lbs): 99 lbs Muscle Mass (lbs): 114.2 lbs Total Body Water (lbs): 80.2 lbs Visceral Fat Rating : 13   Other Clinical Data Fasting: No Labs: No Today's Visit #: 7 Starting Date: 03/16/23    Chief Complaint: OBESITY   History of Present Illness Jodi Bates is a 58 year old female who presents for a follow-up on her obesity treatment and progress assessment.  She adheres to her prescribed category two eating plan approximately 95% of the time and engages in a combination of walking and weightlifting for 30 minutes, three times a week. Despite these efforts, she has gained one pound since her last visit a month ago. She acknowledges not drinking as much water due to a busy work schedule, which may have contributed to fluid retention. Stress levels have been somewhat high, but she is trying to control stress eating, opting for healthier snacks like cashews, skinny pop popcorn, and apples.  She is being treated for vitamin D  deficiency and is currently taking prescription vitamin D  at a dose of 50,000 international units per week. She requested a refill for this medication during the visit.  She experiences disordered sleep, particularly since the time change, which has disrupted her sleep pattern. She estimates getting about five to five and a half hours of sleep per night. She attributes this to not going to bed at her set alarm time due to various tasks and stress.      PHYSICAL EXAM:  Blood pressure 128/86, pulse 60, temperature 98.4 F (36.9 C), height  5' 4 (1.626 m), weight 219 lb (99.3 kg), last menstrual period 06/11/2016, SpO2 97%. Body mass index is 37.59 kg/m.  DIAGNOSTIC DATA REVIEWED:  BMET    Component Value Date/Time   NA 142 03/17/2023 1200   K 4.2 03/17/2023 1200   CL 105 03/17/2023 1200   CO2 22 03/17/2023 1200   GLUCOSE 90 03/17/2023 1200   GLUCOSE 123 (H) 02/01/2021 1308   BUN 18 03/17/2023 1200   CREATININE 0.85 03/17/2023 1200   CREATININE 0.73 12/14/2019 1602   CALCIUM  9.1 03/17/2023 1200   GFRNONAA >60 02/01/2021 1308   GFRNONAA 93 12/14/2019 1602   GFRAA 108 12/14/2019 1602   Lab Results  Component Value Date   HGBA1C 5.9 (H) 03/17/2023   HGBA1C 5.9 (H) 12/29/2011   Lab Results  Component Value Date   INSULIN  10.1 03/17/2023   Lab Results  Component Value Date   TSH 2.420 03/17/2023   CBC    Component Value Date/Time   WBC 3.2 (L) 03/17/2023 1200   WBC 5.6 02/01/2021 1308   RBC 4.47 03/17/2023 1200   RBC 4.62 02/01/2021 1308   HGB 13.5 03/17/2023 1200   HGB 12.2 10/16/2008 0914   HCT 41.3 03/17/2023 1200   HCT 36.1 10/16/2008 0914   PLT 195 03/17/2023 1200   MCV 92 03/17/2023 1200   MCV 86.4 10/16/2008 0914   MCH 30.2 03/17/2023 1200   MCH 30.3 02/01/2021 1308   MCHC 32.7 03/17/2023 1200   MCHC 33.3 02/01/2021 1308  RDW 12.2 03/17/2023 1200   RDW 13.9 10/16/2008 0914   Iron Studies    Component Value Date/Time   IRON 98 12/29/2011 1654   TIBC 369 12/29/2011 1654   FERRITIN 11 10/16/2008 0914   IRONPCTSAT 27 12/29/2011 1654   Lipid Panel     Component Value Date/Time   CHOL 126 04/06/2023 1059   TRIG 59 04/06/2023 1059   HDL 48 04/06/2023 1059   CHOLHDL 2.6 04/06/2023 1059   CHOLHDL 3.2 03/25/2015 0905   VLDL 16 03/25/2015 0905   LDLCALC 65 04/06/2023 1059   Hepatic Function Panel     Component Value Date/Time   PROT 6.4 04/06/2023 1059   ALBUMIN 4.2 04/06/2023 1059   AST 18 04/06/2023 1059   ALT 9 04/06/2023 1059   ALKPHOS 78 04/06/2023 1059   BILITOT 0.5  04/06/2023 1059   BILIDIR 0.17 04/06/2023 1059      Component Value Date/Time   TSH 2.420 03/17/2023 1200   Nutritional Lab Results  Component Value Date   VD25OH 25.5 (L) 03/17/2023   VD25OH 42 03/25/2015   VD25OH 46 01/11/2014     Assessment and Plan Assessment & Plan Obesity She adheres to a category two eating plan 95% of the time and engages in walking and weightlifting for 30 minutes, three times a week. Despite these efforts, she gained one pound last month, likely due to water retention from heat. Stress and lack of sleep may also contribute to her weight management challenges. - Continue category two eating plan with additional lunch options - Increase hydration to manage fluid retention - Enhance exercise routine and improve sleep hygiene  Disordered sleep She averages 5 to 5.5 hours of sleep per night, affecting her metabolism and potentially contributing to stress eating. She plans to adhere to a set bedtime to prioritize sleep, which is crucial for long-term health, including metabolism and overall well-being. - Adhere to a regular sleep schedule with a goal of 7 hours per night - Monitor sleep patterns and reassess if needed  Prediabetes Her prediabetes is managed through diet and exercise. Upcoming lab tests, including hemoglobin A1c, will assess her current status, with anticipated improvement due to lifestyle modifications. - Recheck hemoglobin A1c at next appointment  Hyperlipidemia Her hyperlipidemia is managed through diet and exercise. Upcoming lab tests will assess her cholesterol levels. - Recheck cholesterol levels at next appointment  Vitamin D  deficiency She is on prescription vitamin D  50,000 IU weekly but has not yet reached goal levels. - Refill prescription vitamin D  50,000 IU weekly - Recheck vitamin D  levels at next appointment    She was informed of the importance of frequent follow up visits to maximize her success with intensive lifestyle  modifications for her multiple health conditions.    [name], MD

## 2023-08-10 ENCOUNTER — Other Ambulatory Visit (HOSPITAL_COMMUNITY): Payer: Self-pay

## 2023-08-10 ENCOUNTER — Encounter (INDEPENDENT_AMBULATORY_CARE_PROVIDER_SITE_OTHER): Payer: Self-pay | Admitting: Family Medicine

## 2023-08-10 ENCOUNTER — Ambulatory Visit (INDEPENDENT_AMBULATORY_CARE_PROVIDER_SITE_OTHER): Admitting: Family Medicine

## 2023-08-10 VITALS — BP 117/77 | HR 61 | Temp 97.4°F | Ht 64.0 in | Wt 216.0 lb

## 2023-08-10 DIAGNOSIS — I10 Essential (primary) hypertension: Secondary | ICD-10-CM

## 2023-08-10 DIAGNOSIS — E7849 Other hyperlipidemia: Secondary | ICD-10-CM

## 2023-08-10 DIAGNOSIS — E559 Vitamin D deficiency, unspecified: Secondary | ICD-10-CM

## 2023-08-10 DIAGNOSIS — Z6837 Body mass index (BMI) 37.0-37.9, adult: Secondary | ICD-10-CM | POA: Diagnosis not present

## 2023-08-10 DIAGNOSIS — E785 Hyperlipidemia, unspecified: Secondary | ICD-10-CM | POA: Diagnosis not present

## 2023-08-10 DIAGNOSIS — E669 Obesity, unspecified: Secondary | ICD-10-CM

## 2023-08-10 DIAGNOSIS — R7303 Prediabetes: Secondary | ICD-10-CM

## 2023-08-10 MED ORDER — VITAMIN D (ERGOCALCIFEROL) 1.25 MG (50000 UNIT) PO CAPS
50000.0000 [IU] | ORAL_CAPSULE | ORAL | 0 refills | Status: DC
Start: 2023-08-10 — End: 2023-09-07
  Filled 2023-08-10 – 2023-08-12 (×2): qty 4, 28d supply, fill #0

## 2023-08-10 NOTE — Progress Notes (Signed)
 Office: [phone]  /  Fax: [phone]  WEIGHT SUMMARY AND BIOMETRICS  Anthropometric Measurements Height: 5' 4 (1.626 m) Weight: 216 lb (98 kg) BMI (Calculated): 37.06 Weight at Last Visit: 219 lb Weight Lost Since Last Visit: 3 lb Weight Gained Since Last Visit: 0 Starting Weight: 229 lb Total Weight Loss (lbs): 13 lb (5.897 kg) Peak Weight: 248 lb   Body Composition  Body Fat %: 44.5 % Fat Mass (lbs): 96.2 lbs Muscle Mass (lbs): 114 lbs Total Body Water (lbs): 77.8 lbs Visceral Fat Rating : 13   Other Clinical Data Fasting: yes Labs: yes Today's Visit #: 8 Starting Date: 03/16/23 Comments: cat 2    Chief Complaint: OBESITY    History of Present Illness Jodi Bates is a 58 year old female who presents for a follow-up on her obesity treatment plan.  She is adhering to a category two eating plan 95% of the time and has lost three pounds over the past three weeks. She exercises for 30 minutes, three to four times a week. Hunger levels are manageable except on fasting days, and she is currently fasting for blood work. Her last A1c was 5.9, and she is not on any medications that deplete B12, which was normal in previous tests.  She is diligent with meal planning and does not skip meals, describing herself as a 'natural planner' who does not feel bored with her meals. She avoids beef and pork, preferring chicken, [place], or shrimp, and buys groceries weekly, deciding daily what to eat.  She is generally well-hydrated but notes that on busy workdays, she may only consume one bottle of water instead of two and a half or three. Her work schedule can impact her hydration and bathroom breaks.  She mentions consuming a small amount of peanut chips from a mixed snack bag, which she enjoyed but decided to limit.      PHYSICAL EXAM:  Blood pressure 117/77, pulse 61, temperature (!) 97.4 F (36.3 C), height 5' 4 (1.626 m), weight 216 lb (98 kg), last  menstrual period 06/11/2016, SpO2 97%. Body mass index is 37.08 kg/m.  DIAGNOSTIC DATA REVIEWED:  BMET    Component Value Date/Time   NA 142 03/17/2023 1200   K 4.2 03/17/2023 1200   CL 105 03/17/2023 1200   CO2 22 03/17/2023 1200   GLUCOSE 90 03/17/2023 1200   GLUCOSE 123 (H) 02/01/2021 1308   BUN 18 03/17/2023 1200   CREATININE 0.85 03/17/2023 1200   CREATININE 0.73 12/14/2019 1602   CALCIUM  9.1 03/17/2023 1200   GFRNONAA >60 02/01/2021 1308   GFRNONAA 93 12/14/2019 1602   GFRAA 108 12/14/2019 1602   Lab Results  Component Value Date   HGBA1C 5.9 (H) 03/17/2023   HGBA1C 5.9 (H) 12/29/2011   Lab Results  Component Value Date   INSULIN  10.1 03/17/2023   Lab Results  Component Value Date   TSH 2.420 03/17/2023   CBC    Component Value Date/Time   WBC 3.2 (L) 03/17/2023 1200   WBC 5.6 02/01/2021 1308   RBC 4.47 03/17/2023 1200   RBC 4.62 02/01/2021 1308   HGB 13.5 03/17/2023 1200   HGB 12.2 10/16/2008 0914   HCT 41.3 03/17/2023 1200   HCT 36.1 10/16/2008 0914   PLT 195 03/17/2023 1200   MCV 92 03/17/2023 1200   MCV 86.4 10/16/2008 0914   MCH 30.2 03/17/2023 1200   MCH 30.3 02/01/2021 1308   MCHC 32.7 03/17/2023 1200   MCHC 33.3 02/01/2021  1308   RDW 12.2 03/17/2023 1200   RDW 13.9 10/16/2008 0914   Iron Studies    Component Value Date/Time   IRON 98 12/29/2011 1654   TIBC 369 12/29/2011 1654   FERRITIN 11 10/16/2008 0914   IRONPCTSAT 27 12/29/2011 1654   Lipid Panel     Component Value Date/Time   CHOL 126 04/06/2023 1059   TRIG 59 04/06/2023 1059   HDL 48 04/06/2023 1059   CHOLHDL 2.6 04/06/2023 1059   CHOLHDL 3.2 03/25/2015 0905   VLDL 16 03/25/2015 0905   LDLCALC 65 04/06/2023 1059   Hepatic Function Panel     Component Value Date/Time   PROT 6.4 04/06/2023 1059   ALBUMIN 4.2 04/06/2023 1059   AST 18 04/06/2023 1059   ALT 9 04/06/2023 1059   ALKPHOS 78 04/06/2023 1059   BILITOT 0.5 04/06/2023 1059   BILIDIR 0.17 04/06/2023 1059       Component Value Date/Time   TSH 2.420 03/17/2023 1200   Nutritional Lab Results  Component Value Date   VD25OH 25.5 (L) 03/17/2023   VD25OH 42 03/25/2015   VD25OH 46 01/11/2014     Assessment and Plan Assessment & Plan Obesity She adheres to a category two eating plan 95% of the time and exercises for 30 minutes, 3-4 times weekly, resulting in a 3-pound weight loss over the past three weeks. She effectively plans meals, focusing on chicken, [place], and shrimp, and avoids beef and pork. She reports no significant hunger or boredom with meals. - Continue category two eating plan - Continue exercise regimen - Encourage continued meal planning and hydration  Prediabetes Her A1c is 5.9. Dietary and lifestyle modifications are in place to lower A1c levels, with effective management of diet and exercise. - Order blood work to check A1c - Continue dietary and lifestyle modifications  HTN Stable -Check CMP -Continue to work on diet, exercise and weight loss  Hyperlipidemia Cholesterol levels are monitored through blood work as part of her health management plan, with a focus on maintaining a healthy diet and exercise routine. - Order blood work to check cholesterol levels  Vitamin D  deficiency She is on vitamin D  supplementation, with a recent prescription refill. - Refill vitamin D  prescription - Order blood work to check vitamin D  levels  General Health Maintenance She maintains a healthy lifestyle with meal planning, hydration, and exercise, exploring new protein-rich recipes, including a lemon cheesecake fluff providing 100 calories and 10 grams of protein per serving. - Provide new protein-rich recipes for dietary variety   Follow-up A follow-up appointment is scheduled for September 9th, with plans to schedule another in October. - Confirm follow-up appointment on September 9th - Schedule follow-up appointment in October    She was informed of the importance of  frequent follow up visits to maximize her success with intensive lifestyle modifications for her multiple health conditions.    [name], MD

## 2023-08-11 LAB — CMP14+EGFR
ALT: 11 IU/L (ref 0–32)
AST: 16 IU/L (ref 0–40)
Albumin: 4.3 g/dL (ref 3.8–4.9)
Alkaline Phosphatase: 79 IU/L (ref 44–121)
BUN/Creatinine Ratio: 20 (ref 9–23)
BUN: 17 mg/dL (ref 6–24)
Bilirubin Total: 0.5 mg/dL (ref 0.0–1.2)
CO2: 21 mmol/L (ref 20–29)
Calcium: 9.1 mg/dL (ref 8.7–10.2)
Chloride: 107 mmol/L — ABNORMAL HIGH (ref 96–106)
Creatinine, Ser: 0.83 mg/dL (ref 0.57–1.00)
Globulin, Total: 2.1 g/dL (ref 1.5–4.5)
Glucose: 101 mg/dL — ABNORMAL HIGH (ref 70–99)
Potassium: 4 mmol/L (ref 3.5–5.2)
Sodium: 141 mmol/L (ref 134–144)
Total Protein: 6.4 g/dL (ref 6.0–8.5)
eGFR: 82 mL/min/1.73 (ref >59)

## 2023-08-11 LAB — LIPID PANEL WITH LDL/HDL RATIO
Cholesterol, Total: 190 mg/dL (ref 100–199)
HDL: 52 mg/dL (ref >39)
LDL Chol Calc (NIH): 125 mg/dL — ABNORMAL HIGH (ref 0–99)
LDL/HDL Ratio: 2.4 ratio (ref 0.0–3.2)
Triglycerides: 72 mg/dL (ref 0–149)
VLDL Cholesterol Cal: 13 mg/dL (ref 5–40)

## 2023-08-11 LAB — HEMOGLOBIN A1C
Est. average glucose Bld gHb Est-mCnc: 108 mg/dL
Hgb A1c MFr Bld: 5.4 % (ref 4.8–5.6)

## 2023-08-11 LAB — VITAMIN D 25 HYDROXY (VIT D DEFICIENCY, FRACTURES): Vit D, 25-Hydroxy: 65.3 ng/mL (ref 30.0–100.0)

## 2023-08-11 LAB — INSULIN, RANDOM: INSULIN: 8.2 u[IU]/mL (ref 2.6–24.9)

## 2023-08-12 ENCOUNTER — Other Ambulatory Visit (HOSPITAL_COMMUNITY): Payer: Self-pay

## 2023-08-20 ENCOUNTER — Other Ambulatory Visit (HOSPITAL_COMMUNITY): Payer: Self-pay

## 2023-09-07 ENCOUNTER — Other Ambulatory Visit (HOSPITAL_COMMUNITY): Payer: Self-pay

## 2023-09-07 ENCOUNTER — Encounter (INDEPENDENT_AMBULATORY_CARE_PROVIDER_SITE_OTHER): Payer: Self-pay | Admitting: Family Medicine

## 2023-09-07 ENCOUNTER — Ambulatory Visit (INDEPENDENT_AMBULATORY_CARE_PROVIDER_SITE_OTHER): Admitting: Family Medicine

## 2023-09-07 VITALS — BP 130/84 | HR 72 | Temp 98.3°F | Ht 64.0 in | Wt 220.0 lb

## 2023-09-07 DIAGNOSIS — Z6839 Body mass index (BMI) 39.0-39.9, adult: Secondary | ICD-10-CM

## 2023-09-07 DIAGNOSIS — E785 Hyperlipidemia, unspecified: Secondary | ICD-10-CM

## 2023-09-07 DIAGNOSIS — Z6837 Body mass index (BMI) 37.0-37.9, adult: Secondary | ICD-10-CM

## 2023-09-07 DIAGNOSIS — F4321 Adjustment disorder with depressed mood: Secondary | ICD-10-CM | POA: Insufficient documentation

## 2023-09-07 DIAGNOSIS — I10 Essential (primary) hypertension: Secondary | ICD-10-CM

## 2023-09-07 DIAGNOSIS — R7303 Prediabetes: Secondary | ICD-10-CM

## 2023-09-07 DIAGNOSIS — Z789 Other specified health status: Secondary | ICD-10-CM | POA: Insufficient documentation

## 2023-09-07 DIAGNOSIS — E66812 Obesity, class 2: Secondary | ICD-10-CM | POA: Insufficient documentation

## 2023-09-07 DIAGNOSIS — E669 Obesity, unspecified: Secondary | ICD-10-CM | POA: Diagnosis not present

## 2023-09-07 DIAGNOSIS — E7849 Other hyperlipidemia: Secondary | ICD-10-CM

## 2023-09-07 DIAGNOSIS — E559 Vitamin D deficiency, unspecified: Secondary | ICD-10-CM | POA: Diagnosis not present

## 2023-09-07 MED ORDER — VITAMIN D (ERGOCALCIFEROL) 1.25 MG (50000 UNIT) PO CAPS
50000.0000 [IU] | ORAL_CAPSULE | ORAL | 0 refills | Status: DC
  Filled 2023-09-07: qty 4, 28d supply, fill #0

## 2023-09-07 NOTE — Progress Notes (Signed)
 Office: [phone]  /  Fax: [phone]  WEIGHT SUMMARY AND BIOMETRICS  Anthropometric Measurements Height: 5' 4 (1.626 m) Weight: 220 lb (99.8 kg) BMI (Calculated): 37.74 Weight at Last Visit: 216 lb Weight Lost Since Last Visit: 0 lb Weight Gained Since Last Visit: 4 lb Starting Weight: 229 lb Total Weight Loss (lbs): 9 lb (4.082 kg) Peak Weight: 248 lb   Body Composition  Body Fat %: 45.1 % Fat Mass (lbs): 99.4 lbs Muscle Mass (lbs): 114.8 lbs Total Body Water (lbs): 80.8 lbs Visceral Fat Rating : 13   Other Clinical Data Fasting: yes Labs: no Today's Visit #: 9 Starting Date: 03/16/23    Chief Complaint: OBESITY    History of Present Illness Jodi Bates is a 58 year old female with obesity and hypertension who presents for obesity treatment plan assessment and progress evaluation.  She is adhering to a category two eating plan 90% of the time and engages in physical activity by walking for 30 minutes, four days a week. Despite these efforts, she has experienced a weight gain of four pounds over the past month. She attributes this weight gain to stress, particularly due to recent family stressors and a vacation. She reports no issues with hunger or cravings.  She is currently being treated for vitamin D  deficiency with prescription vitamin D , which has improved her levels from 25 to 65. She requests a refill of her prescription.  Her hypertension is managed with amlodipine  10 mg, losartan  50 mg, and Aldactone  25 mg. She is focusing on diet, exercise, and weight loss to aid in blood pressure management.  She is experiencing significant stress due to multiple family issues, including her mother-in-law's hospitalization for E. coli and chronic kidney disease, the anniversary of her mother's birthday, and her son's car breakdown. Additionally, her daughter's recent breakup has impacted her emotional well-being. She is on Lexapro  for emotional support and  occasionally uses Xanax  to manage acute stress. She feels overwhelmed and acknowledges the need to prioritize self-care.      PHYSICAL EXAM:  Blood pressure 130/84, pulse 72, temperature 98.3 F (36.8 C), height 5' 4 (1.626 m), weight 220 lb (99.8 kg), last menstrual period 06/11/2016, SpO2 97%. Body mass index is 37.76 kg/m.  DIAGNOSTIC DATA REVIEWED:  BMET    Component Value Date/Time   NA 141 08/10/2023 1036   K 4.0 08/10/2023 1036   CL 107 (H) 08/10/2023 1036   CO2 21 08/10/2023 1036   GLUCOSE 101 (H) 08/10/2023 1036   GLUCOSE 123 (H) 02/01/2021 1308   BUN 17 08/10/2023 1036   CREATININE 0.83 08/10/2023 1036   CREATININE 0.73 12/14/2019 1602   CALCIUM  9.1 08/10/2023 1036   GFRNONAA >60 02/01/2021 1308   GFRNONAA 93 12/14/2019 1602   GFRAA 108 12/14/2019 1602   Lab Results  Component Value Date   HGBA1C 5.4 08/10/2023   HGBA1C 5.9 (H) 12/29/2011   Lab Results  Component Value Date   INSULIN  8.2 08/10/2023   INSULIN  10.1 03/17/2023   Lab Results  Component Value Date   TSH 2.420 03/17/2023   CBC    Component Value Date/Time   WBC 3.2 (L) 03/17/2023 1200   WBC 5.6 02/01/2021 1308   RBC 4.47 03/17/2023 1200   RBC 4.62 02/01/2021 1308   HGB 13.5 03/17/2023 1200   HGB 12.2 10/16/2008 0914   HCT 41.3 03/17/2023 1200   HCT 36.1 10/16/2008 0914   PLT 195 03/17/2023 1200   MCV 92 03/17/2023 1200  MCV 86.4 10/16/2008 0914   MCH 30.2 03/17/2023 1200   MCH 30.3 02/01/2021 1308   MCHC 32.7 03/17/2023 1200   MCHC 33.3 02/01/2021 1308   RDW 12.2 03/17/2023 1200   RDW 13.9 10/16/2008 0914   Iron Studies    Component Value Date/Time   IRON 98 12/29/2011 1654   TIBC 369 12/29/2011 1654   FERRITIN 11 10/16/2008 0914   IRONPCTSAT 27 12/29/2011 1654   Lipid Panel     Component Value Date/Time   CHOL 190 08/10/2023 1036   TRIG 72 08/10/2023 1036   HDL 52 08/10/2023 1036   CHOLHDL 2.6 04/06/2023 1059   CHOLHDL 3.2 03/25/2015 0905   VLDL 16 03/25/2015  0905   LDLCALC 125 (H) 08/10/2023 1036   Hepatic Function Panel     Component Value Date/Time   PROT 6.4 08/10/2023 1036   ALBUMIN 4.3 08/10/2023 1036   AST 16 08/10/2023 1036   ALT 11 08/10/2023 1036   ALKPHOS 79 08/10/2023 1036   BILITOT 0.5 08/10/2023 1036   BILIDIR 0.17 04/06/2023 1059      Component Value Date/Time   TSH 2.420 03/17/2023 1200   Nutritional Lab Results  Component Value Date   VD25OH 65.3 08/10/2023   VD25OH 25.5 (L) 03/17/2023   VD25OH 42 03/25/2015     Assessment and Plan Assessment & Plan Obesity Obesity management is challenged by stress and water retention from recent travel, resulting in a four-pound weight gain. Stress and family responsibilities are impacting weight management efforts. - Encourage adherence to category two eating plan. - Advise increased hydration to reduce sodium-related water retention. - Continue exercise regimen.  Vitamin D deficiency Vitamin D deficiency has improved with prescription vitamin D, with current levels at 65, within the target range of 50-60. - Refill prescription vitamin D.  Hypertension Hypertension is controlled with amlodipine, losartan, and Aldactone. Current blood pressure is 130/84. Stress and sodium intake from travel may have contributed to slight elevation. - Continue current antihypertensive medications. - Advise increased hydration to manage sodium intake.  Prediabetes, in remission Prediabetes is in remission with lifestyle modifications. A1c improved from 5.9 to 5.4. Glucose is slightly elevated at 101, but not concerning. - Continue lifestyle modifications including diet and exercise.  Hyperlipidemia, statin intolerant Hyperlipidemia with recent increase in LDL due to discontinuation of statins secondary to muscle pain. Awaiting further evaluation and blood work in September to determine next steps. Potential alternative medications for statin intolerance were discussed. - Await results of  September blood work for further management.  Grief and stress related to family loss and responsibilities Experiencing significant stress and grief related to family responsibilities and loss, impacting weight management and overall well-being. Currently on Lexapro for emotional support. - Encourage self-care and prioritization of personal needs. - Consider therapeutic activities such as visiting calming environments (e.g., beach or lake).     I have personally spent 40 minutes total time today in preparation, patient care, and documentation for this visit, including the following: review of clinical lab tests; review of medical history, review of emotional eating behaviors and nutritional counseling   She was informed of the importance of frequent follow up visits to maximize her success with intensive lifestyle modifications for her multiple health conditions.    [name], MD

## 2023-09-09 ENCOUNTER — Other Ambulatory Visit (HOSPITAL_COMMUNITY): Payer: Self-pay

## 2023-09-15 ENCOUNTER — Other Ambulatory Visit (HOSPITAL_COMMUNITY): Payer: Self-pay

## 2023-10-03 ENCOUNTER — Other Ambulatory Visit (HOSPITAL_COMMUNITY): Payer: Self-pay

## 2023-10-05 ENCOUNTER — Encounter (INDEPENDENT_AMBULATORY_CARE_PROVIDER_SITE_OTHER): Payer: Self-pay | Admitting: Family Medicine

## 2023-10-05 ENCOUNTER — Other Ambulatory Visit (HOSPITAL_COMMUNITY): Payer: Self-pay

## 2023-10-05 ENCOUNTER — Ambulatory Visit (INDEPENDENT_AMBULATORY_CARE_PROVIDER_SITE_OTHER): Admitting: Family Medicine

## 2023-10-05 VITALS — BP 117/78 | HR 79 | Temp 98.2°F | Ht 64.0 in | Wt 216.0 lb

## 2023-10-05 DIAGNOSIS — Z6837 Body mass index (BMI) 37.0-37.9, adult: Secondary | ICD-10-CM | POA: Diagnosis not present

## 2023-10-05 DIAGNOSIS — J45990 Exercise induced bronchospasm: Secondary | ICD-10-CM

## 2023-10-05 DIAGNOSIS — E559 Vitamin D deficiency, unspecified: Secondary | ICD-10-CM

## 2023-10-05 DIAGNOSIS — E66812 Obesity, class 2: Secondary | ICD-10-CM

## 2023-10-05 DIAGNOSIS — E669 Obesity, unspecified: Secondary | ICD-10-CM | POA: Diagnosis not present

## 2023-10-05 MED ORDER — VITAMIN D (ERGOCALCIFEROL) 1.25 MG (50000 UNIT) PO CAPS
50000.0000 [IU] | ORAL_CAPSULE | ORAL | 0 refills | Status: DC
  Filled 2023-10-05: qty 4, 28d supply, fill #0

## 2023-10-05 NOTE — Progress Notes (Signed)
 Office: [phone]  /  Fax: [phone]  WEIGHT SUMMARY AND BIOMETRICS  Anthropometric Measurements Height: 5' 4 (1.626 m) Weight: 216 lb (98 kg) BMI (Calculated): 37.06 Weight at Last Visit: 220 lb Weight Lost Since Last Visit: 4 lb Weight Gained Since Last Visit: 0 Starting Weight: 229 lb Total Weight Loss (lbs): 13 lb (5.897 kg) Peak Weight: 248 lb   Body Composition  Body Fat %: 44.9 % Fat Mass (lbs): 97 lbs Muscle Mass (lbs): 113 lbs Total Body Water (lbs): 79.2 lbs Visceral Fat Rating : 13   Other Clinical Data Fasting: no Labs: no Today's Visit #: 10 Starting Date: 03/16/23 Comments: [email or web address]    Chief Complaint: OBESITY   History of Present Illness Jodi Bates is a 58 year old female who presents for obesity treatment and progress assessment.  She is adhering to a category two eating plan with 95% compliance, focusing on increasing her intake of fruits, vegetables, and protein, and avoiding skipping meals. However, she may not be hydrating adequately and struggles to consistently achieve 7-9 hours of sleep per night, which could impact her obesity management.  She engages in physical activity by walking for 30 minutes, three days a week. Due to her asthma, she has transitioned to going to the gym at least once a week to avoid cold weather, which exacerbates her asthma. She experiences exercise-induced asthma if she overexerts herself and uses her inhaler as needed, typically carrying it with her at all times.  She is being treated for vitamin D  deficiency with a prescription of 50,000 IU of vitamin D  weekly. Her most recent vitamin D  level in July was at goal at 65.3, and she is maintaining this level.  Her hunger levels are appropriate, feeling hungry before meals as expected. She is working on improving her sleep and hydration, acknowledging challenges due to her work environment, which restricts water bottle access.      PHYSICAL EXAM:  Blood  pressure 117/78, pulse 79, temperature 98.2 F (36.8 C), height 5' 4 (1.626 m), weight 216 lb (98 kg), last menstrual period 06/11/2016, SpO2 98%. Body mass index is 37.08 kg/m.  DIAGNOSTIC DATA REVIEWED:  BMET    Component Value Date/Time   NA 141 08/10/2023 1036   K 4.0 08/10/2023 1036   CL 107 (H) 08/10/2023 1036   CO2 21 08/10/2023 1036   GLUCOSE 101 (H) 08/10/2023 1036   GLUCOSE 123 (H) 02/01/2021 1308   BUN 17 08/10/2023 1036   CREATININE 0.83 08/10/2023 1036   CREATININE 0.73 12/14/2019 1602   CALCIUM  9.1 08/10/2023 1036   GFRNONAA >60 02/01/2021 1308   GFRNONAA 93 12/14/2019 1602   GFRAA 108 12/14/2019 1602   Lab Results  Component Value Date   HGBA1C 5.4 08/10/2023   HGBA1C 5.9 (H) 12/29/2011   Lab Results  Component Value Date   INSULIN  8.2 08/10/2023   INSULIN  10.1 03/17/2023   Lab Results  Component Value Date   TSH 2.420 03/17/2023   CBC    Component Value Date/Time   WBC 3.2 (L) 03/17/2023 1200   WBC 5.6 02/01/2021 1308   RBC 4.47 03/17/2023 1200   RBC 4.62 02/01/2021 1308   HGB 13.5 03/17/2023 1200   HGB 12.2 10/16/2008 0914   HCT 41.3 03/17/2023 1200   HCT 36.1 10/16/2008 0914   PLT 195 03/17/2023 1200   MCV 92 03/17/2023 1200   MCV 86.4 10/16/2008 0914   MCH 30.2 03/17/2023 1200   MCH 30.3 02/01/2021 1308  MCHC 32.7 03/17/2023 1200   MCHC 33.3 02/01/2021 1308   RDW 12.2 03/17/2023 1200   RDW 13.9 10/16/2008 0914   Iron Studies    Component Value Date/Time   IRON 98 12/29/2011 1654   TIBC 369 12/29/2011 1654   FERRITIN 11 10/16/2008 0914   IRONPCTSAT 27 12/29/2011 1654   Lipid Panel     Component Value Date/Time   CHOL 190 08/10/2023 1036   TRIG 72 08/10/2023 1036   HDL 52 08/10/2023 1036   CHOLHDL 2.6 04/06/2023 1059   CHOLHDL 3.2 03/25/2015 0905   VLDL 16 03/25/2015 0905   LDLCALC 125 (H) 08/10/2023 1036   Hepatic Function Panel     Component Value Date/Time   PROT 6.4 08/10/2023 1036   ALBUMIN 4.3 08/10/2023 1036    AST 16 08/10/2023 1036   ALT 11 08/10/2023 1036   ALKPHOS 79 08/10/2023 1036   BILITOT 0.5 08/10/2023 1036   BILIDIR 0.17 04/06/2023 1059      Component Value Date/Time   TSH 2.420 03/17/2023 1200   Nutritional Lab Results  Component Value Date   VD25OH 65.3 08/10/2023   VD25OH 25.5 (L) 03/17/2023   VD25OH 42 03/25/2015     Assessment and Plan Assessment & Plan Obesity Obesity management is ongoing with a category two eating plan, which is being followed 95% of the time. Exercise includes walking for 30 minutes, three days a week. Efforts are being made to increase fruit and vegetable intake and meet protein goals. There is a potential issue with inadequate hydration and insufficient sleep, which can affect weight management. Hunger levels are appropriate, indicating good adherence to meal timing. - Continue category two eating plan. - Encourage walking for 30 minutes, three days a week. - Increase fruit and vegetable intake and meet protein goals. - Improve hydration and aim for 7-9 hours of sleep per night. - Provide soup recipes to support dietary goals.  Vitamin D  deficiency Vitamin D  deficiency is being managed with prescription vitamin D  50,000 IU weekly. The most recent vitamin D  level in July was at goal (65.3 ng/mL), and the current plan is to maintain this level. - Refill prescription for vitamin D  50,000 IU weekly.  Asthma, mild exercise induced and mild chronic Asthma is affected by cold weather and ragweed pollen, which can exacerbate symptoms. Exercise-induced asthma is managed by using an inhaler before exercise. The goal is to prevent exacerbations and avoid the need for steroids. - Use albuterol  inhaler 30 minutes before exercise. - Increase gym visits to avoid cold weather and pollen exposure.   Jodi Bates was informed of the importance of frequent follow up visits to maximize her success with intensive lifestyle modifications for her obesity and obesity related  health conditions as recommended by USPSTF and CMS guidelines   [name], MD

## 2023-10-16 ENCOUNTER — Other Ambulatory Visit (HOSPITAL_COMMUNITY): Payer: Self-pay

## 2023-10-16 MED ORDER — FLUZONE 0.5 ML IM SUSY
0.5000 mL | PREFILLED_SYRINGE | Freq: Once | INTRAMUSCULAR | 0 refills | Status: AC
  Filled 2023-10-16: qty 0.5, 1d supply, fill #0

## 2023-10-19 DIAGNOSIS — Z124 Encounter for screening for malignant neoplasm of cervix: Secondary | ICD-10-CM | POA: Diagnosis not present

## 2023-10-19 DIAGNOSIS — Z6838 Body mass index (BMI) 38.0-38.9, adult: Secondary | ICD-10-CM | POA: Diagnosis not present

## 2023-10-19 DIAGNOSIS — Z1151 Encounter for screening for human papillomavirus (HPV): Secondary | ICD-10-CM | POA: Diagnosis not present

## 2023-10-19 DIAGNOSIS — Z01419 Encounter for gynecological examination (general) (routine) without abnormal findings: Secondary | ICD-10-CM | POA: Diagnosis not present

## 2023-10-20 ENCOUNTER — Other Ambulatory Visit (HOSPITAL_COMMUNITY): Payer: Self-pay | Admitting: Obstetrics and Gynecology

## 2023-10-20 DIAGNOSIS — Z800 Family history of malignant neoplasm of digestive organs: Secondary | ICD-10-CM

## 2023-10-26 DIAGNOSIS — E782 Mixed hyperlipidemia: Secondary | ICD-10-CM | POA: Diagnosis not present

## 2023-10-26 LAB — COMPREHENSIVE METABOLIC PANEL WITH GFR
ALT: 12 IU/L (ref 0–32)
AST: 16 IU/L (ref 0–40)
Albumin: 4.1 g/dL (ref 3.8–4.9)
Alkaline Phosphatase: 83 IU/L (ref 49–135)
BUN/Creatinine Ratio: 16 (ref 9–23)
BUN: 13 mg/dL (ref 6–24)
Bilirubin Total: 0.3 mg/dL (ref 0.0–1.2)
CO2: 23 mmol/L (ref 20–29)
Calcium: 8.9 mg/dL (ref 8.7–10.2)
Chloride: 107 mmol/L — ABNORMAL HIGH (ref 96–106)
Creatinine, Ser: 0.81 mg/dL (ref 0.57–1.00)
Globulin, Total: 2.3 g/dL (ref 1.5–4.5)
Glucose: 103 mg/dL — ABNORMAL HIGH (ref 70–99)
Potassium: 4.4 mmol/L (ref 3.5–5.2)
Sodium: 142 mmol/L (ref 134–144)
Total Protein: 6.4 g/dL (ref 6.0–8.5)
eGFR: 85 mL/min/1.73 (ref >59)

## 2023-10-26 LAB — LIPID PANEL
Chol/HDL Ratio: 3.4 ratio (ref 0.0–4.4)
Cholesterol, Total: 168 mg/dL (ref 100–199)
HDL: 50 mg/dL (ref >39)
LDL Chol Calc (NIH): 108 mg/dL — ABNORMAL HIGH (ref 0–99)
Triglycerides: 50 mg/dL (ref 0–149)
VLDL Cholesterol Cal: 10 mg/dL (ref 5–40)

## 2023-10-28 ENCOUNTER — Ambulatory Visit (HOSPITAL_BASED_OUTPATIENT_CLINIC_OR_DEPARTMENT_OTHER): Payer: Self-pay | Admitting: Family

## 2023-10-28 ENCOUNTER — Other Ambulatory Visit (HOSPITAL_COMMUNITY): Payer: Self-pay

## 2023-10-28 ENCOUNTER — Other Ambulatory Visit: Payer: Self-pay

## 2023-10-28 DIAGNOSIS — Z789 Other specified health status: Secondary | ICD-10-CM

## 2023-10-28 DIAGNOSIS — R7401 Elevation of levels of liver transaminase levels: Secondary | ICD-10-CM

## 2023-10-28 DIAGNOSIS — E782 Mixed hyperlipidemia: Secondary | ICD-10-CM

## 2023-10-28 DIAGNOSIS — E7849 Other hyperlipidemia: Secondary | ICD-10-CM

## 2023-10-28 DIAGNOSIS — Z79899 Other long term (current) drug therapy: Secondary | ICD-10-CM

## 2023-10-28 DIAGNOSIS — I700 Atherosclerosis of aorta: Secondary | ICD-10-CM

## 2023-10-28 MED ORDER — NEXLIZET 180-10 MG PO TABS
1.0000 | ORAL_TABLET | Freq: Every day | ORAL | 6 refills | Status: AC
  Filled 2023-10-28: qty 30, 30d supply, fill #0
  Filled 2023-11-30: qty 30, 30d supply, fill #1
  Filled 2023-12-30: qty 30, 30d supply, fill #2
  Filled 2024-02-07: qty 30, 30d supply, fill #3

## 2023-10-28 NOTE — Telephone Encounter (Signed)
-----   Message from Reche GORMAN Finder sent at 10/28/2023  1:36 PM EDT ----- Normal kidneys, liver, electrolytes.  Cholesterol elevated though improved from previous. LDL (bad cholesterol) not at goal <70.  Recommend initiation of Nexlizet one tablet daily with repeat FLP/ALT in 3 months.  ----- Message ----- From: Interface, [facility] Lab Results In Sent: 10/26/2023  11:36 PM EDT To: Reche GORMAN Finder, NP

## 2023-10-28 NOTE — Telephone Encounter (Signed)
 The patient has been notified of the result and verbalized understanding.  All questions (if any) were answered.  Pt aware we recommend initiation of Nexlizet one tablet daily and we will repeat her FLP/ALT in 3 months.   Confirmed the pharmacy of choice with the pt.   Pt aware to come back into the lab in 3 months for repeat lipids and ALT.  Pt is aware that would be around 01/28/2024 or a little after.  Pt aware to come fasting to this lab appointment.  Pt verbalized understanding and agrees with this plan.

## 2023-10-29 ENCOUNTER — Other Ambulatory Visit (HOSPITAL_COMMUNITY): Payer: Self-pay

## 2023-11-01 ENCOUNTER — Other Ambulatory Visit (HOSPITAL_COMMUNITY): Payer: Self-pay | Admitting: Obstetrics and Gynecology

## 2023-11-01 DIAGNOSIS — Z800 Family history of malignant neoplasm of digestive organs: Secondary | ICD-10-CM

## 2023-11-02 ENCOUNTER — Ambulatory Visit (INDEPENDENT_AMBULATORY_CARE_PROVIDER_SITE_OTHER): Admitting: Family Medicine

## 2023-11-02 ENCOUNTER — Other Ambulatory Visit (HOSPITAL_COMMUNITY): Payer: Self-pay

## 2023-11-02 ENCOUNTER — Encounter (INDEPENDENT_AMBULATORY_CARE_PROVIDER_SITE_OTHER): Payer: Self-pay | Admitting: Family Medicine

## 2023-11-02 ENCOUNTER — Ambulatory Visit (HOSPITAL_COMMUNITY)
Admission: RE | Admit: 2023-11-02 | Discharge: 2023-11-02 | Disposition: A | Discharge status: Home / Self Care | Source: Ambulatory Visit | Attending: Obstetrics and Gynecology | Admitting: Obstetrics and Gynecology

## 2023-11-02 VITALS — BP 103/70 | HR 78 | Temp 97.8°F | Ht 64.0 in | Wt 214.0 lb

## 2023-11-02 DIAGNOSIS — E559 Vitamin D deficiency, unspecified: Secondary | ICD-10-CM

## 2023-11-02 DIAGNOSIS — E785 Hyperlipidemia, unspecified: Secondary | ICD-10-CM | POA: Diagnosis not present

## 2023-11-02 DIAGNOSIS — E66812 Obesity, class 2: Secondary | ICD-10-CM

## 2023-11-02 DIAGNOSIS — Z6836 Body mass index (BMI) 36.0-36.9, adult: Secondary | ICD-10-CM

## 2023-11-02 DIAGNOSIS — Z1289 Encounter for screening for malignant neoplasm of other sites: Secondary | ICD-10-CM | POA: Insufficient documentation

## 2023-11-02 DIAGNOSIS — Z800 Family history of malignant neoplasm of digestive organs: Secondary | ICD-10-CM | POA: Diagnosis not present

## 2023-11-02 DIAGNOSIS — Z6837 Body mass index (BMI) 37.0-37.9, adult: Secondary | ICD-10-CM

## 2023-11-02 DIAGNOSIS — Z6839 Body mass index (BMI) 39.0-39.9, adult: Secondary | ICD-10-CM

## 2023-11-02 MED ORDER — GADOBUTROL 1 MMOL/ML IV SOLN
10.0000 mL | Freq: Once | INTRAVENOUS | Status: AC | PRN
  Administered 2023-11-02: 10 mL via INTRAVENOUS

## 2023-11-02 MED ORDER — VITAMIN D (ERGOCALCIFEROL) 1.25 MG (50000 UNIT) PO CAPS
50000.0000 [IU] | ORAL_CAPSULE | ORAL | 0 refills | Status: DC
  Filled 2023-11-02: qty 4, 28d supply, fill #0

## 2023-11-02 NOTE — Progress Notes (Signed)
 Office: [phone]  /  Fax: [phone]  WEIGHT SUMMARY AND BIOMETRICS  Anthropometric Measurements Height: 5' 4 (1.626 m) Weight: 214 lb (97.1 kg) BMI (Calculated): 36.72 Weight at Last Visit: 216 lb Weight Lost Since Last Visit: 2 lb Weight Gained Since Last Visit: 0 Starting Weight: 229 lb Total Weight Loss (lbs): 15 lb (6.804 kg) Peak Weight: 248 lb   Body Composition  Body Fat %: 44.7 % Fat Mass (lbs): 95.6 lbs Muscle Mass (lbs): 112.4 lbs Total Body Water (lbs): 78.2 lbs Visceral Fat Rating : 13   Other Clinical Data Fasting: no Labs: no Today's Visit #: 11 Starting Date: 03/16/23    Chief Complaint: OBESITY     History of Present Illness Jodi Bates is a 58 year old female who presents for treatment assessment and progress evaluation.  She adheres to the category two eating plan 95% of the time, focusing on consuming adequate fruits, vegetables, and meeting protein goals. However, she occasionally skips meals, does not drink enough water, and struggles to consistently achieve seven or more hours of sleep.  She engages in physical activity by walking for exercise for thirty minutes, three times a week. Over the past month, she has lost two pounds.  Stress primarily from home affects her eating habits, leading to missed meals. She sometimes forgets to eat due to stress but is making efforts to be purposeful about eating, such as opting for healthier choices like grilled chicken, salad, and broccoli when she does eat.  Her sleep is somewhat affected by stress, but she notes that some of her medications make her drowsy, which helps her sleep. She currently averages about six hours of sleep per night, an improvement from five hours previously.  She recently started taking Nexlizet for cholesterol management, which she takes at night. Initially, she experienced discomfort behind her thigh and heel, but these symptoms improved by the third day of  use.      PHYSICAL EXAM:  Blood pressure 103/70, pulse 78, temperature 97.8 F (36.6 C), height 5' 4 (1.626 m), weight 214 lb (97.1 kg), last menstrual period 06/11/2016, SpO2 98%. Body mass index is 36.73 kg/m.  DIAGNOSTIC DATA REVIEWED:  BMET    Component Value Date/Time   NA 142 10/26/2023 1209   K 4.4 10/26/2023 1209   CL 107 (H) 10/26/2023 1209   CO2 23 10/26/2023 1209   GLUCOSE 103 (H) 10/26/2023 1209   GLUCOSE 123 (H) 02/01/2021 1308   BUN 13 10/26/2023 1209   CREATININE 0.81 10/26/2023 1209   CREATININE 0.73 12/14/2019 1602   CALCIUM  8.9 10/26/2023 1209   GFRNONAA >60 02/01/2021 1308   GFRNONAA 93 12/14/2019 1602   GFRAA 108 12/14/2019 1602   Lab Results  Component Value Date   HGBA1C 5.4 08/10/2023   HGBA1C 5.9 (H) 12/29/2011   Lab Results  Component Value Date   INSULIN  8.2 08/10/2023   INSULIN  10.1 03/17/2023   Lab Results  Component Value Date   TSH 2.420 03/17/2023   CBC    Component Value Date/Time   WBC 3.2 (L) 03/17/2023 1200   WBC 5.6 02/01/2021 1308   RBC 4.47 03/17/2023 1200   RBC 4.62 02/01/2021 1308   HGB 13.5 03/17/2023 1200   HGB 12.2 10/16/2008 0914   HCT 41.3 03/17/2023 1200   HCT 36.1 10/16/2008 0914   PLT 195 03/17/2023 1200   MCV 92 03/17/2023 1200   MCV 86.4 10/16/2008 0914   MCH 30.2 03/17/2023 1200   MCH 30.3  02/01/2021 1308   MCHC 32.7 03/17/2023 1200   MCHC 33.3 02/01/2021 1308   RDW 12.2 03/17/2023 1200   RDW 13.9 10/16/2008 0914   Iron Studies    Component Value Date/Time   IRON 98 12/29/2011 1654   TIBC 369 12/29/2011 1654   FERRITIN 11 10/16/2008 0914   IRONPCTSAT 27 12/29/2011 1654   Lipid Panel     Component Value Date/Time   CHOL 168 10/26/2023 1209   TRIG 50 10/26/2023 1209   HDL 50 10/26/2023 1209   CHOLHDL 3.4 10/26/2023 1209   CHOLHDL 3.2 03/25/2015 0905   VLDL 16 03/25/2015 0905   LDLCALC 108 (H) 10/26/2023 1209   Hepatic Function Panel     Component Value Date/Time   PROT 6.4  10/26/2023 1209   ALBUMIN 4.1 10/26/2023 1209   AST 16 10/26/2023 1209   ALT 12 10/26/2023 1209   ALKPHOS 83 10/26/2023 1209   BILITOT 0.3 10/26/2023 1209   BILIDIR 0.17 04/06/2023 1059      Component Value Date/Time   TSH 2.420 03/17/2023 1200   Nutritional Lab Results  Component Value Date   VD25OH 65.3 08/10/2023   VD25OH 25.5 (L) 03/17/2023   VD25OH 42 03/25/2015     Assessment and Plan Assessment & Plan Class 2 Obesity Class 2 obesity with recent weight loss of 2 pounds over the last month. She adheres to the category two eating plan 95% of the time, focusing on adequate fruit, vegetable, and protein intake. Challenges include meal skipping, inadequate hydration, and insufficient sleep due to stress. Physical activity includes walking 30 minutes three times a week. Stress from home environment affects meal planning and sleep. - Continue category two eating plan - Encourage consistent meal planning to avoid skipping meals - Aim for 7-8 hours of sleep per night - Continue walking 30 minutes three times a week - Provide additional [employer] recipes to maintain dietary interest while still meeting her nutritional criteria  Vitamin D  Deficiency Vitamin D  deficiency requiring ongoing supplementation. - Refill vitamin D  prescription  Hyperlipidemia Hyperlipidemia managed with Nexlizet, a combination of Zetia and another agent, effective for triglycerides but less potent on LDL cholesterol compared to statins. Initial discomfort in thigh and heel resolved by the third day. No significant adverse reactions reported. - Continue Nexlizet as prescribed by cardiologist - Monitor for any adverse reactions and report if she occurs - Continue diet, exercise and weight loss as discussed today as an important part of the treatment plan     Jodi Bates was informed of the importance of frequent follow up visits to maximize her success with intensive lifestyle modifications for her obesity and  obesity related health conditions as recommended by USPSTF and CMS guidelines   [name], MD

## 2023-11-09 DIAGNOSIS — Z113 Encounter for screening for infections with a predominantly sexual mode of transmission: Secondary | ICD-10-CM | POA: Diagnosis not present

## 2023-11-09 DIAGNOSIS — N841 Polyp of cervix uteri: Secondary | ICD-10-CM | POA: Diagnosis not present

## 2023-11-30 ENCOUNTER — Ambulatory Visit (INDEPENDENT_AMBULATORY_CARE_PROVIDER_SITE_OTHER): Payer: Self-pay | Admitting: Family Medicine

## 2023-11-30 ENCOUNTER — Encounter (INDEPENDENT_AMBULATORY_CARE_PROVIDER_SITE_OTHER): Payer: Self-pay | Admitting: Family Medicine

## 2023-11-30 ENCOUNTER — Other Ambulatory Visit (HOSPITAL_COMMUNITY): Payer: Self-pay

## 2023-11-30 VITALS — BP 115/79 | HR 73 | Temp 97.6°F | Ht 64.0 in | Wt 214.0 lb

## 2023-11-30 DIAGNOSIS — F439 Reaction to severe stress, unspecified: Secondary | ICD-10-CM

## 2023-11-30 DIAGNOSIS — R7303 Prediabetes: Secondary | ICD-10-CM | POA: Diagnosis not present

## 2023-11-30 DIAGNOSIS — Z6836 Body mass index (BMI) 36.0-36.9, adult: Secondary | ICD-10-CM | POA: Diagnosis not present

## 2023-11-30 DIAGNOSIS — E559 Vitamin D deficiency, unspecified: Secondary | ICD-10-CM

## 2023-11-30 DIAGNOSIS — E669 Obesity, unspecified: Secondary | ICD-10-CM

## 2023-11-30 MED ORDER — VITAMIN D (ERGOCALCIFEROL) 1.25 MG (50000 UNIT) PO CAPS
50000.0000 [IU] | ORAL_CAPSULE | ORAL | 0 refills | Status: DC
  Filled 2023-11-30: qty 4, 28d supply, fill #0

## 2023-11-30 NOTE — Progress Notes (Signed)
 Office: [phone]  /  Fax: [phone]  WEIGHT SUMMARY AND BIOMETRICS  Anthropometric Measurements Height: 5' 4 (1.626 m) Weight: 214 lb (97.1 kg) BMI (Calculated): 36.72 Weight at Last Visit: 214 lb Weight Lost Since Last Visit: 0 Weight Gained Since Last Visit: 0 Starting Weight: 229 lb Total Weight Loss (lbs): 15 lb (6.804 kg) Peak Weight: 248 lb   Body Composition  Body Fat %: 45.2 % Fat Mass (lbs): 96.8 lbs Muscle Mass (lbs): 111.2 lbs Total Body Water (lbs): 79.6 lbs Visceral Fat Rating : 13   Other Clinical Data Fasting: no Labs: no Today's Visit #: 12 Starting Date: 03/16/23    Chief Complaint: OBESITY   History of Present Illness Jodi Bates is a 58 year old female with obesity and vitamin D  deficiency who presents for obesity treatment and progress assessment.  She has been following the category two eating plan 90% of the time, focusing on increasing her intake of fruits and vegetables and consuming the recommended amount of protein. She is not skipping meals but acknowledges inadequate hydration and insufficient sleep, typically not achieving seven to nine hours per night. For exercise, she walks three to four days a week for 30 minutes and has maintained her weight over the last month since her last visit.  She is being treated for vitamin D  deficiency with prescription ergocalciferol , 50,000 international units per week, and requests a refill. She also has prediabetes, with her highest hemoglobin A1c recorded at 5.9 in February of this year. She is not on medication for this condition but is working on dietary changes, such as reducing simple carbohydrates, and increasing exercise to manage it.  She reports high stress levels due to personal issues, including marital challenges and family health concerns. These stressors have not significantly disrupted her eating habits, as she is not a comfort eater, but she occasionally forgets to eat. She  has been focusing on self-care, including going to the gym more frequently and engaging in weightlifting using machines.  She is currently taking Lexapro  as needed, using half a pill when she feels it is necessary. She is still living at home, and her husband has been staying at his mother's due to her illness. She has been communicating intermittently. She emphasizes her ability to take ownership of her actions but not for others' behaviors.      PHYSICAL EXAM:  Blood pressure 115/79, pulse 73, temperature 97.6 F (36.4 C), height 5' 4 (1.626 m), weight 214 lb (97.1 kg), last menstrual period 06/11/2016, SpO2 98%. Body mass index is 36.73 kg/m.  DIAGNOSTIC DATA REVIEWED:  BMET    Component Value Date/Time   NA 142 10/26/2023 1209   K 4.4 10/26/2023 1209   CL 107 (H) 10/26/2023 1209   CO2 23 10/26/2023 1209   GLUCOSE 103 (H) 10/26/2023 1209   GLUCOSE 123 (H) 02/01/2021 1308   BUN 13 10/26/2023 1209   CREATININE 0.81 10/26/2023 1209   CREATININE 0.73 12/14/2019 1602   CALCIUM  8.9 10/26/2023 1209   GFRNONAA >60 02/01/2021 1308   GFRNONAA 93 12/14/2019 1602   GFRAA 108 12/14/2019 1602   Lab Results  Component Value Date   HGBA1C 5.4 08/10/2023   HGBA1C 5.9 (H) 12/29/2011   Lab Results  Component Value Date   INSULIN  8.2 08/10/2023   INSULIN  10.1 03/17/2023   Lab Results  Component Value Date   TSH 2.420 03/17/2023   CBC    Component Value Date/Time   WBC 3.2 (L) 03/17/2023 1200  WBC 5.6 02/01/2021 1308   RBC 4.47 03/17/2023 1200   RBC 4.62 02/01/2021 1308   HGB 13.5 03/17/2023 1200   HGB 12.2 10/16/2008 0914   HCT 41.3 03/17/2023 1200   HCT 36.1 10/16/2008 0914   PLT 195 03/17/2023 1200   MCV 92 03/17/2023 1200   MCV 86.4 10/16/2008 0914   MCH 30.2 03/17/2023 1200   MCH 30.3 02/01/2021 1308   MCHC 32.7 03/17/2023 1200   MCHC 33.3 02/01/2021 1308   RDW 12.2 03/17/2023 1200   RDW 13.9 10/16/2008 0914   Iron Studies    Component Value Date/Time   IRON  98 12/29/2011 1654   TIBC 369 12/29/2011 1654   FERRITIN 11 10/16/2008 0914   IRONPCTSAT 27 12/29/2011 1654   Lipid Panel     Component Value Date/Time   CHOL 168 10/26/2023 1209   TRIG 50 10/26/2023 1209   HDL 50 10/26/2023 1209   CHOLHDL 3.4 10/26/2023 1209   CHOLHDL 3.2 03/25/2015 0905   VLDL 16 03/25/2015 0905   LDLCALC 108 (H) 10/26/2023 1209   Hepatic Function Panel     Component Value Date/Time   PROT 6.4 10/26/2023 1209   ALBUMIN 4.1 10/26/2023 1209   AST 16 10/26/2023 1209   ALT 12 10/26/2023 1209   ALKPHOS 83 10/26/2023 1209   BILITOT 0.3 10/26/2023 1209   BILIDIR 0.17 04/06/2023 1059      Component Value Date/Time   TSH 2.420 03/17/2023 1200   Nutritional Lab Results  Component Value Date   VD25OH 65.3 08/10/2023   VD25OH 25.5 (L) 03/17/2023   VD25OH 42 03/25/2015     Assessment and Plan Assessment & Plan Obesity with BMI 36.0-36.9, adult Obesity management is ongoing with adherence to the category two eating plan 90% of the time. She is incorporating more fruits and vegetables, consuming the recommended protein intake, and not skipping meals. However, hydration is inadequate, and sleep is less than the recommended 7-9 hours per night. She engages in walking 3-4 days a week for 30 minutes and has maintained her weight over the last month. Recently, she has been attending the gym more frequently, focusing on machine exercises, and has started weightlifting. - Continue category two eating plan - Encouraged adequate sleep - Continue regular physical activity, including gym workouts  Vitamin D  deficiency Managed with prescription ergocalciferol  50,000 IU per week. She requests a refill. - Refilled ergocalciferol  50,000 IU weekly  Prediabetes Managed through diet, exercise, and weight loss. The highest hemoglobin A1c was 5.9 in February. She is not on medication and is working on reducing simple carbohydrates and increasing exercise. - Continue dietary  modifications to reduce simple carbohydrates - Continue regular exercise regimen  Psychosocial stress related to marital issues Experiencing significant psychosocial stress due to marital issues, including recent discussions about the future of the marriage. Stress has impacted eating habits, though she is not engaging in comfort eating. She has support but may benefit from additional emotional support. She is taking Lexapro  daily and xanax  as needed for stress and anxiety management. - Offered referral for counseling if needed - Continue Lexapro  as needed for stress management    Jalan was counseled on the importance of maintaining healthy lifestyle habits, including balanced nutrition, regular physical activity, and behavioral modifications, while taking antiobesity medication.  Patient verbalized understanding that medication is an adjunct to, not a replacement for, lifestyle changes and that the long-term success and weight maintenance depend on continued adherence to these strategies.   Kimberlin was informed  of the importance of frequent follow up visits to maximize her success with intensive lifestyle modifications for her obesity and obesity related health conditions as recommended by USPSTF and CMS guidelines   [name], MD

## 2023-12-09 ENCOUNTER — Other Ambulatory Visit (HOSPITAL_COMMUNITY): Payer: Self-pay

## 2023-12-21 ENCOUNTER — Other Ambulatory Visit: Payer: Self-pay

## 2023-12-21 ENCOUNTER — Other Ambulatory Visit (HOSPITAL_COMMUNITY): Payer: Self-pay

## 2023-12-21 ENCOUNTER — Encounter: Payer: Self-pay | Admitting: Allergy and Immunology

## 2023-12-21 ENCOUNTER — Ambulatory Visit: Admitting: Allergy and Immunology

## 2023-12-21 VITALS — BP 120/80 | Temp 98.4°F | Ht 64.0 in | Wt 212.3 lb

## 2023-12-21 DIAGNOSIS — J4540 Moderate persistent asthma, uncomplicated: Secondary | ICD-10-CM

## 2023-12-21 DIAGNOSIS — J3089 Other allergic rhinitis: Secondary | ICD-10-CM | POA: Diagnosis not present

## 2023-12-21 MED ORDER — ALBUTEROL SULFATE HFA 108 (90 BASE) MCG/ACT IN AERS
2.0000 | INHALATION_SPRAY | RESPIRATORY_TRACT | 1 refills | Status: AC | PRN
  Filled 2023-12-21: qty 6.7, 25d supply, fill #0
  Filled 2024-01-13: qty 6.7, 17d supply, fill #0

## 2023-12-21 MED ORDER — FLUTICASONE PROPIONATE 50 MCG/ACT NA SUSP
1.0000 | NASAL | 5 refills | Status: AC
  Filled 2023-12-21: qty 16, 30d supply, fill #0

## 2023-12-21 MED ORDER — BREO ELLIPTA 100-25 MCG/ACT IN AEPB
1.0000 | INHALATION_SPRAY | RESPIRATORY_TRACT | 5 refills | Status: AC
  Filled 2023-12-21: qty 60, 30d supply, fill #0

## 2023-12-21 NOTE — Progress Notes (Unsigned)
 [place] - [place] - [place] - [place] - [place]   Follow-up Note  Referring Provider: Perri Ronal PARAS, MD Primary Provider: Perri Ronal PARAS, MD Date of Office Visit: 12/21/2023  Subjective:   Jodi Bates (DOB: 1965/06/26) is a 58 y.o. female who [facility] in re-evaluation of the following:  HPI: [name] returns to this clinic in evaluation of asthma, allergic rhinoconjunctivitis, LPR.  I last saw her in this clinic for April 2023.  She was seen by our nurse practitioner 22 Jun 2023.  She continues to do very well with both her upper and lower airway while intermittently using her anti-inflammatory medications including Flonase  and Breo.  It appears that most of her use occurs during the spring and the fall season but it is still very sporadic.  Utilizing this plan she has not had the need to use a systemic steroid or an antibiotic for any type of airway issue and she can exercise with no problem and rarely uses a short acting bronchodilator.  Allergies as of 12/21/2023       Reactions   Adhesive [tape] Itching, Other (See Comments)   Reaction:  Blisters    Latex Itching, Rash, Other (See Comments)   Soap Other (See Comments)   Soaps that contain any kind of fragrance triggers an asthma attack.     Hydrochlorothiazide     Hypokalemia   Statins Other (See Comments)   Muscle ache with rosuvastatin  and atorvastatin   Other Cough   Fragrances        Medication List    albuterol  (2.5 MG/3ML) 0.083% nebulizer solution Commonly known as: PROVENTIL  Take 3 mLs by nebulization every 6 (six) hours as needed for wheezing or shortness of breath.   albuterol  108 (90 Base) MCG/ACT inhaler Commonly known as: VENTOLIN  HFA Inhale 2 puffs into the lungs every 4 (four) hours as needed for wheezing or shortness of breath.   ALPRAZolam  0.5 MG tablet Commonly known as: XANAX  Take 1 tablet (0.5 mg total) by mouth 2 (two) times  daily as needed.   amLODipine  10 MG tablet Commonly known as: NORVASC  Take 1 tablet (10 mg total) by mouth daily.   Breo Ellipta  100-25 MCG/ACT Aepb Generic drug: fluticasone  furoate-vilanterol Inhale 1 puff into the lungs 1 to 7 times per week as needed   escitalopram  20 MG tablet Commonly known as: LEXAPRO  Take 1 tablet (20 mg total) by mouth daily.   fluticasone  50 MCG/ACT nasal spray Commonly known as: FLONASE  Place 1-2 sprays into both nostrils 1 to 7 times per week as needed   latanoprost  0.005 % ophthalmic solution Commonly known as: XALATAN  Place 1 drop into both eyes at bedtime.   loratadine  10 MG tablet Commonly known as: CLARITIN  TAKE 1 TABLET (10 MG TOTAL) BY MOUTH DAILY.   losartan  50 MG tablet Commonly known as: COZAAR  Take 1 tablet (50 mg total) by mouth daily.   Nexlizet  180-10 MG Tabs Generic drug: Bempedoic Acid -Ezetimibe  Take 1 tablet by mouth daily.   spironolactone  25 MG tablet Commonly known as: Aldactone  Take 1 tablet (25 mg total) by mouth daily.   SUMAtriptan  25 MG tablet Commonly known as: Imitrex  Take 1 tablet (25 mg total) by mouth every 2 (two) hours as needed for migraine. May repeat in 2 hours if headache persists or recurs.   topiramate  25 MG tablet Commonly known as: TOPAMAX  Take 2 tablets (50 mg total) by mouth at bedtime.   Vitamin D  (  Ergocalciferol ) 1.25 MG (50000 UNIT) Caps capsule Commonly known as: DRISDOL  Take 1 capsule (50,000 Units total) by mouth every 7 (seven) days.    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 > 5 yr.   Iron deficiency anemia    no  current problems or meds.   Migraine headache with aura 04/09/2020   Morbid obesity (HCC) 11/10/2020   Prediabetes 02/14/2021    Past Surgical History:  Procedure Laterality Date   BREAST CYST EXCISION     BREAST EXCISIONAL BIOPSY Left    BREAST SURGERY      lt breast/ benign cyst   CYST EXCISION     back of head   HYSTEROSCOPY WITH D & C  06/25/2004   INCISION AND DRAINAGE PERIRECTAL ABSCESS N/A 02/10/2013   Procedure: IRRIGATION AND DEBRIDEMENT PERIRECTAL ABSCESS;  Surgeon: Lynwood MALVA Pina, MD;  Location: [facility];  Service: General;  Laterality: N/A;   LAPAROSCOPIC ENDOMETRIOSIS FULGURATION     PILONIDAL CYST EXCISION N/A 04/28/2012   Procedure: exam under anesthesia and exicision of pilonidal;  Surgeon: Lynwood MALVA Pina, MD;  [facility];  Service: General;  Laterality: N/A;   POLYPECTOMY     uterine   TUBAL LIGATION      Review of systems negative except as noted in HPI / PMHx or noted below:  Review of Systems  Constitutional: Negative.   HENT: Negative.    Eyes: Negative.   Respiratory: Negative.    Cardiovascular: Negative.   Gastrointestinal: Negative.   Genitourinary: Negative.   Musculoskeletal: Negative.   Skin: Negative.   Neurological: Negative.   Endo/Heme/Allergies: Negative.   Psychiatric/Behavioral: Negative.       Objective:   Vitals:   12/21/23 0902  BP: 120/80  Temp: 98.4 F (36.9 C)   Height: 5' 4 (162.6 cm)  Weight: 212 lb 4.8 oz (96.3 kg)   Physical Exam Constitutional:      Appearance: She is not diaphoretic.  HENT:     Head: Normocephalic.     Right Ear: Tympanic membrane, ear canal and external ear normal.     Left Ear: Tympanic membrane, ear canal and external ear normal.     Nose: Nose normal. No mucosal edema or rhinorrhea.     Mouth/Throat:     Pharynx: Uvula midline. No oropharyngeal exudate.  Eyes:     Conjunctiva/sclera: Conjunctivae normal.  Neck:     Thyroid : No thyromegaly.     Trachea: Trachea  normal. No tracheal tenderness or tracheal deviation.  Cardiovascular:     Rate and Rhythm: Normal rate and regular rhythm.     Heart sounds: S1 normal and S2 normal. Murmur (systolic) heard.  Pulmonary:     Effort: No respiratory distress.     Breath sounds: Normal breath sounds. No stridor. No wheezing or rales.  Lymphadenopathy:     Head:     Right side of head: No tonsillar adenopathy.     Left side of head: No tonsillar adenopathy.     Cervical: No cervical adenopathy.  Skin:  Findings: No erythema or rash.     Nails: There is no clubbing.  Neurological:     Mental Status: She is alert.     Diagnostics: Spirometry was performed and demonstrated an FEV1 of 1.93 at 88 % of predicted.  Assessment and Plan:   1. Moderate persistent asthma without complication   2. Perennial allergic rhinitis    1. Continue to Treat and prevent inflammation:   A. Flonase  1-2 sprays each nostril 1-7 times per week depending on disease activity  B. Breo 100 - 1 inhalation 1- 7 times per week depending on disease activity  2. If needed:   A. Ventolin  HFA 2 puffs every 4-6 hours  B. OTC antihistamine - Claritin /Allegra /Zyrtec  3.  Return to clinic in 12 months or earlier if problem  4. Influenza = Tamiflu. Covid = Paxlovid  Anahi is doing very well.  She has a very good understanding of her disease state and how her medications work and appropriate dosing of her medications depending on disease activity.  I have refilled her medications and she is welcome to return to this clinic in 1 year or earlier if there is a problem with this plan.  Camellia Denis, MD [facility]

## 2023-12-21 NOTE — Patient Instructions (Addendum)
  1. Continue to Treat and prevent inflammation:   A. Flonase  1-2 sprays each nostril 1-7 times per week depending on disease activity  B. Breo 100 - 1 inhalation 1- 7 times per week depending on disease activity  2. If needed:   A. Ventolin  HFA 2 puffs every 4-6 hours  B. OTC antihistamine - Claritin /Allegra /Zyrtec  3.  Return to clinic in 12 months or earlier if problem  4. Influenza = Tamiflu. Covid = Paxlovid

## 2023-12-22 ENCOUNTER — Other Ambulatory Visit (HOSPITAL_COMMUNITY): Payer: Self-pay

## 2023-12-22 ENCOUNTER — Encounter: Payer: Self-pay | Admitting: Allergy and Immunology

## 2023-12-28 ENCOUNTER — Ambulatory Visit (INDEPENDENT_AMBULATORY_CARE_PROVIDER_SITE_OTHER): Payer: Self-pay | Admitting: Family Medicine

## 2023-12-28 ENCOUNTER — Encounter (INDEPENDENT_AMBULATORY_CARE_PROVIDER_SITE_OTHER): Payer: Self-pay | Admitting: Family Medicine

## 2023-12-28 ENCOUNTER — Other Ambulatory Visit (HOSPITAL_COMMUNITY): Payer: Self-pay

## 2023-12-28 VITALS — BP 100/67 | HR 71 | Temp 97.8°F | Ht 64.0 in | Wt 210.0 lb

## 2023-12-28 DIAGNOSIS — E669 Obesity, unspecified: Secondary | ICD-10-CM

## 2023-12-28 DIAGNOSIS — E785 Hyperlipidemia, unspecified: Secondary | ICD-10-CM

## 2023-12-28 DIAGNOSIS — Z6836 Body mass index (BMI) 36.0-36.9, adult: Secondary | ICD-10-CM | POA: Diagnosis not present

## 2023-12-28 DIAGNOSIS — E782 Mixed hyperlipidemia: Secondary | ICD-10-CM

## 2023-12-28 DIAGNOSIS — E559 Vitamin D deficiency, unspecified: Secondary | ICD-10-CM

## 2023-12-28 MED ORDER — VITAMIN D (ERGOCALCIFEROL) 1.25 MG (50000 UNIT) PO CAPS
50000.0000 [IU] | ORAL_CAPSULE | ORAL | 0 refills | Status: DC
  Filled 2023-12-28: qty 4, 28d supply, fill #0

## 2023-12-28 NOTE — Progress Notes (Signed)
 Office: [phone]  /  Fax: [phone]  WEIGHT SUMMARY AND BIOMETRICS  Anthropometric Measurements Height: 5' 4 (1.626 m) Weight: 210 lb (95.3 kg) BMI (Calculated): 36.03 Weight at Last Visit: 214 lb Weight Lost Since Last Visit: 4 lb Weight Gained Since Last Visit: 0 Starting Weight: 229 lb Total Weight Loss (lbs): 19 lb (8.618 kg) Peak Weight: 248 lb   Body Composition  Body Fat %: 43.9 % Fat Mass (lbs): 92.6 lbs Muscle Mass (lbs): 112.2 lbs Total Body Water (lbs): 77 lbs Visceral Fat Rating : 13   Other Clinical Data Fasting: no Labs: no Today's Visit #: 13 Starting Date: 03/16/23    Chief Complaint: OBESITY    History of Present Illness Jodi Bates is a 58 year old female who presents for obesity treatment and progress assessment.  She is adhering to a category two eating plan 90% of the time and has incorporated walking as exercise, engaging in 30-minute sessions three times a week, both indoors and outdoors. She has lost four pounds in the past month, including over Thanksgiving, by focusing on protein intake and avoiding foods that upset her stomach, such as those containing milk.  She is managing a vitamin D  deficiency with prescription ergocalciferol  at a dose of 50,000 IU per week. Her most recent vitamin D  level was at goal, recorded at 65.3 in July of this year.  She is on medication for cholesterol management with the lipid clinic, with a recheck scheduled for the end of next month. No issues with this medication. Medication is not in Epic, but may be Nexlizet .      PHYSICAL EXAM:  Blood pressure 100/67, pulse 71, temperature 97.8 F (36.6 C), height 5' 4 (1.626 m), weight 210 lb (95.3 kg), last menstrual period 06/11/2016, SpO2 98%. Body mass index is 36.05 kg/m.  DIAGNOSTIC DATA REVIEWED:  BMET    Component Value Date/Time   NA 142 10/26/2023 1209   K 4.4 10/26/2023 1209   CL 107 (H) 10/26/2023 1209   CO2 23 10/26/2023  1209   GLUCOSE 103 (H) 10/26/2023 1209   GLUCOSE 123 (H) 02/01/2021 1308   BUN 13 10/26/2023 1209   CREATININE 0.81 10/26/2023 1209   CREATININE 0.73 12/14/2019 1602   CALCIUM  8.9 10/26/2023 1209   GFRNONAA >60 02/01/2021 1308   GFRNONAA 93 12/14/2019 1602   GFRAA 108 12/14/2019 1602   Lab Results  Component Value Date   HGBA1C 5.4 08/10/2023   HGBA1C 5.9 (H) 12/29/2011   Lab Results  Component Value Date   INSULIN  8.2 08/10/2023   INSULIN  10.1 03/17/2023   Lab Results  Component Value Date   TSH 2.420 03/17/2023   CBC    Component Value Date/Time   WBC 3.2 (L) 03/17/2023 1200   WBC 5.6 02/01/2021 1308   RBC 4.47 03/17/2023 1200   RBC 4.62 02/01/2021 1308   HGB 13.5 03/17/2023 1200   HGB 12.2 10/16/2008 0914   HCT 41.3 03/17/2023 1200   HCT 36.1 10/16/2008 0914   PLT 195 03/17/2023 1200   MCV 92 03/17/2023 1200   MCV 86.4 10/16/2008 0914   MCH 30.2 03/17/2023 1200   MCH 30.3 02/01/2021 1308   MCHC 32.7 03/17/2023 1200   MCHC 33.3 02/01/2021 1308   RDW 12.2 03/17/2023 1200   RDW 13.9 10/16/2008 0914   Iron Studies    Component Value Date/Time   IRON 98 12/29/2011 1654   TIBC 369 12/29/2011 1654   FERRITIN 11 10/16/2008 0914  IRONPCTSAT 27 12/29/2011 1654   Lipid Panel     Component Value Date/Time   CHOL 168 10/26/2023 1209   TRIG 50 10/26/2023 1209   HDL 50 10/26/2023 1209   CHOLHDL 3.4 10/26/2023 1209   CHOLHDL 3.2 03/25/2015 0905   VLDL 16 03/25/2015 0905   LDLCALC 108 (H) 10/26/2023 1209   Hepatic Function Panel     Component Value Date/Time   PROT 6.4 10/26/2023 1209   ALBUMIN 4.1 10/26/2023 1209   AST 16 10/26/2023 1209   ALT 12 10/26/2023 1209   ALKPHOS 83 10/26/2023 1209   BILITOT 0.3 10/26/2023 1209   BILIDIR 0.17 04/06/2023 1059      Component Value Date/Time   TSH 2.420 03/17/2023 1200   Nutritional Lab Results  Component Value Date   VD25OH 65.3 08/10/2023   VD25OH 25.5 (L) 03/17/2023   VD25OH 42 03/25/2015      Assessment and Plan Assessment & Plan Obesity Obesity management is progressing well with a weight loss of four pounds in the last month, including over Thanksgiving. She adheres to the category two eating plan 90% of the time and engages in walking for 30 minutes, three days a week. She effectively manages dietary temptations during holidays by focusing on protein and vegetables, and she is mindful of portion control. She anticipates managing December challenges with similar strategies. - Continue category two eating plan - Continue walking for 30 minutes, three days a week  Vitamin D  deficiency Well-managed with ergocalciferol  50,000 IU weekly. The most recent vitamin D  level was at goal at 65.3 in July. - Continue ergocalciferol  50,000 IU weekly  Hyperlipidemia Managed with medication, and she reports no issues. Cholesterol levels are scheduled for recheck at the end of next month. - Continue current cholesterol medication - Will recheck cholesterol levels at the end of next month - Continue diet, exercise and weight loss as discussed today as an important part of the treatment plan       Patients who are on anti-obesity medications are counseled on the importance of maintaining healthy lifestyle habits, including balanced nutrition, regular physical activity, and behavioral modifications,  Medication is an adjunct to, not a replacement for, lifestyle changes and that the long-term success and weight maintenance depend on continued adherence to these strategies.   Estephani was informed of the importance of frequent follow up visits to maximize her success with intensive lifestyle modifications for her obesity and obesity related health conditions as recommended by USPSTF and CMS guidelines [name], MD

## 2024-01-13 ENCOUNTER — Other Ambulatory Visit (HOSPITAL_COMMUNITY): Payer: Self-pay

## 2024-01-14 ENCOUNTER — Other Ambulatory Visit (HOSPITAL_COMMUNITY): Payer: Self-pay

## 2024-01-14 ENCOUNTER — Encounter: Payer: Self-pay | Admitting: Allergy

## 2024-01-14 ENCOUNTER — Ambulatory Visit: Admitting: Allergy

## 2024-01-14 ENCOUNTER — Other Ambulatory Visit: Payer: Self-pay

## 2024-01-14 VITALS — BP 110/62 | HR 90 | Temp 98.3°F | Resp 16

## 2024-01-14 DIAGNOSIS — J989 Respiratory disorder, unspecified: Secondary | ICD-10-CM

## 2024-01-14 DIAGNOSIS — J3089 Other allergic rhinitis: Secondary | ICD-10-CM | POA: Diagnosis not present

## 2024-01-14 DIAGNOSIS — J4541 Moderate persistent asthma with (acute) exacerbation: Secondary | ICD-10-CM | POA: Diagnosis not present

## 2024-01-14 MED ORDER — AZITHROMYCIN 250 MG PO TABS
ORAL_TABLET | ORAL | 0 refills | Status: AC
  Filled 2024-01-14: qty 6, 5d supply, fill #0

## 2024-01-14 MED ORDER — PREDNISONE 20 MG PO TABS
20.0000 mg | ORAL_TABLET | Freq: Every day | ORAL | 0 refills | Status: AC
  Filled 2024-01-14: qty 5, 5d supply, fill #0

## 2024-01-14 NOTE — Progress Notes (Signed)
 "   Follow-up Note  RE: Jodi Bates MRN: [id number] DOB: April 24, 1965 Date of Office Visit: 01/14/2024   History of present illness: Jodi Bates is a 58 y.o. female presenting today for acute visit.  She was last seen in the office on 12/21/2023 by Dr. Kozlow for asthma and allergic rhinitis. Discussed the use of AI scribe software for clinical note transcription with the patient, who gave verbal consent to proceed.  Symptoms began on Tuesday after caring for her sick grandson. She has a dry cough, rhinorrhea, chills, fatigue, and myalgias. No pharyngitis or fever, as she has not measured her temperature at home.  She has not performed any at home swabs or respiratory viral tests.  She has been using her albuterol  inhaler for the cough but had run out and ot it refilled this morning and used this morning already. She continues to use her Breo inhaler, one puff once a day in the morning, and Flonase  as usual. She has not used any over-the-counter medications except for vitamin C due to concerns about her history of hypertension.  Review of systems: 10pt ROS negative unless noted above in HPI   Past medical/social/surgical/family history have been reviewed and are unchanged unless specifically indicated below.  No changes  Medication List: Current Outpatient Medications  Medication Sig Dispense Refill   albuterol  (PROVENTIL ) (2.5 MG/3ML) 0.083% nebulizer solution Take 3 mLs by nebulization every 6 (six) hours as needed for wheezing or shortness of breath. 75 mL 1   albuterol  (VENTOLIN  HFA) 108 (90 Base) MCG/ACT inhaler Inhale 2 puffs into the lungs every 4 (four) hours as needed for wheezing or shortness of breath. 6.7 g 1   ALPRAZolam  (XANAX ) 0.5 MG tablet Take 1 tablet (0.5 mg total) by mouth 2 (two) times daily as needed. 60 tablet 2   amLODipine  (NORVASC ) 10 MG tablet Take 1 tablet (10 mg total) by mouth daily. 90 tablet 3   azithromycin  (ZITHROMAX ) 250 MG tablet Take  2 tablets (500 mg total) by mouth daily for 1 day, THEN 1 tablet (250 mg total) daily for 4 days. 6 tablet 0   Bempedoic Acid -Ezetimibe  (NEXLIZET ) 180-10 MG TABS Take 1 tablet by mouth daily. 30 tablet 6   BREO ELLIPTA  100-25 MCG/ACT AEPB Inhale 1 puff into the lungs 1 to 7 times per week as needed 60 each 5   escitalopram  (LEXAPRO ) 20 MG tablet Take 1 tablet (20 mg total) by mouth daily. 90 tablet 3   fluticasone  (FLONASE ) 50 MCG/ACT nasal spray Place 1-2 sprays into both nostrils 1 to 7 times per week as needed 16 g 5   latanoprost  (XALATAN ) 0.005 % ophthalmic solution Place 1 drop into both eyes at bedtime. 2.5 mL 11   loratadine  (CLARITIN ) 10 MG tablet TAKE 1 TABLET (10 MG TOTAL) BY MOUTH DAILY. 30 tablet 5   losartan  (COZAAR ) 50 MG tablet Take 1 tablet (50 mg total) by mouth daily. 90 tablet 3   predniSONE  (DELTASONE ) 20 MG tablet Take 1 tablet (20 mg total) by mouth daily with breakfast for 5 days. 5 tablet 0   spironolactone  (ALDACTONE ) 25 MG tablet Take 1 tablet (25 mg total) by mouth daily. 90 tablet 3   SUMAtriptan  (IMITREX ) 25 MG tablet Take 1 tablet (25 mg total) by mouth every 2 (two) hours as needed for migraine. May repeat in 2 hours if headache persists or recurs. 10 tablet 5   topiramate  (TOPAMAX ) 25 MG tablet Take 2 tablets (50 mg total) by mouth  at bedtime. 180 tablet 3   Vitamin D , Ergocalciferol , (DRISDOL ) 1.25 MG (50000 UNIT) CAPS capsule Take 1 capsule (50,000 Units total) by mouth every 7 (seven) days. 4 capsule 0   No current facility-administered medications for this visit.     Known medication allergies: Allergies[1]   Physical examination: Blood pressure 110/62, pulse 90, temperature 98.3 F (36.8 C), temperature source Temporal, resp. rate 16, last menstrual period 06/11/2016, SpO2 96%.  General: interactive, appears fatigued. HEENT: PERRLA, TMs pearly gray, turbinates markedly edematous without discharge, post-pharynx non erythematous. Neck: Supple without  lymphadenopathy. Lungs: Clear to auscultation without wheezing, rhonchi or rales. {no increased work of breathing. CV: Normal S1, S2 without murmurs. Abdomen: Nondistended, nontender. Skin: Warm and dry, without lesions or rashes. Extremities:  No clubbing, cyanosis or edema. Neuro:   Grossly intact.  Diagnostics/Labs: None today  Assessment and plan: Acute Upper Respiratory Illness Moderate persistent asthma with exacerbation  Perennial allergic rhinitis     1. Continue to Treat and prevent inflammation:   A. Flonase  1-2 sprays each nostril 1-7 times per week depending on disease activity  B. Breo 100 - 1 inhalation 1- 7 times per week depending on disease activity  2. If needed:   A. Ventolin  HFA 2 puffs every 4-6 hours  B. OTC antihistamine - Claritin /Allegra /Zyrtec  3. For current illness:  A. Azithromycin  2 tabs day 1, then 1 tab day 2-5  B. Prednisone  20mg  daily for next 5 days  C. Monitor for fevers and fever control if needed  D.  Get adequate rest and stay hydrated with plenty of fluids  4.  Return to clinic in 12 months or earlier if problem  5. Influenza = Tamiflu. Covid = Paxlovid   I appreciate the opportunity to take part in Jodi Bates's care. Please do not hesitate to contact me with questions.  Sincerely,   Danita Brain, MD Allergy/Immunology [facility]       [1]  Allergies Allergen Reactions   Adhesive [Tape] Itching and Other (See Comments)    Reaction:  Blisters    Latex Itching, Rash and Other (See Comments)   Soap Other (See Comments)    Soaps that contain any kind of fragrance triggers an asthma attack.     Hydrochlorothiazide      Hypokalemia   Statins Other (See Comments)    Muscle ache with rosuvastatin  and atorvastatin   Other Cough    Fragrances    "

## 2024-01-14 NOTE — Patient Instructions (Addendum)
" °  1. Continue to Treat and prevent inflammation:   A. Flonase  1-2 sprays each nostril 1-7 times per week depending on disease activity  B. Breo 100 - 1 inhalation 1- 7 times per week depending on disease activity  2. If needed:   A. Ventolin  HFA 2 puffs every 4-6 hours  B. OTC antihistamine - Claritin /Allegra /Zyrtec  3. For current illness:  A. Azithromycin 2 tabs day 1, then 1 tab day 2-5  B. Prednisone  20mg  daily for next 5 days  C. Monitor for fevers and fever control if needed  D.  Get adequate rest and stay hydrated with plenty of fluids  4.  Return to clinic in 12 months or earlier if problem  5. Influenza = Tamiflu. Covid = Paxlovid   "

## 2024-01-18 ENCOUNTER — Encounter: Payer: Self-pay | Admitting: Dermatology

## 2024-01-18 ENCOUNTER — Ambulatory Visit: Admitting: Dermatology

## 2024-01-18 VITALS — BP 121/70 | HR 57

## 2024-01-18 DIAGNOSIS — L732 Hidradenitis suppurativa: Secondary | ICD-10-CM

## 2024-01-18 DIAGNOSIS — L821 Other seborrheic keratosis: Secondary | ICD-10-CM

## 2024-01-18 NOTE — Progress Notes (Signed)
 "  New Patient Visit   Subjective   History of Present Illness Gibson Telleria is a 58 year old female who presents with a spot on her chest for dermatological evaluation. She was referred by her gynecologist for evaluation of a spot on her chest.  She has had a spot on her chest for several years, though she is unsure of the exact duration. The spot has not changed in size or appearance. No associated symptoms such as itching, bleeding, or pain. The spot was noticed by her gynecologist during a breast exam, prompting the referral for dermatological evaluation.  She has a history of hidradenitis suppurativa, with previous surgical interventions for lesions. She experiences flares approximately every six months, which have decreased in frequency over time. She has undergone surgeries for lesions in various locations, including under her arms. She has not been on medication for this condition but uses Hibiclens  as a body wash. Her flares used to be related to her menstrual cycle, but since undergoing menopause, the frequency of flares has reduced.  Patient states she has lesion located at the breast that she would like to have examined. Patient reports the areas have been there for years, referred by gynecologist. She reports the areas are not bothersome. Patient reports she has previously been treated for these areas. Patient denies Hx of bx. Patient denies family history of skin cancer(s).  Patient states that she has been diagnosed with hidradenitis suppurativa and has had surgery on her breast for tracks that develop. She has used hibiclens  in the shower. Patient does not use any topical or oral antibiotics.    The following portions of the chart were reviewed this encounter and updated as appropriate: medications, allergies, medical history  Review of Systems:  No other skin or systemic complaints except as noted in HPI or Assessment and Plan.  Objective  Well appearing patient in no  apparent distress; mood and affect are within normal limits.  A focused examination was performed of the following areas: R breast  Axillae Inframammary region Relevant exam findings are noted in the Assessment and Plan.   Assessment & Plan   SEBORRHEIC KERATOSIS - Stuck-on, waxy, tan-brown papules and/or plaques  - Benign-appearing - Discussed benign etiology and prognosis. - Observe - Call for any changes  HIDRADENITIS SUPPURATIVA- not at treatment foal Exam: no flares today. Patient has had flares on her breasts, under arms, and on her back.   Well Controlled  Hidradenitis Suppurativa is a chronic; persistent; non-curable, but treatable condition due to abnormal inflamed sweat glands in the body folds (axilla, inframammary, groin, medial thighs), causing recurrent painful draining cysts and scarring. It can be associated with severe scarring acne and cysts; also abscesses and scarring of scalp. The goal is control and prevention of flares, as it is not curable. Scars are permanent and can be thickened. Treatment may include daily use of topical medication and oral antibiotics.  Oral isotretinoin may also be helpful.  For some cases, Humira or Cosentyx (biologic injections) may be prescribed to decrease the inflammatory process and prevent flares.  When indicated, inflamed cysts may also be treated surgically.  Treatment Plan: Discussed that we would be able to start topical or oral antibiotics to use during her next flare. Patient declines at this time.  Encouraged patient to schedule follow up with one of our other providers who treat this condition and consider biologic discussion.   Return for HS follow up with Dr. Alm or Erminio. .  I,  Rollene Gobble, RN, am acting as scribe for RUFUS CHRISTELLA HOLY, MD .  We spent 45 min reviewing records, taking the patient history, providing face to face care with the patient, sending prescriptions.   Documentation: I have reviewed the  above documentation for accuracy and completeness, and I agree with the above.  RUFUS CHRISTELLA HOLY, MD     "

## 2024-01-18 NOTE — Patient Instructions (Signed)

## 2024-01-24 ENCOUNTER — Other Ambulatory Visit (HOSPITAL_COMMUNITY): Payer: Self-pay

## 2024-01-24 MED ORDER — ESCITALOPRAM OXALATE 20 MG PO TABS
20.0000 mg | ORAL_TABLET | Freq: Every day | ORAL | 3 refills | Status: AC
  Filled 2024-01-24: qty 90, 90d supply, fill #0

## 2024-02-07 ENCOUNTER — Other Ambulatory Visit (HOSPITAL_COMMUNITY): Payer: Self-pay

## 2024-02-08 ENCOUNTER — Ambulatory Visit (INDEPENDENT_AMBULATORY_CARE_PROVIDER_SITE_OTHER): Admitting: Family Medicine

## 2024-02-08 ENCOUNTER — Other Ambulatory Visit (HOSPITAL_COMMUNITY): Payer: Self-pay

## 2024-02-08 ENCOUNTER — Other Ambulatory Visit: Payer: Self-pay

## 2024-02-08 ENCOUNTER — Encounter (INDEPENDENT_AMBULATORY_CARE_PROVIDER_SITE_OTHER): Payer: Self-pay | Admitting: Family Medicine

## 2024-02-08 VITALS — BP 115/76 | HR 86 | Temp 97.9°F | Ht 64.0 in | Wt 208.0 lb

## 2024-02-08 DIAGNOSIS — R7303 Prediabetes: Secondary | ICD-10-CM

## 2024-02-08 DIAGNOSIS — E669 Obesity, unspecified: Secondary | ICD-10-CM | POA: Diagnosis not present

## 2024-02-08 DIAGNOSIS — E559 Vitamin D deficiency, unspecified: Secondary | ICD-10-CM | POA: Diagnosis not present

## 2024-02-08 DIAGNOSIS — Z6835 Body mass index (BMI) 35.0-35.9, adult: Secondary | ICD-10-CM | POA: Diagnosis not present

## 2024-02-08 MED ORDER — VITAMIN D (ERGOCALCIFEROL) 1.25 MG (50000 UNIT) PO CAPS
50000.0000 [IU] | ORAL_CAPSULE | ORAL | 0 refills | Status: AC
  Filled 2024-02-08 (×2): qty 4, 28d supply, fill #0

## 2024-02-08 NOTE — Progress Notes (Signed)
 "  Office: [phone]  /  Fax: [phone]  WEIGHT SUMMARY AND BIOMETRICS  Anthropometric Measurements Height: 5' 4 (1.626 m) Weight: 208 lb (94.3 kg) BMI (Calculated): 35.69 Weight at Last Visit: 210 lb Weight Lost Since Last Visit: 210 lb Weight Gained Since Last Visit: 2 lb Starting Weight: 229 lb Total Weight Loss (lbs): 21 lb (9.526 kg) Peak Weight: 248 lb   Body Composition  Body Fat %: 43.4 % Fat Mass (lbs): 90.6 lbs Muscle Mass (lbs): 112 lbs Total Body Water (lbs): 75.2 lbs Visceral Fat Rating : 12   Other Clinical Data Fasting: no Labs: no Today's Visit #: 14 Starting Date: 03/16/23    Chief Complaint: OBESITY    History of Present Illness Jodi Bates is a 59 year old female with obesity and prediabetes who presents for obesity treatment and progress assessment.  She is adhering to the category two eating plan 90% of the time and is working on improving her adherence after the holiday season. She has gained two pounds over the last month, which she attributes to the Christmas and New Year's holidays. She is focusing on getting back on track with her diet and exercise routine.  She engages in physical activity three to four days a week, including walking and strengthening exercises for about thirty minutes per session. Her recent illness, lasting about two weeks, affected her ability to maintain her exercise routine, but she is now increasing her activity level by working out at the gym, walking outside when weather permits, and using her home elliptical.  She is being treated for vitamin D  deficiency with prescription ergocalciferol  50,000 IU per week and requests a refill. Her prediabetes has shown improvement, with her A1c decreasing from 5.9 to 5.4, primarily through diet, exercise, and weight loss.      PHYSICAL EXAM:  Blood pressure 115/76, pulse 86, temperature 97.9 F (36.6 C), height 5' 4 (1.626 m), weight 208 lb (94.3 kg), last  menstrual period 06/11/2016, SpO2 96%. Body mass index is 35.7 kg/m.  DIAGNOSTIC DATA REVIEWED TODAY BY MYSELF:  BMET    Component Value Date/Time   NA 142 10/26/2023 1209   K 4.4 10/26/2023 1209   CL 107 (H) 10/26/2023 1209   CO2 23 10/26/2023 1209   GLUCOSE 103 (H) 10/26/2023 1209   GLUCOSE 123 (H) 02/01/2021 1308   BUN 13 10/26/2023 1209   CREATININE 0.81 10/26/2023 1209   CREATININE 0.73 12/14/2019 1602   CALCIUM  8.9 10/26/2023 1209   GFRNONAA >60 02/01/2021 1308   GFRNONAA 93 12/14/2019 1602   GFRAA 108 12/14/2019 1602   Lab Results  Component Value Date   HGBA1C 5.4 08/10/2023   HGBA1C 5.9 (H) 12/29/2011   Lab Results  Component Value Date   INSULIN  8.2 08/10/2023   INSULIN  10.1 03/17/2023   Lab Results  Component Value Date   TSH 2.420 03/17/2023   CBC    Component Value Date/Time   WBC 3.2 (L) 03/17/2023 1200   WBC 5.6 02/01/2021 1308   RBC 4.47 03/17/2023 1200   RBC 4.62 02/01/2021 1308   HGB 13.5 03/17/2023 1200   HGB 12.2 10/16/2008 0914   HCT 41.3 03/17/2023 1200   HCT 36.1 10/16/2008 0914   PLT 195 03/17/2023 1200   MCV 92 03/17/2023 1200   MCV 86.4 10/16/2008 0914   MCH 30.2 03/17/2023 1200   MCH 30.3 02/01/2021 1308   MCHC 32.7 03/17/2023 1200   MCHC 33.3 02/01/2021 1308   RDW 12.2 03/17/2023 1200  RDW 13.9 10/16/2008 0914   Iron Studies    Component Value Date/Time   IRON 98 12/29/2011 1654   TIBC 369 12/29/2011 1654   FERRITIN 11 10/16/2008 0914   IRONPCTSAT 27 12/29/2011 1654   Lipid Panel     Component Value Date/Time   CHOL 168 10/26/2023 1209   TRIG 50 10/26/2023 1209   HDL 50 10/26/2023 1209   CHOLHDL 3.4 10/26/2023 1209   CHOLHDL 3.2 03/25/2015 0905   VLDL 16 03/25/2015 0905   LDLCALC 108 (H) 10/26/2023 1209   Hepatic Function Panel     Component Value Date/Time   PROT 6.4 10/26/2023 1209   ALBUMIN 4.1 10/26/2023 1209   AST 16 10/26/2023 1209   ALT 12 10/26/2023 1209   ALKPHOS 83 10/26/2023 1209   BILITOT 0.3  10/26/2023 1209   BILIDIR 0.17 04/06/2023 1059      Component Value Date/Time   TSH 2.420 03/17/2023 1200   Nutritional Lab Results  Component Value Date   VD25OH 65.3 08/10/2023   VD25OH 25.5 (L) 03/17/2023   VD25OH 42 03/25/2015     Assessment and Plan Assessment & Plan Obesity Management is ongoing with a focus on dietary adherence and exercise. She is following the category two eating plan 90% of the time and engaging in physical activity 3-4 days a week for 30 minutes. Recent weight gain of 2 pounds over the last month, attributed to holiday period and illness. She is working on returning to her previous level of adherence and activity. - Continue category two eating plan. - Encouraged meal planning and prepping. - Provided packet of recipes to meet nutritional requirements. - Encouraged continuation of exercise regimen.  Prediabetes Improved with lifestyle modifications. Hemoglobin A1c decreased from 5.9 to 5.4, primarily due to diet, exercise, and weight loss. - Continue dietary and exercise regimen to manage prediabetes. Will continue to monitor.  Vitamin D  deficiency Managed with prescription ergocalciferol  50,000 IU weekly. She requests a refill. - Refilled ergocalciferol  prescription. Will continue to monitor.      Patients who are on anti-obesity medications are counseled on the importance of maintaining healthy lifestyle habits, including balanced nutrition, regular physical activity, and behavioral modifications,  Medication is an adjunct to, not a replacement for, lifestyle changes and that the long-term success and weight maintenance depend on continued adherence to these strategies.   [name] was informed of the importance of frequent follow up visits to maximize her success with intensive lifestyle modifications for her obesity and obesity related health conditions as recommended by USPSTF and CMS guidelines  [name], MD   "

## 2024-02-13 ENCOUNTER — Encounter (HOSPITAL_COMMUNITY): Payer: Self-pay

## 2024-02-14 ENCOUNTER — Other Ambulatory Visit (HOSPITAL_COMMUNITY): Payer: Self-pay

## 2024-02-15 ENCOUNTER — Other Ambulatory Visit (HOSPITAL_COMMUNITY): Payer: Self-pay

## 2024-02-15 ENCOUNTER — Ambulatory Visit: Admitting: Internal Medicine

## 2024-02-15 ENCOUNTER — Other Ambulatory Visit: Payer: Self-pay

## 2024-02-15 ENCOUNTER — Encounter: Payer: Self-pay | Admitting: Internal Medicine

## 2024-02-15 VITALS — BP 130/80 | HR 71 | Temp 98.0°F | Ht 62.25 in | Wt 215.7 lb

## 2024-02-15 DIAGNOSIS — J4540 Moderate persistent asthma, uncomplicated: Secondary | ICD-10-CM | POA: Diagnosis not present

## 2024-02-15 DIAGNOSIS — J3089 Other allergic rhinitis: Secondary | ICD-10-CM

## 2024-02-15 DIAGNOSIS — J069 Acute upper respiratory infection, unspecified: Secondary | ICD-10-CM | POA: Diagnosis not present

## 2024-02-15 MED ORDER — AZELASTINE HCL 0.1 % NA SOLN
2.0000 | Freq: Two times a day (BID) | NASAL | 5 refills | Status: AC | PRN
  Filled 2024-02-15: qty 30, 30d supply, fill #0

## 2024-02-15 MED ORDER — BUDESONIDE 0.5 MG/2ML IN SUSP
2.0000 mL | Freq: Two times a day (BID) | RESPIRATORY_TRACT | 0 refills | Status: AC
  Filled 2024-02-15: qty 60, 15d supply, fill #0

## 2024-02-15 NOTE — Progress Notes (Signed)
 "  FOLLOW UP Date of Service/Encounter:  02/15/24   Subjective:  Jodi Bates (DOB: 1965/04/30) is a 59 y.o. female who [facility] for follow up for an acute visit.   History obtained from: chart review and patient. Last seen on 01/14/2024 with Dr Jeneal for URI, started on prednisone  + Azithromycin .  Continue Flonase , Breo.    Notes since Saturday, started with nasal drainage, congestion, coughing.  No wheezing/dyspnea.  Taking Breo, has not needed Albuterol .  Using Flonase  and Claritin  also.  Does note getting better after the last visit but works in the hospital; has had sick contacts with coworkers. No fevers/muscle pain/purulent drainage/severe facial pain.  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 > 5 yr.   Iron deficiency anemia    no current problems or meds.   Migraine headache with aura 04/09/2020   Morbid obesity (HCC) 11/10/2020   Prediabetes 02/14/2021    Objective:  BP 130/80 (BP Location: Right Arm, Patient Position: Sitting, Cuff Size: Small)   Pulse 71   Temp 98 F (36.7 C) (Temporal)   Ht 5' 2.25 (1.581 m)   Wt 215 lb 11.2 oz (97.8 kg)   LMP 06/11/2016 Comment: irregular   SpO2 99%   BMI 39.14 kg/m  Body mass index is 39.14 kg/m. Physical Exam: GEN: alert, well developed HEENT: clear conjunctiva, nose with mild inferior turbinate hypertrophy, pink nasal mucosa, clear rhinorrhea, + cobblestoning HEART: regular rate and rhythm, no murmur LUNGS: clear to auscultation bilaterally, no coughing, unlabored  respiration SKIN: no rashes or lesions Assessment:   1. Viral URI with cough   2. Moderate persistent asthma without complication   3. Perennial allergic rhinitis     Plan/Recommendations:   1. Continue to Treat and prevent inflammation:   A. Flonase  2 sprays each nostril daily  B. Breo 100 - 1 inhalation daily  2. If needed:   A. Ventolin  HFA 2 puffs every 4-6 hours  B. OTC antihistamine - Claritin /Allegra /Zyrtec  C. Saline rinses + Azelastine  2 sprays each nostril twice daily  D. Pulmicort  0.5mg  twice daily nebulized for 2 weeks with respiratory illness or flare ups  4.  Keep follow up with Dr Kozlow as scheduled   5. Influenza = Tamiflu. Covid = Paxlovid   Viral Upper Respiratory Infections  Nasal congestion, post nasal dripping, or sinus pressure:   Nasal rinsing with saline nasal spray or salt water (e.g., Neilmed Sinus Rinse bottle) can help relieve nasal dryness.   Use a warm mist humidifier or take a steamy shower to increase moisture in the air and soothe oral and nasal tissues that become irritated with dry air.   Sore throat:  Use a pain reliever, such as acetaminophen  (Tylenol ) or ibuprofen (Advil).   Gargle with salt water (1/4 tsp of salt dissolved in 8 oz of warm water). Salt water can be used as often as you like.   Throat lozenges or throat sprays (Cepacol lozenges or Chloroseptic spray) may bring some relief by  increasing saliva production and lubricating your throat.   Drink plenty of fluids (e.g., water, diluted juice, tea) to soothe your throat and to stay well hydrated.   Coughing:   Medications that contain dextromethorphan (e.g., Robitussin DM, Mucinex DM, Delsym) may help to suppress a cough.   Tea with honey, when taken regularly, can soothe a sore throat and help suppress a cough. Take care with medications   Strengthen your immune system:   Make sure you eat well (e.g., a healthy, balanced variety of foods).   Avoid alcohol as it can directly  suppress various immune responses.   Stay away from cigarettes, and second hand smoke.   Rest as much as possible and get plenty of sleep (at least 8 hours).   Arleta Blanch, MD [facility]       "

## 2024-02-15 NOTE — Patient Instructions (Addendum)
" °  1. Continue to Treat and prevent inflammation:   A. Flonase  2 sprays each nostril daily  B. Breo 100 - 1 inhalation daily  2. If needed:   A. Ventolin  HFA 2 puffs every 4-6 hours  B. OTC antihistamine - Claritin /Allegra /Zyrtec  C. Saline rinses + Azelastine  2 sprays each nostril twice daily  D. Pulmicort  0.5mg  twice daily nebulized for 2 weeks with respiratory illness or flare ups  4.  Keep follow up with Dr Kozlow as scheduled   5. Influenza = Tamiflu. Covid = Paxlovid   Viral Upper Respiratory Infections  Nasal congestion, post nasal dripping, or sinus pressure:   Nasal rinsing with saline nasal spray or salt water (e.g., Neilmed Sinus Rinse bottle) can help relieve nasal dryness.   Use a warm mist humidifier or take a steamy shower to increase moisture in the air and soothe oral and nasal tissues that become irritated with dry air.   Sore throat:  Use a pain reliever, such as acetaminophen  (Tylenol ) or ibuprofen (Advil).   Gargle with salt water (1/4 tsp of salt dissolved in 8 oz of warm water). Salt water can be used as often as you like.   Throat lozenges or throat sprays (Cepacol lozenges or Chloroseptic spray) may bring some relief by increasing saliva production and lubricating your throat.   Drink plenty of fluids (e.g., water, diluted juice, tea) to soothe your throat and to stay well hydrated.   Coughing:   Medications that contain dextromethorphan (e.g., Robitussin DM, Mucinex DM, Delsym) may help to suppress a cough.   Tea with honey, when taken regularly, can soothe a sore throat and help suppress a cough. Take care with medications   Strengthen your immune system:   Make sure you eat well (e.g., a healthy, balanced variety of foods).   Avoid alcohol as it can directly suppress various immune responses.   Stay away from cigarettes, and second hand smoke.   Rest as much as possible and get plenty of sleep (at least 8 hours).  "

## 2024-02-17 ENCOUNTER — Encounter: Payer: Self-pay | Admitting: Internal Medicine

## 2024-03-01 LAB — LIPID PANEL
Chol/HDL Ratio: 2.1 ratio (ref 0.0–4.4)
Cholesterol, Total: 139 mg/dL (ref 100–199)
HDL: 65 mg/dL
LDL Chol Calc (NIH): 64 mg/dL (ref 0–99)
Triglycerides: 42 mg/dL (ref 0–149)
VLDL Cholesterol Cal: 10 mg/dL (ref 5–40)

## 2024-03-01 LAB — ALT: ALT: 33 [IU]/L — ABNORMAL HIGH (ref 0–32)

## 2024-03-02 NOTE — Telephone Encounter (Signed)
 The patient has been notified of the result and verbalized understanding.  All questions (if any) were answered.  Pt aware to limit her alcohol intake, tylenol  use, and any fried foods, for that can contribute to elevated liver enzymes.   Pt aware we will repeat LFTs in 3 months on her, and she should set herself a reminder to do this.  Pt aware this would be anytime in the beginning of May.   Pt verbalized understanding and agrees with this plan.

## 2024-03-02 NOTE — Addendum Note (Signed)
 Addended by: GLADIS PORTER HERO on: 03/02/2024 09:08 AM   Modules accepted: Orders

## 2024-03-02 NOTE — Telephone Encounter (Signed)
-----   Message from Reche Finder, NP sent at 03/01/2024  8:14 PM EST ----- Cholesterol much improved and now at goal. Very mildly elevated ALT. Recommend limiting alcohol, fried foods. Repeat liver enzyme panel in 3 months for monitoring.

## 2024-03-07 ENCOUNTER — Ambulatory Visit (INDEPENDENT_AMBULATORY_CARE_PROVIDER_SITE_OTHER): Admitting: Family Medicine

## 2024-04-04 ENCOUNTER — Ambulatory Visit (INDEPENDENT_AMBULATORY_CARE_PROVIDER_SITE_OTHER): Admitting: Family Medicine

## 2024-04-26 ENCOUNTER — Telehealth: Admitting: Neurology

## 2024-05-02 ENCOUNTER — Ambulatory Visit: Admitting: Physician Assistant

## 2024-12-19 ENCOUNTER — Ambulatory Visit: Admitting: Allergy and Immunology
